# Patient Record
Sex: Female | Born: 1965
Health system: Southern US, Community
[De-identification: ages and names within clinical notes are randomized; demographics above are authoritative.]

## PROBLEM LIST (undated history)

## (undated) DIAGNOSIS — M5126 Other intervertebral disc displacement, lumbar region: Secondary | ICD-10-CM

## (undated) DIAGNOSIS — K5909 Other constipation: Secondary | ICD-10-CM

## (undated) DIAGNOSIS — M8580 Other specified disorders of bone density and structure, unspecified site: Secondary | ICD-10-CM

## (undated) DIAGNOSIS — E669 Obesity, unspecified: Secondary | ICD-10-CM

## (undated) DIAGNOSIS — K219 Gastro-esophageal reflux disease without esophagitis: Secondary | ICD-10-CM

## (undated) DIAGNOSIS — M5416 Radiculopathy, lumbar region: Secondary | ICD-10-CM

## (undated) DIAGNOSIS — Z9884 Bariatric surgery status: Secondary | ICD-10-CM

## (undated) DIAGNOSIS — R079 Chest pain, unspecified: Secondary | ICD-10-CM

## (undated) DIAGNOSIS — M5432 Sciatica, left side: Secondary | ICD-10-CM

## (undated) HISTORY — DX: Radiculopathy, lumbar region: M54.16

## (undated) HISTORY — DX: Morbid (severe) obesity due to excess calories: E66.01

## (undated) HISTORY — DX: Other intervertebral disc displacement, lumbar region: M51.26

## (undated) HISTORY — DX: Gastro-esophageal reflux disease without esophagitis: K21.9

## (undated) HISTORY — DX: Chest pain, unspecified: R07.9

## (undated) HISTORY — DX: Bariatric surgery status: Z98.84

## (undated) HISTORY — PX: LUMBAR DISC SURGERY: SHX700

## (undated) HISTORY — DX: Other specified disorders of bone density and structure, unspecified site: M85.80

## (undated) HISTORY — DX: Obesity, unspecified: E66.9

## (undated) HISTORY — DX: Other constipation: K59.09

## (undated) HISTORY — DX: Sciatica, left side: M54.32

---

## 2012-10-27 HISTORY — PX: BREAST BIOPSY: SHX20

## 2012-10-27 HISTORY — PX: LAPAROSCOPIC GASTRIC SLEEVE RESECTION: SHX5895

## 2013-05-10 DIAGNOSIS — M5432 Sciatica, left side: Secondary | ICD-10-CM

## 2013-05-10 HISTORY — DX: Sciatica, left side: M54.32

## 2013-06-29 DIAGNOSIS — M5126 Other intervertebral disc displacement, lumbar region: Secondary | ICD-10-CM | POA: Insufficient documentation

## 2013-06-29 DIAGNOSIS — M5416 Radiculopathy, lumbar region: Secondary | ICD-10-CM

## 2013-06-29 HISTORY — DX: Other intervertebral disc displacement, lumbar region: M51.26

## 2013-06-29 HISTORY — DX: Radiculopathy, lumbar region: M54.16

## 2013-06-30 DIAGNOSIS — Z9884 Bariatric surgery status: Secondary | ICD-10-CM | POA: Insufficient documentation

## 2013-06-30 HISTORY — DX: Bariatric surgery status: Z98.84

## 2015-03-23 DIAGNOSIS — M8580 Other specified disorders of bone density and structure, unspecified site: Secondary | ICD-10-CM

## 2015-03-23 HISTORY — DX: Other specified disorders of bone density and structure, unspecified site: M85.80

## 2017-03-31 LAB — HM PAP SMEAR: HM PAP: NEGATIVE

## 2018-05-24 ENCOUNTER — Encounter: Payer: Self-pay | Admitting: Family

## 2018-05-24 ENCOUNTER — Ambulatory Visit (INDEPENDENT_AMBULATORY_CARE_PROVIDER_SITE_OTHER): Payer: No Typology Code available for payment source | Admitting: Family

## 2018-05-24 VITALS — BP 97/54 | HR 70 | Temp 98.2°F | Resp 16 | Ht 64.5 in | Wt 217.0 lb

## 2018-05-24 DIAGNOSIS — M519 Unspecified thoracic, thoracolumbar and lumbosacral intervertebral disc disorder: Secondary | ICD-10-CM

## 2018-05-24 DIAGNOSIS — K5909 Other constipation: Secondary | ICD-10-CM | POA: Insufficient documentation

## 2018-05-24 DIAGNOSIS — R079 Chest pain, unspecified: Secondary | ICD-10-CM

## 2018-05-24 DIAGNOSIS — K219 Gastro-esophageal reflux disease without esophagitis: Secondary | ICD-10-CM

## 2018-05-24 DIAGNOSIS — Z0000 Encounter for general adult medical examination without abnormal findings: Secondary | ICD-10-CM

## 2018-05-24 HISTORY — DX: Other constipation: K59.09

## 2018-05-24 HISTORY — DX: Gastro-esophageal reflux disease without esophagitis: K21.9

## 2018-05-24 HISTORY — DX: Chest pain, unspecified: R07.9

## 2018-05-24 HISTORY — DX: Morbid (severe) obesity due to excess calories: E66.01

## 2018-05-24 NOTE — Patient Instructions (Addendum)
You should be contacted about your referral to cardiology for further evaluation of your chest pain. Please let me know if you have not heard from them in the next 3 days.  Please go to the ER if you develop recurrent chest pain.  Increase water, fiber intake- you can also add a probiotic once daily for your constipation.  You can also try adding miralax 1 tablespoon once daily.  Increase to 2 tablespoons once daily if no improvement.

## 2018-05-24 NOTE — Progress Notes (Signed)
Subjective:    Patient ID: Melissa Santiago, female    DOB: 02-Apr-1966, 52 y.o.   MRN: [id number]  HPI  Pt is a 52 year old female who presents today to establish care.  Past medical history is significant for the following:  Morbid obesity- patient is status post gastric sleeve placement about 5 years ago.  GERD- she is maintained on omeprazole 40 mg once daily. Stable on omeprazole.   History of breast  Biopsy 2014- benign per patient. Mammogram due.   History of lumbar discectomy- l4-l5 x 2.  Gets occasional flare up of sciatica overall back pain is been stable.,   Notes chronic constipation- 4 bm's a week.  Has to strain. Tried a 7 day cleans without improvement and also a stool softener. This has been since her 50's.  Reports that she does not eat a lot of high fiber food.   Reports substernal chest pain, had some left arm numbness. Last episode 2 months ago.  This occurred at rest.  She reports that she has had a total of 6 episodes over the last 3 months.  She is wondering if this may be due to her reflux.  Patient  Review of Systems  Constitutional: Negative for unexpected weight change.  HENT: Negative for rhinorrhea.   Respiratory: Negative for cough and shortness of breath.   Cardiovascular: Positive for chest pain. Negative for leg swelling.  Gastrointestinal: Negative for blood in stool.  Genitourinary: Negative for dysuria, frequency and hematuria.  Skin: Negative for rash.  Neurological: Negative for headaches.  Hematological: Negative for adenopathy.  Psychiatric/Behavioral:       Denies depression/anxiety   Past Medical History:  Diagnosis Date  . GERD (gastroesophageal reflux disease)      Social History   Socioeconomic History  . Marital status: Married    Spouse name: [name]  . Number of children: 2  . Years of education: Not on file  . Highest education level: Not on file  Occupational History  . Occupation: Sales promotion account executive  Social Needs  .  Financial resource strain: Not hard at all  . Food insecurity:    Worry: Never true    Inability: Never true  . Transportation needs:    Medical: No    Non-medical: No  Tobacco Use  . Smoking status: Former Smoker    Packs/day: 0.50    Years: 15.00    Pack years: 7.50    Types: Cigarettes    Last attempt to quit: 2006    Years since quitting: 13.5  . Smokeless tobacco: Never Used  Substance and Sexual Activity  . Alcohol use: Yes    Comment: rarely  . Drug use: Never  . Sexual activity: Yes    Partners: Male    Birth control/protection: IUD  Lifestyle  . Physical activity:    Days per week: 0 days    Minutes per session: Not on file  . Stress: Only a little  Relationships  . Social connections:    Talks on phone: More than three times a week    Gets together: Never    Attends religious service: Never    Active member of club or organization: No    Attends meetings of clubs or organizations: Never    Relationship status: Married  . Intimate partner violence:    Fear of current or ex partner: No    Emotionally abused: No    Physically abused: No    Forced sexual activity: No  Other Topics  Concern  . Not on file  Social History Narrative   From [place], Divorced since 2015   Works as a Geophysical data processor for [employer]   2 daughters- one in the [employer]- lives in [place], one in [place]- works as a Pharmacist, hospital   Enjoys spending time with her husband   Has a dog and will adopt another soon.     Past Surgical History:  Procedure Laterality Date  . ANTERIOR CERVICAL DISCECTOMY  2014/2016  . BREAST BIOPSY Left 2014  . LAPAROSCOPIC GASTRIC SLEEVE RESECTION  2014    Family History  Problem Relation Age of Onset  . Arthritis Mother   . Early death Mother   . Mental illness Mother   . Alcohol abuse Father   . Arthritis Father   . Early death Sister   . Thyroid cancer Sister   . Hypertension Sister   . Heart attack Maternal Grandfather   . Uterine cancer  Sister   . Hypertension Sister   . Cervical cancer Sister   . Depression Sister   . Hypertension Sister     Allergies  Allergen Reactions  . Tolmetin     Current Outpatient Medications on File Prior to Visit  Medication Sig Dispense Refill  . Ibuprofen-Famotidine 800-26.6 MG TABS Take by mouth.    Marland Kitchen omeprazole (PRILOSEC) 40 MG capsule TAKE ONE CAPSULE BY MOUTH DAILY     No current facility-administered medications on file prior to visit.     BP (!) 97/54 (BP Location: Right Arm, Patient Position: Sitting, Cuff Size: Large)   Pulse 70   Temp 98.2 F (36.8 C) (Oral)   Resp 16   Ht 5' 4.5" (1.638 m)   Wt 217 lb (98.4 kg)   SpO2 100%   BMI 36.67 kg/m        Objective:   Physical Exam  Constitutional: She is oriented to person, place, and time. She appears well-developed and well-nourished.  HENT:  Head: Normocephalic and atraumatic.  Eyes: No scleral icterus.  Cardiovascular: Normal rate, regular rhythm and normal heart sounds.  No murmur heard. Pulmonary/Chest: Effort normal and breath sounds normal. No respiratory distress. She has no wheezes.  Musculoskeletal: She exhibits no edema.  Neurological: She is alert and oriented to person, place, and time.  Skin: Skin is warm and dry.  Psychiatric: She has a normal mood and affect. Her behavior is normal. Judgment and thought content normal.          Assessment & Plan:  Morbid obesity-she reports that she has maintained her weight for some time now.  She continues to work towards further weight loss.  GERD-stable on proton pump inhibitor continue same.  Atypical chest pain- patient does not have any exertional chest pain however the symptoms that she describes are concerning.  An EKG is performed today and personally reviewed.  There are no acute changes noted on EKG.  She is noted to have a short PR interval.  I have advised her that I would like for her to meet with cardiology for further evaluation and to proceed  directly to the ER should she have recurrent chest pain.  Patient verbalizes understanding.  Chronic constipation-patient is advised as follows:  Increase water, fiber intake- you can also add a probiotic once daily for your constipation.  You can also try adding miralax 1 tablespoon once daily.  Increase to 2 tablespoons once daily if no improvement.

## 2018-05-31 ENCOUNTER — Ambulatory Visit (HOSPITAL_BASED_OUTPATIENT_CLINIC_OR_DEPARTMENT_OTHER)
Admission: RE | Admit: 2018-05-31 | Discharge: 2018-05-31 | Disposition: A | Discharge status: Home / Self Care | Payer: PRIVATE HEALTH INSURANCE | Source: Ambulatory Visit | Attending: Family | Admitting: Family

## 2018-05-31 ENCOUNTER — Encounter (HOSPITAL_BASED_OUTPATIENT_CLINIC_OR_DEPARTMENT_OTHER): Payer: Self-pay | Admitting: Radiology

## 2018-05-31 DIAGNOSIS — Z0000 Encounter for general adult medical examination without abnormal findings: Secondary | ICD-10-CM | POA: Insufficient documentation

## 2018-06-08 ENCOUNTER — Encounter: Payer: Self-pay | Admitting: *Deleted

## 2018-06-10 ENCOUNTER — Encounter: Payer: Self-pay | Admitting: Cardiology

## 2018-06-10 ENCOUNTER — Ambulatory Visit (INDEPENDENT_AMBULATORY_CARE_PROVIDER_SITE_OTHER): Payer: No Typology Code available for payment source | Admitting: Cardiology

## 2018-06-10 VITALS — BP 98/62 | HR 70 | Ht 64.5 in | Wt 219.0 lb

## 2018-06-10 DIAGNOSIS — R0602 Shortness of breath: Secondary | ICD-10-CM

## 2018-06-10 DIAGNOSIS — R7989 Other specified abnormal findings of blood chemistry: Secondary | ICD-10-CM | POA: Diagnosis not present

## 2018-06-10 DIAGNOSIS — Z8249 Family history of ischemic heart disease and other diseases of the circulatory system: Secondary | ICD-10-CM

## 2018-06-10 DIAGNOSIS — R079 Chest pain, unspecified: Secondary | ICD-10-CM

## 2018-06-10 DIAGNOSIS — K219 Gastro-esophageal reflux disease without esophagitis: Secondary | ICD-10-CM

## 2018-06-10 MED ORDER — MELOXICAM 15 MG PO TABS: 15.0000 mg | ORAL_TABLET | Freq: Every day | ORAL | 0 refills | Status: DC

## 2018-06-10 NOTE — Patient Instructions (Addendum)
Medication Instructions:  Your physician has recommended you make the following change in your medication:   START meloxicam (mobic) 15 mg daily for 2 weeks. If symptoms still persist, take this medication for a full 30 days.   Labwork: Your physician recommends that you return for lab work today: BMP.   Testing/Procedures: Non-Cardiac CT Angiography (CTA), is a special type of CT scan that uses a computer to produce multi-dimensional views of major blood vessels throughout the body. In CT angiography, a contrast material is injected through an IV to help visualize the blood vessels  Follow-Up: Your physician recommends that you schedule a follow-up appointment as needed if symptoms worsen or fail to improve.   If you need a refill on your cardiac medications before your next appointment, please call your pharmacy.   Thank you for choosing [facility]! Robyne Peers, RN [phone]        Costochondritis Costochondritis is swelling and irritation (inflammation) of the tissue (cartilage) that connects your ribs to your breastbone (sternum). This causes pain in the front of your chest. Usually, the pain:  Starts gradually.  Is in more than one rib.  This condition usually goes away on its own over time. Follow these instructions at home:  Do not do anything that makes your pain worse.  If directed, put ice on the painful area: ? Put ice in a plastic bag. ? Place a towel between your skin and the bag. ? Leave the ice on for 20 minutes, 2-3 times a day.  If directed, put heat on the affected area as often as told by your doctor. Use the heat source that your doctor tells you to use, such as a moist heat pack or a heating pad. ? Place a towel between your skin and the heat source. ? Leave the heat on for 20-30 minutes. ? Take off the heat if your skin turns bright red. This is very important if you cannot feel pain, heat, or cold. You may have a greater risk of  getting burned.  Take over-the-counter and prescription medicines only as told by your doctor.  Return to your normal activities as told by your doctor. Ask your doctor what activities are safe for you.  Keep all follow-up visits as told by your doctor. This is important. Contact a doctor if:  You have chills or a fever.  Your pain does not go away or it gets worse.  You have a cough that does not go away. Get help right away if:  You are short of breath. This information is not intended to replace advice given to you by your health care provider. Make sure you discuss any questions you have with your health care provider. Document Released: 03/31/2008 Document Revised: 05/02/2016 Document Reviewed: 02/06/2016 Elsevier Interactive Patient Education  2018 [employer].    Meloxicam tablets What is this medicine? MELOXICAM (mel OX i cam) is a non-steroidal anti-inflammatory drug (NSAID). It is used to reduce swelling and to treat pain. It may be used for osteoarthritis, rheumatoid arthritis, or juvenile rheumatoid arthritis. This medicine may be used for other purposes; ask your health care provider or pharmacist if you have questions. COMMON BRAND NAME(S): Mobic What should I tell my health care provider before I take this medicine? They need to know if you have any of these conditions: -bleeding disorders -cigarette smoker -coronary artery bypass graft (CABG) surgery within the past 2 weeks -drink more than 3 alcohol-containing drinks per day -heart disease -high blood  pressure -history of stomach bleeding -kidney disease -liver disease -lung or breathing disease, like asthma -stomach or intestine problems -an unusual or allergic reaction to meloxicam, aspirin, other NSAIDs, other medicines, foods, dyes, or preservatives -pregnant or trying to get pregnant -breast-feeding How should I use this medicine? Take this medicine by mouth with a full glass of water. Follow the  directions on the prescription label. You can take it with or without food. If it upsets your stomach, take it with food. Take your medicine at regular intervals. Do not take it more often than directed. Do not stop taking except on your doctor's advice. A special MedGuide will be given to you by the pharmacist with each prescription and refill. Be sure to read this information carefully each time. Talk to your pediatrician regarding the use of this medicine in children. While this drug may be prescribed for selected conditions, precautions do apply. Patients over 43 years old may have a stronger reaction and need a smaller dose. Overdosage: If you think you have taken too much of this medicine contact a poison control center or emergency room at once. NOTE: This medicine is only for you. Do not share this medicine with others. What if I miss a dose? If you miss a dose, take it as soon as you can. If it is almost time for your next dose, take only that dose. Do not take double or extra doses. What may interact with this medicine? Do not take this medicine with any of the following medications: -cidofovir -ketorolac This medicine may also interact with the following medications: -aspirin and aspirin-like medicines -certain medicines for blood pressure, heart disease, irregular heart beat -certain medicines for depression, anxiety, or psychotic disturbances -certain medicines that treat or prevent blood clots like warfarin, enoxaparin, dalteparin, apixaban, dabigatran, rivaroxaban -cyclosporine -digoxin -diuretics -methotrexate -other NSAIDs, medicines for pain and inflammation, like ibuprofen and naproxen -pemetrexed This list may not describe all possible interactions. Give your health care provider a list of all the medicines, herbs, non-prescription drugs, or dietary supplements you use. Also tell them if you smoke, drink alcohol, or use illegal drugs. Some items may interact with your  medicine. What should I watch for while using this medicine? Tell your doctor or healthcare professional if your symptoms do not start to get better or if they get worse. Do not take other medicines that contain aspirin, ibuprofen, or naproxen with this medicine. Side effects such as stomach upset, nausea, or ulcers may be more likely to occur. Many medicines available without a prescription should not be taken with this medicine. This medicine can cause ulcers and bleeding in the stomach and intestines at any time during treatment. This can happen with no warning and may cause death. There is increased risk with taking this medicine for a long time. Smoking, drinking alcohol, older age, and poor health can also increase risks. Call your doctor right away if you have stomach pain or blood in your vomit or stool. This medicine does not prevent heart attack or stroke. In fact, this medicine may increase the chance of a heart attack or stroke. The chance may increase with longer use of this medicine and in people who have heart disease. If you take aspirin to prevent heart attack or stroke, talk with your doctor or health care professional. What side effects may I notice from receiving this medicine? Side effects that you should report to your doctor or health care professional as soon as possible: -allergic reactions like  skin rash, itching or hives, swelling of the face, lips, or tongue -nausea, vomiting -signs and symptoms of a blood clot such as breathing problems; changes in vision; chest pain; severe, sudden headache; pain, swelling, warmth in the leg; trouble speaking; sudden numbness or weakness of the face, arm, or leg -signs and symptoms of bleeding such as bloody or black, tarry stools; red or dark-brown urine; spitting up blood or brown material that looks like coffee grounds; red spots on the skin; unusual bruising or bleeding from the eye, gums, or nose -signs and symptoms of liver injury like  dark yellow or brown urine; general ill feeling or flu-like symptoms; light-colored stools; loss of appetite; nausea; right upper belly pain; unusually weak or tired; yellowing of the eyes or skin -signs and symptoms of stroke like changes in vision; confusion; trouble speaking or understanding; severe headaches; sudden numbness or weakness of the face, arm, or leg; trouble walking; dizziness; loss of balance or coordination Side effects that usually do not require medical attention (report to your doctor or health care professional if they continue or are bothersome): -constipation -diarrhea -gas This list may not describe all possible side effects. Call your doctor for medical advice about side effects. You may report side effects to FDA at 1-800-FDA-1088. Where should I keep my medicine? Keep out of the reach of children. Store at room temperature between 15 and 30 degrees C (59 and 86 degrees F). Throw away any unused medicine after the expiration date. NOTE: This sheet is a summary. It may not cover all possible information. If you have questions about this medicine, talk to your doctor, pharmacist, or health care provider.  2018 Elsevier/Gold Standard (2015-11-14 19:28:16)

## 2018-06-10 NOTE — Progress Notes (Signed)
Cardiology Office Note:    Date:  06/11/2018   ID:  Melissa Santiago, DOB 24-Oct-1966, MRN [id number]  PCP:  [name], NP  Cardiologist:  Shirlee More, MD   Referring MD: [name], NP  ASSESSMENT:    1. Chest pain in adult   2. High serum high density lipoprotein (HDL)   3. Gastroesophageal reflux disease, esophagitis presence not specified   4. Family history of DVT   5. Family history of pulmonary embolism   6. Shortness of breath    PLAN:    In order of problems listed above:  1. Her clinical presentation is most consistent with costochondral pain reproducible on physical examination.  We are going to go ahead and initiate a nonsteroidal anti-inflammatory drug Mobic 15 mg daily for the next 2 weeks and if unimproved continue for 1 month.  She is taking a PPI for gastric protection.  If unimproved referral for PT modalities.  I do not tHink she requires an ischemia evaluation 2. She has a favorable lipid profile with a very high HDL 3. Continue her PPI 4. With shortness of breath and chest pain further evaluation referred for CTA regarding pulmonary embolism  Next appointment 4 weeks   Medication Adjustments/Labs and Tests Ordered: Current medicines are reviewed at length with the patient today.  Concerns regarding medicines are outlined above.  Orders Placed This Encounter  Procedures  . CT ANGIO CHEST PE W OR WO CONTRAST  . Basic Metabolic Panel (BMET)   Meds ordered this encounter  Medications  . meloxicam (MOBIC) 15 MG tablet    Sig: Take 1 tablet (15 mg total) by mouth daily.    Dispense:  30 tablet    Refill:  0     Chief Complaint  Patient presents with  . Establish Care    Per pt she has no had any chest pain since ov with referring physican     History of Present Illness:    Melissa Santiago is a 52 y.o. female with a history of morbid obesity and bariatric surgery 5 years ago and gastroesophageal reflux disease who is being seen  today for the evaluation of chest pain at the request of [name], NP. In the last 6 months she has had intermittent episodes of left chest discomfort is mild to moderate in severity sharp in nature brief localized to costochondral junction no radiation shortness of breath diaphoresis.  She has a strong family history of venous thromboembolism and has had associated shortness of breath.  She has chronic pain in the left lower extremity from radiculopathy is no diagnosis of preceding venous thromboembolism.  She has no history of congenital rheumatic heart disease.  Some of her shortness of breath has been nocturnal which was awaken her from her sleep.  No cough or wheezing.  Past Medical History:  Diagnosis Date  . Acute lumbar radiculopathy 06/29/2013  . Chest pain in adult 05/24/2018  . Chronic constipation 05/24/2018  . Gastroesophageal reflux disease 05/24/2018  . GERD (gastroesophageal reflux disease)   . Left sided sciatica 05/10/2013  . Lumbar disc herniation 06/29/2013  . Morbid obesity ([place]) 05/24/2018  . Osteopenia 03/23/2015  . S/P bariatric surgery 06/30/2013    Past Surgical History:  Procedure Laterality Date  . BREAST BIOPSY Left 2014  . [place] RESECTION  2014  . LUMBAR DISC SURGERY  2014/2016    Current Medications: Current Meds  Medication Sig  . Ibuprofen-Famotidine 800-26.6 MG TABS Take by mouth.  Marland Kitchen  omeprazole (PRILOSEC) 40 MG capsule TAKE ONE CAPSULE BY MOUTH DAILY     Allergies:   Tolmetin   Social History   Socioeconomic History  . Marital status: Married    Spouse name: [name]  . Number of children: 2  . Years of education: Not on file  . Highest education level: Not on file  Occupational History  . Occupation: Sales promotion account executive  Social Needs  . Financial resource strain: Not hard at all  . Food insecurity:    Worry: Never true    Inability: Never true  . Transportation needs:    Medical: No    Non-medical: No  Tobacco Use    . Smoking status: Former Smoker    Packs/day: 0.50    Years: 15.00    Pack years: 7.50    Types: Cigarettes    Last attempt to quit: 2006    Years since quitting: 13.6  . Smokeless tobacco: Never Used  Substance and Sexual Activity  . Alcohol use: Yes    Comment: rarely  . Drug use: Never  . Sexual activity: Yes    Partners: Male    Birth control/protection: IUD  Lifestyle  . Physical activity:    Days per week: 0 days    Minutes per session: Not on file  . Stress: Only a little  Relationships  . Social connections:    Talks on phone: More than three times a week    Gets together: Never    Attends religious service: Never    Active member of club or organization: No    Attends meetings of clubs or organizations: Never    Relationship status: Married  Other Topics Concern  . Not on file  Social History Narrative   From [place], Divorced since 2015   Works as a Geophysical data processor for [employer]   2 daughters- one in the [employer]- lives in [place], one in [place]- works as a Pharmacist, hospital   Enjoys spending time with her husband   Has a dog and will adopt another soon.      Family History: The patient's family history includes Alcohol abuse in her father; Arthritis in her father and mother; Cervical cancer in her sister; Depression in her sister; Early death in her mother and sister; Heart attack in her maternal grandfather; Hypertension in her sister, sister, and sister; Mental illness in her mother; Thyroid cancer in her sister; Uterine cancer in her sister.  ROS:   Review of Systems  Constitution: Negative.  HENT: Negative.   Eyes: Negative.   Cardiovascular: Positive for chest pain.  Respiratory: Positive for shortness of breath.   Endocrine: Negative.   Hematologic/Lymphatic: Negative.   Musculoskeletal: Positive for joint pain.  Gastrointestinal: Negative.   Genitourinary: Negative.   Neurological: Negative.   Psychiatric/Behavioral: Negative.    Allergic/Immunologic: Negative.    Please see the history of present illness.     All other systems reviewed and are negative.  EKGs/Labs/Other Studies Reviewed:    The following studies were reviewed today:   EKG: I did not repeat an EKG today with recent EKG normal independently reviewed  EKG 05/24/2018 independently reviewed sinus rhythm normal Recent Labs: 06/10/2018: BUN 18; Creatinine, Ser 0.75; Potassium 4.3; Sodium 141  Recent Lipid Panel lipid profile 10/21/2016 cholesterol 219 HDL 94 LDL 110 No results found for: CHOL, TRIG, HDL, CHOLHDL, VLDL, LDLCALC, LDLDIRECT  Physical Exam:    VS:  BP 98/62 (BP Location: Left Arm, Patient Position: Sitting, Cuff Size:  Normal)   Pulse 70   Ht 5' 4.5" (1.638 m)   Wt 219 lb (99.3 kg)   SpO2 96%   BMI 37.01 kg/m     Wt Readings from Last 3 Encounters:  06/10/18 219 lb (99.3 kg)  05/24/18 217 lb (98.4 kg)     GEN:  Well nourished, well developed in no acute distress HEENT: Normal NECK: No JVD; No carotid bruits LYMPHATICS: No lymphadenopathy CARDIAC: tender to palpation L CCJ RRR, no murmurs, rubs, gallops RESPIRATORY:  Clear to auscultation without rales, wheezing or rhonchi  ABDOMEN: Soft, non-tender, non-distended MUSCULOSKELETAL:  No edema; No deformity  SKIN: Warm and dry NEUROLOGIC:  Alert and oriented x 3 PSYCHIATRIC:  Normal affect    Signed, Shirlee More, MD  06/11/2018 8:16 AM    [facility]

## 2018-06-11 LAB — BASIC METABOLIC PANEL
BUN/Creatinine Ratio: 24 — ABNORMAL HIGH (ref 9–23)
BUN: 18 mg/dL (ref 6–24)
CHLORIDE: 106 mmol/L (ref 96–106)
CO2: 21 mmol/L (ref 20–29)
CREATININE: 0.75 mg/dL (ref 0.57–1.00)
Calcium: 9.4 mg/dL (ref 8.7–10.2)
GFR calc Af Amer: 107 mL/min/{1.73_m2} (ref >59)
GFR calc non Af Amer: 93 mL/min/{1.73_m2} (ref >59)
Glucose: 87 mg/dL (ref 65–99)
Potassium: 4.3 mmol/L (ref 3.5–5.2)
SODIUM: 141 mmol/L (ref 134–144)

## 2018-06-14 ENCOUNTER — Ambulatory Visit (HOSPITAL_BASED_OUTPATIENT_CLINIC_OR_DEPARTMENT_OTHER): Payer: No Typology Code available for payment source

## 2018-06-22 ENCOUNTER — Telehealth: Payer: Self-pay | Admitting: Emergency Medicine

## 2018-06-22 DIAGNOSIS — R079 Chest pain, unspecified: Secondary | ICD-10-CM

## 2018-06-22 NOTE — Telephone Encounter (Signed)
Per Dr. Joya Gaskins staff message patient needs d-dimer before her ct. Patient made aware and plans to have this drawn tomorrow.

## 2018-07-02 ENCOUNTER — Telehealth: Payer: Self-pay

## 2018-07-02 NOTE — Telephone Encounter (Signed)
Reached out to patient to see if she had came to the office to have DDimer as she was instructed by [facility] on 06-22-18, but there was no answer.  Left message on patients voicemail to contact office regarding lab work.

## 2018-07-26 ENCOUNTER — Ambulatory Visit (INDEPENDENT_AMBULATORY_CARE_PROVIDER_SITE_OTHER): Payer: No Typology Code available for payment source | Admitting: Family

## 2018-07-26 ENCOUNTER — Encounter: Payer: Self-pay | Admitting: Family

## 2018-07-26 VITALS — BP 110/70 | HR 73 | Temp 99.1°F | Resp 16 | Ht 65.0 in | Wt 223.0 lb

## 2018-07-26 DIAGNOSIS — N951 Menopausal and female climacteric states: Secondary | ICD-10-CM | POA: Diagnosis not present

## 2018-07-26 DIAGNOSIS — Z23 Encounter for immunization: Secondary | ICD-10-CM

## 2018-07-26 DIAGNOSIS — Z0000 Encounter for general adult medical examination without abnormal findings: Secondary | ICD-10-CM | POA: Diagnosis not present

## 2018-07-26 LAB — HEPATIC FUNCTION PANEL
ALK PHOS: 68 U/L (ref 39–117)
ALT: 21 U/L (ref 0–35)
AST: 22 U/L (ref 0–37)
Albumin: 3.9 g/dL (ref 3.5–5.2)
Bilirubin, Direct: 0.1 mg/dL (ref 0.0–0.3)
Total Bilirubin: 0.5 mg/dL (ref 0.2–1.2)
Total Protein: 6.2 g/dL (ref 6.0–8.3)

## 2018-07-26 LAB — CBC WITH DIFFERENTIAL/PLATELET
BASOS ABS: 0 10*3/uL (ref 0.0–0.1)
Basophils Relative: 0.6 % (ref 0.0–3.0)
Eosinophils Absolute: 0.1 10*3/uL (ref 0.0–0.7)
Eosinophils Relative: 1.1 % (ref 0.0–5.0)
HCT: 38.3 % (ref 36.0–46.0)
Hemoglobin: 12.9 g/dL (ref 12.0–15.0)
LYMPHS ABS: 1.6 10*3/uL (ref 0.7–4.0)
Lymphocytes Relative: 29.8 % (ref 12.0–46.0)
MCHC: 33.8 g/dL (ref 30.0–36.0)
MCV: 82.6 fl (ref 78.0–100.0)
MONO ABS: 0.4 10*3/uL (ref 0.1–1.0)
Monocytes Relative: 7 % (ref 3.0–12.0)
NEUTROS ABS: 3.3 10*3/uL (ref 1.4–7.7)
NEUTROS PCT: 61.5 % (ref 43.0–77.0)
PLATELETS: 182 10*3/uL (ref 150.0–400.0)
RBC: 4.63 Mil/uL (ref 3.87–5.11)
RDW: 14.1 % (ref 11.5–15.5)
WBC: 5.4 10*3/uL (ref 4.0–10.5)

## 2018-07-26 LAB — LIPID PANEL
Cholesterol: 199 mg/dL (ref 0–200)
HDL: 72.8 mg/dL (ref >39.00)
LDL Cholesterol: 110 mg/dL — ABNORMAL HIGH (ref 0–99)
NONHDL: 125.91
Total CHOL/HDL Ratio: 3
Triglycerides: 80 mg/dL (ref 0.0–149.0)
VLDL: 16 mg/dL (ref 0.0–40.0)

## 2018-07-26 LAB — TSH: TSH: 1.13 u[IU]/mL (ref 0.35–4.50)

## 2018-07-26 MED ORDER — OMEPRAZOLE 40 MG PO CPDR: 40.0000 mg | DELAYED_RELEASE_CAPSULE | Freq: Every day | ORAL | 1 refills | Status: DC

## 2018-07-26 MED ORDER — VENLAFAXINE HCL ER 37.5 MG PO CP24: ORAL_CAPSULE | ORAL | 0 refills | Status: DC

## 2018-07-26 NOTE — Patient Instructions (Signed)
Please complete lab work prior to leaving. Work on [employer], exercise, weight loss. Begin effexor.

## 2018-07-26 NOTE — Progress Notes (Signed)
Subjective:    Patient ID: Melissa Santiago, female    DOB: [birth year], 52 y.o.   MRN: [id number]  HPI  Patient presents today for complete physical.  Immunizations: tetanus 2013, flu shot today Diet: "out of control"  Menopausal symptoms. Feels like she is retaining water.  LMP 2.5 months ago. Exercise:  No formal exercise, scared of the GYM due to her back.   Colonoscopy: due Pap Smear: 1.5 yrs ago- done with GYN.  Reports that this was done in [place] 03/31/17-negative Mammogram:05/31/18 Vision: up to date Dental: scheduled for 08/26/18  Feels irritable.  Stress eating.  Having hot flashes.  Has not had a period in several months.  "I always feel like I have PMS."     Review of Systems  Constitutional: Negative for unexpected weight change.  HENT: Negative for hearing loss and rhinorrhea.   Eyes: Negative for visual disturbance.  Respiratory: Negative for cough and shortness of breath.   Cardiovascular:       Occasional swelling in feet in AM  Gastrointestinal: Negative for constipation and diarrhea.  Genitourinary: Positive for frequency. Negative for dysuria.  Musculoskeletal: Positive for arthralgias and back pain.  Skin: Negative for rash.  Neurological: Negative for headaches.  Hematological: Negative for adenopathy.  Psychiatric/Behavioral:       See HPI   Past Medical History:  Diagnosis Date  . Acute lumbar radiculopathy 06/29/2013  . Chest pain in adult 05/24/2018  . Chronic constipation 05/24/2018  . Gastroesophageal reflux disease 05/24/2018  . GERD (gastroesophageal reflux disease)   . Left sided sciatica 05/10/2013  . Lumbar disc herniation 06/29/2013  . Morbid obesity ([place]) 05/24/2018  . Osteopenia 03/23/2015  . S/P bariatric surgery 06/30/2013     Social History   Socioeconomic History  . Marital status: Married    Spouse name: [name]  . Number of children: 2  . Years of education: Not on file  . Highest education level: Not on file  Occupational History  .  Occupation: Sales promotion account executive  Social Needs  . Financial resource strain: Not hard at all  . Food insecurity:    Worry: Never true    Inability: Never true  . Transportation needs:    Medical: No    Non-medical: No  Tobacco Use  . Smoking status: Former Smoker    Packs/day: 0.50    Years: 15.00    Pack years: 7.50    Types: Cigarettes    Last attempt to quit: 2006    Years since quitting: 13.7  . Smokeless tobacco: Never Used  Substance and Sexual Activity  . Alcohol use: Yes    Comment: rarely  . Drug use: Never  . Sexual activity: Yes    Partners: Male    Birth control/protection: IUD  Lifestyle  . Physical activity:    Days per week: 0 days    Minutes per session: Not on file  . Stress: Only a little  Relationships  . Social connections:    Talks on phone: More than three times a week    Gets together: Never    Attends religious service: Never    Active member of club or organization: No    Attends meetings of clubs or organizations: Never    Relationship status: Married  . Intimate partner violence:    Fear of current or ex partner: No    Emotionally abused: No    Physically abused: No    Forced sexual activity: No  Other Topics Concern  .  Not on file  Social History Narrative   From [place], Divorced since 2015   Works as a Geophysical data processor for [employer]   2 daughters- one in the [employer]- lives in [place], one in [place]- works as a Pharmacist, hospital   Enjoys spending time with her husband   Has a dog and will adopt another soon.     Past Surgical History:  Procedure Laterality Date  . BREAST BIOPSY Left 2014  . [place] RESECTION  2014  . LUMBAR DISC SURGERY  2014/2016    Family History  Problem Relation Age of Onset  . Arthritis Mother   . Early death Mother   . Mental illness Mother   . Alcohol abuse Father   . Arthritis Father   . Early death Sister   . Thyroid cancer Sister   . Hypertension Sister   . Heart attack  Maternal Grandfather   . Uterine cancer Sister   . Hypertension Sister   . Cervical cancer Sister   . Depression Sister   . Hypertension Sister     Allergies  Allergen Reactions  . Tolmetin     Patient reports taking OTC ibuprofen and is not allergic     Current Outpatient Medications on File Prior to Visit  Medication Sig Dispense Refill  . Ibuprofen-Famotidine 800-26.6 MG TABS Take by mouth.    . meloxicam (MOBIC) 15 MG tablet Take 1 tablet (15 mg total) by mouth daily. 30 tablet 0   No current facility-administered medications on file prior to visit.     BP 110/70 (BP Location: Left Arm, Patient Position: Sitting, Cuff Size: Small)   Pulse 73   Temp 99.1 F (37.3 C) (Oral)   Resp 16   Ht 5\' 5"  (1.651 m)   Wt 223 lb (101.2 kg)   SpO2 96%   BMI 37.11 kg/m       Objective:   Physical Exam  Physical Exam  Constitutional: She is oriented to person, place, and time. She appears well-developed and well-nourished. No distress.  HENT:  Head: Normocephalic and atraumatic.  Right Ear: Tympanic membrane and ear canal normal.  Left Ear: L TM occluded by cerumen.  Mouth/Throat: Oropharynx is clear and moist.  Eyes: Pupils are equal, round, and reactive to light. No scleral icterus.  Neck: Normal range of motion. No thyromegaly present.  Cardiovascular: Normal rate and regular rhythm.   No murmur heard. Pulmonary/Chest: Effort normal and breath sounds normal. No respiratory distress. He has no wheezes. She has no rales. She exhibits no tenderness.  Abdominal: Soft. Bowel sounds are normal. She exhibits no distension and no mass. There is no tenderness. There is no rebound and no guarding.  Musculoskeletal: She exhibits no edema.  Lymphadenopathy:    She has no cervical adenopathy.  Neurological: She is alert and oriented to person, place, and time. She has normal patellar reflexes. She exhibits normal muscle tone. Coordination normal.  Skin: Skin is warm and dry.    Psychiatric: She has a normal mood and affect. Her behavior is normal. Judgment and thought content normal.  Breasts: Examined lying Right: Without masses, retractions, discharge or axillary adenopathy.  Left: Without masses, retractions, discharge or axillary adenopathy.  Pelvic: deferred.         Assessment & Plan:  Preventative care-  Tdap/flu shot up to date. Discussed healthy diet, low impact exercise such as walking and weight loss.  Pap mammo up to date. Due for colo, referral placed. Obtain  routine lab work.   Menopausal syndrome- trial of effexor to help with hot flashes/mood swings.         Assessment & Plan:

## 2018-08-30 ENCOUNTER — Ambulatory Visit: Payer: No Typology Code available for payment source | Admitting: Family

## 2018-08-30 DIAGNOSIS — Z0289 Encounter for other administrative examinations: Secondary | ICD-10-CM

## 2018-09-02 ENCOUNTER — Encounter: Payer: Self-pay | Admitting: Family

## 2018-09-20 ENCOUNTER — Ambulatory Visit: Payer: No Typology Code available for payment source | Admitting: Family

## 2018-10-11 ENCOUNTER — Encounter: Payer: Self-pay | Admitting: Family

## 2018-12-13 ENCOUNTER — Encounter: Payer: Self-pay | Admitting: Gastroenterology

## 2018-12-13 ENCOUNTER — Ambulatory Visit: Payer: PRIVATE HEALTH INSURANCE | Admitting: Family

## 2018-12-13 ENCOUNTER — Ambulatory Visit: Payer: No Typology Code available for payment source | Admitting: Family

## 2018-12-13 VITALS — BP 116/73 | HR 76 | Temp 97.8°F | Resp 16 | Ht 64.0 in | Wt 224.0 lb

## 2018-12-13 DIAGNOSIS — F418 Other specified anxiety disorders: Secondary | ICD-10-CM | POA: Diagnosis not present

## 2018-12-13 DIAGNOSIS — K2100 Gastro-esophageal reflux disease with esophagitis, without bleeding: Secondary | ICD-10-CM

## 2018-12-13 MED ORDER — OMEPRAZOLE 40 MG PO CPDR: 40.0000 mg | DELAYED_RELEASE_CAPSULE | Freq: Every day | ORAL | 0 refills | Status: DC

## 2018-12-13 NOTE — Patient Instructions (Signed)
Please increase omeprazole to twice daily. You should be contacted about your referral to GI. Please schedule an appointment with a counselor.

## 2018-12-13 NOTE — Progress Notes (Signed)
Subjective:    Patient ID: Melissa Santiago, female    DOB: 11-20-1965, 53 y.o.   MRN: [id number]  HPI  Patient is a 53 yr old female who presents today with two concerns:  1) GERD- reports that she flew home from a conference 2 weeks ago.  Reports that she was nervous due to turbulence.  vomited 3 times.  Reports that she has to sleep sitting up or she has acid coming up. Reports that she takes 600mg  of ibuprofen 600mg  twice weekly. Uses for back/feet/joint pain.  Rare use of meloxicam. Reports that she takes omeprazole daily.  Worse when she lays down.  Feels like she might even aspirate the acid.  Has pain up her esophagus up into her throat.   2) Stress- daughter and step son are both getting married. Husband is in treatment for leukemia.  Work stress, financial stress.  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 Just feels like she has a lot on her plate.   Review of Systems    see HPI  Past Medical History:  Diagnosis Date  . Acute lumbar radiculopathy 06/29/2013  . Chest pain in adult 05/24/2018  . Chronic constipation 05/24/2018  . Gastroesophageal reflux disease 05/24/2018  . GERD (gastroesophageal reflux disease)   . Left sided sciatica 05/10/2013  . Lumbar disc herniation 06/29/2013  . Morbid obesity ([place]) 05/24/2018  . Osteopenia 03/23/2015  . S/P bariatric surgery 06/30/2013     Social History   Socioeconomic History  . Marital status: Married    Spouse name: [name]  . Number of children: 2  . Years of education: Not on file  . Highest education level: Not on file  Occupational History  . Occupation: Sales promotion account executive  Social Needs  . Financial resource strain: Not hard at all  . Food insecurity:    Worry: Never true    Inability: Never true  . Transportation needs:    Medical: No    Non-medical: No  Tobacco Use  . Smoking status: Former Smoker    Packs/day: 0.50    Years: 15.00    Pack years: 7.50    Types: Cigarettes    Last attempt to  quit: 2006    Years since quitting: 14.1  . Smokeless tobacco: Never Used  Substance and Sexual Activity  . Alcohol use: Yes    Comment: rarely  . Drug use: Never  . Sexual activity: Yes    Partners: Male    Birth control/protection: I.U.D.  Lifestyle  . Physical activity:    Days per week: 0 days    Minutes per session: Not on file  . Stress: Only a little  Relationships  . Social connections:    Talks on phone: More than three times a week    Gets together: Never    Attends religious service: Never    Active member of club or organization: No    Attends meetings of clubs or organizations: Never    Relationship status: Married  . Intimate partner violence:    Fear of current or ex partner: No    Emotionally abused: No    Physically abused: No    Forced sexual activity: No  Other Topics Concern  . Not on file  Social History Narrative   From [place], Divorced since 2015   Works as a Geophysical data processor for [employer]   2 daughters- one in the [employer]- lives in [place], one in [place]- works  as a Pharmacist, hospital   Enjoys spending time with her husband   Has a dog and will adopt another soon.     Past Surgical History:  Procedure Laterality Date  . BREAST BIOPSY Left 2014  . [place] RESECTION  2014  . LUMBAR DISC SURGERY  2014/2016    Family History  Problem Relation Age of Onset  . Arthritis Mother   . Early death Mother   . Mental illness Mother   . Alcohol abuse Father   . Arthritis Father   . Early death Sister   . Thyroid cancer Sister   . Hypertension Sister   . Heart attack Maternal Grandfather   . Uterine cancer Sister   . Hypertension Sister   . Cervical cancer Sister   . Depression Sister   . Hypertension Sister     Allergies  Allergen Reactions  . Tolmetin     Patient reports taking OTC ibuprofen and is not allergic     No current outpatient medications on file prior to visit.   No current facility-administered  medications on file prior to visit.     BP 116/73 (BP Location: Right Arm, Patient Position: Sitting, Cuff Size: Small)   Pulse 76   Temp 97.8 F (36.6 C) (Oral)   Resp 16   Ht 5\' 4"  (1.626 m)   Wt 224 lb (101.6 kg)   SpO2 99%   BMI 38.45 kg/m     Objective:   Physical Exam Constitutional:      Appearance: She is well-developed.  Neck:     Musculoskeletal: Neck supple.     Thyroid: No thyromegaly.  Cardiovascular:     Rate and Rhythm: Normal rate and regular rhythm.     Heart sounds: Normal heart sounds. No murmur.  Pulmonary:     Effort: Pulmonary effort is normal. No respiratory distress.     Breath sounds: Normal breath sounds. No wheezing.  Skin:    General: Skin is warm and dry.  Neurological:     Mental Status: She is alert and oriented to person, place, and time.  Psychiatric:        Behavior: Behavior normal.        Thought Content: Thought content normal.        Judgment: Judgment normal.           Assessment & Plan:  GERD- uncontrolled.  Will increase omeprazole bid and refer to GI for further evaluation.  Anxiety/depression- scored poorly on GAD-7 and PHQ-9. I really think that her symptoms are stress related. Advised pt to consider counseling.    GAD 7 : Generalized Anxiety Score 12/13/2018  Nervous, Anxious, on Edge 2  Control/stop worrying 3  Worry too much - different things 3  Trouble relaxing 3  Restless 2  Easily annoyed or irritable 3  Afraid - awful might happen 3  Total GAD 7 Score 19  Anxiety Difficulty Somewhat difficult      Office Visit from 12/13/2018 in [facility]  PHQ-9 Total Score  19      A total of 25  minutes were spent face-to-face with the patient during this encounter and over half of that time was spent on counseling and coordination of care. The patient was counseled on anxiety/depression, stress management.

## 2018-12-21 ENCOUNTER — Encounter: Payer: Self-pay | Admitting: Gastroenterology

## 2018-12-21 ENCOUNTER — Ambulatory Visit (INDEPENDENT_AMBULATORY_CARE_PROVIDER_SITE_OTHER): Payer: PRIVATE HEALTH INSURANCE | Admitting: Gastroenterology

## 2018-12-21 VITALS — BP 114/76 | HR 93 | Ht 64.5 in | Wt 225.5 lb

## 2018-12-21 DIAGNOSIS — R1110 Vomiting, unspecified: Secondary | ICD-10-CM | POA: Diagnosis not present

## 2018-12-21 DIAGNOSIS — R1010 Upper abdominal pain, unspecified: Secondary | ICD-10-CM

## 2018-12-21 DIAGNOSIS — Z1211 Encounter for screening for malignant neoplasm of colon: Secondary | ICD-10-CM | POA: Diagnosis not present

## 2018-12-21 DIAGNOSIS — K219 Gastro-esophageal reflux disease without esophagitis: Secondary | ICD-10-CM | POA: Diagnosis not present

## 2018-12-21 DIAGNOSIS — Z1212 Encounter for screening for malignant neoplasm of rectum: Secondary | ICD-10-CM

## 2018-12-21 DIAGNOSIS — Z903 Acquired absence of stomach [part of]: Secondary | ICD-10-CM

## 2018-12-21 MED ORDER — SOD PICOSULFATE-MAG OX-CIT ACD 10-3.5-12 MG-GM -GM/160ML PO SOLN: 1.0000 | ORAL | 0 refills | Status: DC

## 2018-12-21 NOTE — Patient Instructions (Addendum)
If you are age 51 or older, your body mass index should be between 23-30. Your Body mass index is 38.11 kg/m. If this is out of the aforementioned range listed, please consider follow up with your Primary Care Provider.  If you are age 68 or younger, your body mass index should be between 19-25. Your Body mass index is 38.11 kg/m. If this is out of the aformentioned range listed, please consider follow up with your Primary Care Provider.   You have been scheduled for an endoscopy and colonoscopy. Please follow the written instructions given to you at your visit today. Please pick up your prep supplies at the pharmacy within the next 1-3 days. If you use inhalers (even only as needed), please bring them with you on the day of your procedure. Your physician has requested that you go to www.startemmi.com and enter the access code given to you at your visit today. This web site gives a general overview about your procedure. However, you should still follow specific instructions given to you by our office regarding your preparation for the procedure.  We have sent the following medications to your pharmacy for you to pick up at your convenience: Clenpiq  It was a pleasure to see you today!  Vito Cirigliano, D.O.

## 2018-12-21 NOTE — Progress Notes (Signed)
Chief Complaint: Epigastric pain, nausea/vomiting, regurgitation  Referring Provider:     [name], NP  HPI:    Melissa Santiago is a 53 y.o. female referred to the Gastroenterology Clinic for evaluation of nausea/vomiting and epigastric pain along with regurgitation.  She states she has had heartburn/regurgitaiton for years, but had been well controlled with avoiding exacerbating foods (spicy) and occasional Tums/Rolaids. Sxs worse in the last 6 months, with increased severity and frequency, particularly nocturnal sxs. Now sxs occurring during daytime as well and despite maintaining good diet, with increased regurgitation with forward regurgitation.  No dysphagia or odynophagia.  Upper abdominal pain described as a burning sensation. Had H pylori approx 5 years ago, treated with Abx per patient. Had a Gastric Sleeve with Fobi Ring placement and hiatal hernia repair (Dr. Deon Pilling) in [place] approx 5 years ago (name was Edison Pace at that time for records review).   Was taking Ibuprofen daily for back pain and arthralgias, and has stopped completely since seeing PCM on 2/17.   She was evaluated by her PCM for this issue on 12/13/2018.  Omeprazole was increased to twice daily and referred to GI for further evaluation.  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  States her reflux symptoms have improved with the high-dose PPI, but not completely resolved.  Abdominal pain improved but also not resolved.  Endoscopic history: No previous EGD or colonoscopy.  Past Medical History:  Diagnosis Date  . Acute lumbar radiculopathy 06/29/2013  . Chest pain in adult 05/24/2018  . Chronic constipation 05/24/2018  . Gastroesophageal reflux  disease 05/24/2018  . GERD (gastroesophageal reflux disease)   . Left sided sciatica 05/10/2013  . Lumbar disc herniation 06/29/2013  . Morbid obesity ([place]) 05/24/2018  . Obesity   . Osteopenia 03/23/2015  . S/P bariatric surgery 06/30/2013     Past Surgical History:  Procedure Laterality Date  . BREAST BIOPSY Left 2014  . [place] RESECTION  2014  . LUMBAR DISC SURGERY  2014/2016   Family History  Problem Relation Age of Onset  . Arthritis Mother   . Early death Mother   . Mental illness Mother   . Alcohol abuse Father   . Arthritis Father   . Early death Sister   . Thyroid cancer Sister   . Hypertension Sister   . Heart attack Maternal Grandfather   . Uterine cancer Sister   . Hypertension Sister   . Cervical cancer Sister   . Depression Sister   . Hypertension Sister   . Colon cancer Neg Hx   . Esophageal cancer Neg Hx   . Breast cancer Neg Hx    Social History   Tobacco Use  . Smoking status: Former Smoker    Packs/day: 0.50    Years: 15.00    Pack years: 7.50    Types: Cigarettes    Last attempt to quit: 2006    Years since quitting: 14.1  . Smokeless tobacco: Never Used  Substance Use Topics  . Alcohol use: Yes    Comment: rarely  . Drug use: Never   Current Outpatient Medications  Medication Sig Dispense Refill  . omeprazole (PRILOSEC) 40 MG capsule Take 1 capsule (40 mg total) by  mouth daily. 180 capsule 0   No current facility-administered medications for this visit.    Allergies  Allergen Reactions  . Tolmetin     Patient reports taking OTC ibuprofen and is not allergic      Review of Systems: All systems reviewed and negative except where noted in HPI.     Physical Exam:    Wt Readings from Last 3 Encounters:  12/21/18 225 lb 8 oz (102.3 kg)  12/13/18 224 lb (101.6 kg)  07/26/18 223 lb (101.2 kg)    BP 114/76   Pulse 93   Ht 5' 4.5" (1.638 m)   Wt 225 lb 8 oz (102.3 kg)   BMI 38.11 kg/m  Constitutional:   Pleasant, in no acute distress. Psychiatric: Normal mood and affect. Behavior is normal. EENT: Pupils normal.  Conjunctivae are normal. No scleral icterus. Neck supple. No cervical LAD. Cardiovascular: Normal rate, regular rhythm. No edema Pulmonary/chest: Effort normal and breath sounds normal. No wheezing, rales or rhonchi. Abdominal: Soft, nondistended, nontender. Bowel sounds active throughout. There are no masses palpable. No hepatomegaly. Neurological: Alert and oriented to person place and time. Skin: Skin is warm and dry. No rashes noted.   ASSESSMENT AND PLAN;   Melissa Santiago is a 53 y.o. female presenting with:  1) GERD: Clinical history seems most consistent with reflux, with breakthrough in the last 6 months or so with increased severity and frequency.  Symptoms improved with high-dose PPI, further indicative of reflux.  Given age, increasing symptoms, need for high-dose acid suppression therapy, will evaluate with EGD for LES laxity, return of hiatal hernia, erosive esophagitis, along with Barrett's esophagus screening.  -Resume high-dose PPI for now -EGD as above -Will titrate PPI pending endoscopic findings  2) Upper abdominal pain: -Resume high-dose PPI and Carafate as prescribed -Evaluate for mucosal/luminal etiology at time of EGD with random and directed biopsies -History of H. pylori.  Will evaluate time of EGD with biopsies as above  3) CRC Screening: No previous history of CRC screening.  Discussed screening modalities at length today and she opted for an optical colonoscopy at the time of EGD as above.  4) History of Gastric Sleeve: History of gastric sleeve with Fobi ring placement.  Will evaluate postoperative site.  If planning on any antireflux surgery, recommend referral back to a Bariatric Surgeon given her surgical history.  May need to consider interrogation of location of the Fobi ring with fluoroscopic or cross-sectional imaging pending endoscopic  evaluation.  The indications, risks, and benefits of EGD and colonoscopy were explained to the patient in detail. Risks include but are not limited to bleeding, perforation, adverse reaction to medications, and cardiopulmonary compromise. Sequelae include but are not limited to the possibility of surgery, hositalization, and mortality. The patient verbalized understanding and wished to proceed. All questions answered, referred to scheduler and bowel prep ordered. Further recommendations pending results of the exam.      Lavena Bullion, DO, FACG  12/21/2018, 9:27 AM   [name], NP

## 2018-12-29 ENCOUNTER — Encounter: Payer: Self-pay | Admitting: Gastroenterology

## 2019-01-04 ENCOUNTER — Encounter (HOSPITAL_BASED_OUTPATIENT_CLINIC_OR_DEPARTMENT_OTHER): Payer: Self-pay

## 2019-01-04 ENCOUNTER — Emergency Department (HOSPITAL_BASED_OUTPATIENT_CLINIC_OR_DEPARTMENT_OTHER): Payer: PRIVATE HEALTH INSURANCE

## 2019-01-04 ENCOUNTER — Other Ambulatory Visit: Payer: Self-pay

## 2019-01-04 ENCOUNTER — Emergency Department (HOSPITAL_BASED_OUTPATIENT_CLINIC_OR_DEPARTMENT_OTHER)
Admission: EM | Admit: 2019-01-04 | Discharge: 2019-01-04 | Disposition: A | Discharge status: Home / Self Care | Payer: PRIVATE HEALTH INSURANCE | Attending: Emergency Medicine | Admitting: Emergency Medicine

## 2019-01-04 DIAGNOSIS — Z79899 Other long term (current) drug therapy: Secondary | ICD-10-CM | POA: Insufficient documentation

## 2019-01-04 DIAGNOSIS — Z87891 Personal history of nicotine dependence: Secondary | ICD-10-CM | POA: Insufficient documentation

## 2019-01-04 DIAGNOSIS — J209 Acute bronchitis, unspecified: Secondary | ICD-10-CM | POA: Diagnosis not present

## 2019-01-04 DIAGNOSIS — R05 Cough: Secondary | ICD-10-CM | POA: Diagnosis not present

## 2019-01-04 DIAGNOSIS — R112 Nausea with vomiting, unspecified: Secondary | ICD-10-CM

## 2019-01-04 DIAGNOSIS — R0602 Shortness of breath: Secondary | ICD-10-CM | POA: Diagnosis present

## 2019-01-04 LAB — URINALYSIS, ROUTINE W REFLEX MICROSCOPIC
Bilirubin Urine: NEGATIVE
Glucose, UA: NEGATIVE mg/dL
Hgb urine dipstick: NEGATIVE
Ketones, ur: NEGATIVE mg/dL
Leukocytes,Ua: NEGATIVE
NITRITE: NEGATIVE
PROTEIN: NEGATIVE mg/dL
Specific Gravity, Urine: <1.005 — ABNORMAL LOW (ref 1.005–1.030)
pH: 7 (ref 5.0–8.0)

## 2019-01-04 LAB — CBC WITH DIFFERENTIAL/PLATELET
Abs Immature Granulocytes: 0.07 10*3/uL (ref 0.00–0.07)
Basophils Absolute: 0 10*3/uL (ref 0.0–0.1)
Basophils Relative: 0 %
Eosinophils Absolute: 0 10*3/uL (ref 0.0–0.5)
Eosinophils Relative: 0 %
HCT: 43.2 % (ref 36.0–46.0)
Hemoglobin: 13.9 g/dL (ref 12.0–15.0)
Immature Granulocytes: 1 %
Lymphocytes Relative: 5 %
Lymphs Abs: 0.5 10*3/uL — ABNORMAL LOW (ref 0.7–4.0)
MCH: 27.3 pg (ref 26.0–34.0)
MCHC: 32.2 g/dL (ref 30.0–36.0)
MCV: 84.7 fL (ref 80.0–100.0)
Monocytes Absolute: 0.6 10*3/uL (ref 0.1–1.0)
Monocytes Relative: 6 %
NEUTROS ABS: 9 10*3/uL — AB (ref 1.7–7.7)
Neutrophils Relative %: 88 %
PLATELETS: 178 10*3/uL (ref 150–400)
RBC: 5.1 MIL/uL (ref 3.87–5.11)
RDW: 13 % (ref 11.5–15.5)
WBC: 10.2 10*3/uL (ref 4.0–10.5)
nRBC: 0 % (ref 0.0–0.2)

## 2019-01-04 LAB — COMPREHENSIVE METABOLIC PANEL
ALT: 21 U/L (ref 0–44)
ANION GAP: 9 (ref 5–15)
AST: 19 U/L (ref 15–41)
Albumin: 3.9 g/dL (ref 3.5–5.0)
Alkaline Phosphatase: 80 U/L (ref 38–126)
BUN: 10 mg/dL (ref 6–20)
CO2: 21 mmol/L — ABNORMAL LOW (ref 22–32)
Calcium: 8.7 mg/dL — ABNORMAL LOW (ref 8.9–10.3)
Chloride: 101 mmol/L (ref 98–111)
Creatinine, Ser: 0.6 mg/dL (ref 0.44–1.00)
GFR calc Af Amer: >60 mL/min (ref >60)
GFR calc non Af Amer: >60 mL/min (ref >60)
Glucose, Bld: 108 mg/dL — ABNORMAL HIGH (ref 70–99)
Potassium: 3.6 mmol/L (ref 3.5–5.1)
Sodium: 131 mmol/L — ABNORMAL LOW (ref 135–145)
Total Bilirubin: 0.7 mg/dL (ref 0.3–1.2)
Total Protein: 7 g/dL (ref 6.5–8.1)

## 2019-01-04 LAB — INFLUENZA PANEL BY PCR (TYPE A & B)
Influenza A By PCR: POSITIVE — AB
Influenza B By PCR: NEGATIVE

## 2019-01-04 LAB — PREGNANCY, URINE: PREG TEST UR: NEGATIVE

## 2019-01-04 LAB — LACTIC ACID, PLASMA: Lactic Acid, Venous: 0.8 mmol/L (ref 0.5–1.9)

## 2019-01-04 MED ORDER — SODIUM CHLORIDE 0.9 % IV BOLUS (SEPSIS)
1000.0000 mL | Freq: Once | INTRAVENOUS | Status: AC
  Administered 2019-01-04: 1000 mL via INTRAVENOUS

## 2019-01-04 MED ORDER — ONDANSETRON HCL 4 MG PO TABS: 4.0000 mg | ORAL_TABLET | Freq: Three times a day (TID) | ORAL | 0 refills | Status: DC | PRN

## 2019-01-04 MED ORDER — AZITHROMYCIN 250 MG PO TABS: 250.0000 mg | ORAL_TABLET | Freq: Every day | ORAL | 0 refills | Status: DC

## 2019-01-04 MED ORDER — SODIUM CHLORIDE 0.9 % IV BOLUS (SEPSIS): 1000.0000 mL | Freq: Once | INTRAVENOUS | Status: DC

## 2019-01-04 MED ORDER — ACETAMINOPHEN 325 MG PO TABS
650.0000 mg | ORAL_TABLET | Freq: Once | ORAL | Status: AC | PRN
  Administered 2019-01-04: 650 mg via ORAL
  Filled 2019-01-04: qty 2

## 2019-01-04 MED ORDER — BENZONATATE 100 MG PO CAPS: 100.0000 mg | ORAL_CAPSULE | Freq: Three times a day (TID) | ORAL | 0 refills | Status: DC

## 2019-01-04 MED FILL — ONDANSETRON HCL 4 MG TABLET: 4 | 4 days supply | Qty: 12 | Fill #0

## 2019-01-04 MED FILL — AZITHROMYCIN 250 MG TABLET: 250 | 5 days supply | Qty: 6 | Fill #0

## 2019-01-04 MED FILL — BENZONATATE 100 MG CAP: 100 | 7 days supply | Qty: 21 | Fill #0

## 2019-01-04 NOTE — ED Provider Notes (Signed)
[facility] EMERGENCY DEPARTMENT Provider Note   CSN: [id number] Arrival date & time: 01/04/19  1300    History   Chief Complaint Chief Complaint  Patient presents with  . Shortness of Breath    HPI Melissa Santiago is a 53 y.o. female.  She is complaining of feeling sick for a month since returning from [facility].  She has been short of breath and had a productive cough with some white sputum.  She has been feeling hot and cold.  She has had no appetite.  She went to a urgent care center where she was treated for bronchitis with a Z-Pak.  She has been able to take this medicine due to her vomiting.  It sounds like earlier in the course of this illness she was treated with an antibiotic and steroids for presumed sinusitis.  No chest pain no abdominal pain no urinary symptoms normal bowel movements.  No sick contacts.  She has not seen her primary care doctor for this.  She states for the last 8 months she has had frequent vomiting and is scheduled to get an upper and lower GI in about a week.     The history is provided by the patient.  Shortness of Breath  Severity:  Moderate Onset quality:  Gradual Timing:  Constant Progression:  Worsening Chronicity:  New Context: activity   Relieved by:  None tried Worsened by:  Activity Ineffective treatments:  None tried Associated symptoms: cough, fever, sputum production and vomiting   Associated symptoms: no abdominal pain, no chest pain, no ear pain, no hemoptysis, no rash, no syncope and no wheezing     Past Medical History:  Diagnosis Date  . Acute lumbar radiculopathy 06/29/2013  . Chest pain in adult 05/24/2018  . Chronic constipation 05/24/2018  . Gastroesophageal reflux disease 05/24/2018  . GERD (gastroesophageal reflux disease)   . Left sided sciatica 05/10/2013  . Lumbar disc herniation 06/29/2013  . Morbid obesity ([place]) 05/24/2018  . Obesity   . Osteopenia 03/23/2015  . S/P bariatric surgery 06/30/2013    Patient  Active Problem List   Diagnosis Date Noted  . Chest pain in adult 05/24/2018  . Morbid obesity ([place]) 05/24/2018  . Gastroesophageal reflux disease 05/24/2018  . Chronic constipation 05/24/2018  . Osteopenia 03/23/2015  . S/P bariatric surgery 06/30/2013  . Lumbar disc herniation 06/29/2013  . Acute lumbar radiculopathy 06/29/2013  . Left sided sciatica 05/10/2013    Past Surgical History:  Procedure Laterality Date  . BREAST BIOPSY Left 2014  . [place] RESECTION  2014  . LUMBAR DISC SURGERY  2014/2016     OB History   No obstetric history on file.      Home Medications    Prior to Admission medications   Medication Sig Start Date End Date Taking? Authorizing Provider  omeprazole (PRILOSEC) 40 MG capsule Take 1 capsule (40 mg total) by mouth daily. 12/13/18   [name], NP  Sod Picosulfate-Mag Ox-Cit Acd (CLENPIQ) 10-3.5-12 MG-GM -GM/160ML SOLN Take 1 kit by mouth as directed. 12/21/18   Cirigliano, Dominic Pea, DO    Family History Family History  Problem Relation Age of Onset  . Arthritis Mother   . Early death Mother   . Mental illness Mother   . Alcohol abuse Father   . Arthritis Father   . Early death Sister   . Thyroid cancer Sister   . Hypertension Sister   . Heart attack Maternal Grandfather   .  Uterine cancer Sister   . Hypertension Sister   . Cervical cancer Sister   . Depression Sister   . Hypertension Sister   . Colon cancer Neg Hx   . Esophageal cancer Neg Hx   . Breast cancer Neg Hx     Social History Social History   Tobacco Use  . Smoking status: Former Smoker    Packs/day: 0.50    Years: 15.00    Pack years: 7.50    Types: Cigarettes    Last attempt to quit: 2006    Years since quitting: 14.1  . Smokeless tobacco: Never Used  Substance Use Topics  . Alcohol use: Yes    Comment: rarely  . Drug use: Never     Allergies   Tolmetin   Review of Systems Review of Systems  Constitutional: Positive for  appetite change, chills, fatigue and fever.  HENT: Positive for sinus pressure. Negative for ear pain.   Eyes: Negative for visual disturbance.  Respiratory: Positive for cough, sputum production and shortness of breath. Negative for hemoptysis and wheezing.   Cardiovascular: Negative for chest pain and syncope.  Gastrointestinal: Positive for vomiting. Negative for abdominal pain.  Genitourinary: Negative for dysuria.  Musculoskeletal: Positive for myalgias.  Skin: Negative for rash.  Neurological: Positive for dizziness. Negative for seizures.     Physical Exam Updated Vital Signs BP 104/75 (BP Location: Right Arm)   Pulse (!) 125   Temp (!) 102.6 F (39.2 C) (Rectal)   Resp 19   Ht '5\' 4"'  (1.626 m)   Wt 99.3 kg   SpO2 98%   BMI 37.59 kg/m   Physical Exam Vitals signs and nursing note reviewed.  Constitutional:      General: She is not in acute distress.    Appearance: She is well-developed.  HENT:     Head: Normocephalic and atraumatic.  Eyes:     Conjunctiva/sclera: Conjunctivae normal.  Neck:     Musculoskeletal: Neck supple.  Cardiovascular:     Rate and Rhythm: Regular rhythm. Tachycardia present.     Heart sounds: No murmur.  Pulmonary:     Effort: Pulmonary effort is normal. No respiratory distress.     Breath sounds: Normal breath sounds.  Abdominal:     Palpations: Abdomen is soft.     Tenderness: There is no abdominal tenderness.  Musculoskeletal: Normal range of motion.     Right lower leg: She exhibits no tenderness.     Left lower leg: She exhibits no tenderness.  Skin:    General: Skin is warm and dry.     Capillary Refill: Capillary refill takes less than 2 seconds.  Neurological:     General: No focal deficit present.     Mental Status: She is alert and oriented to person, place, and time.      ED Treatments / Results  Labs (all labs ordered are listed, but only abnormal results are displayed) Labs Reviewed  COMPREHENSIVE METABOLIC PANEL  - Abnormal; Notable for the following components:      Result Value   Sodium 131 (*)    CO2 21 (*)    Glucose, Bld 108 (*)    Calcium 8.7 (*)    All other components within normal limits  CBC WITH DIFFERENTIAL/PLATELET - Abnormal; Notable for the following components:   Neutro Abs 9.0 (*)    Lymphs Abs 0.5 (*)    All other components within normal limits  URINALYSIS, ROUTINE W REFLEX MICROSCOPIC - Abnormal; Notable for  the following components:   Specific Gravity, Urine <1.005 (*)    All other components within normal limits  INFLUENZA PANEL BY PCR (TYPE A & B) - Abnormal; Notable for the following components:   Influenza A By PCR POSITIVE (*)    All other components within normal limits  URINE CULTURE  CULTURE, BLOOD (ROUTINE X 2)  CULTURE, BLOOD (ROUTINE X 2)  LACTIC ACID, PLASMA  PREGNANCY, URINE    EKG EKG Interpretation  Date/Time:  Tuesday January 04 2019 13:48:35 EDT Ventricular Rate:  106 PR Interval:    QRS Duration: 101 QT Interval:  336 QTC Calculation: 447 R Axis:   68 Text Interpretation:  Sinus tachycardia no acute st/ts No old tracing to compare Confirmed by Aletta Edouard [phone]) on 01/04/2019 1:55:39 PM   Radiology Dg Chest 2 View  Result Date: 01/04/2019 CLINICAL DATA:  Shortness of breath and cough.  Fever. EXAM: CHEST - 2 VIEW COMPARISON:  None. FINDINGS: The heart size and mediastinal contours are within normal limits. Both lungs are clear. The visualized skeletal structures are unremarkable. IMPRESSION: No active cardiopulmonary disease. Electronically Signed   By: Dorise Bullion III M.D   On: 01/04/2019 13:45    Procedures Procedures (including critical care time)  Medications Ordered in ED Medications  sodium chloride 0.9 % bolus 1,000 mL (has no administration in time range)    And  sodium chloride 0.9 % bolus 1,000 mL (0 mLs Intravenous Stopped 01/04/19 1606)  sodium chloride 0.9 % bolus 1,000 mL (0 mLs Intravenous Stopped 01/04/19 1606)    acetaminophen (TYLENOL) tablet 650 mg (650 mg Oral Given 01/04/19 1328)     Initial Impression / Assessment and Plan / ED Course  I have reviewed the triage vital signs and the nursing notes.  Pertinent labs & imaging results that were available during my care of the patient were reviewed by me and considered in my medical decision making (see chart for details).  Clinical Course as of Jan 03 1817  Tue Jan 04, 2019  1533 Updated patient on her lab results.  So far her work-up is been unremarkable.  She said she feels better after the IV fluid boluses.  She was supposed to be on Zithromax for bronchitis but was vomiting it up so I will send her home with Zithromax along with some Zofran and some cough medication.  She understands she needs to follow-up with her primary care doctor and return if any worsening symptoms.   [MB]    Clinical Course User Index [MB] Hayden Rasmussen, MD        Final Clinical Impressions(s) / ED Diagnoses   Final diagnoses:  Acute bronchitis, unspecified organism  Non-intractable vomiting with nausea, unspecified vomiting type    ED Discharge Orders         Ordered    azithromycin (ZITHROMAX) 250 MG tablet  Daily     01/04/19 1539    benzonatate (TESSALON) 100 MG capsule  Every 8 hours     01/04/19 1539    ondansetron (ZOFRAN) 4 MG tablet  Every 8 hours PRN     01/04/19 1539           Hayden Rasmussen, MD 01/04/19 1818

## 2019-01-04 NOTE — Discharge Instructions (Signed)
You were seen in the emergency department for 4 weeks of cough and shortness of breath and more recently nausea vomiting.  You had a chest x-ray that did not show an obvious pneumonia along with blood work that did not give an obvious explanation for your symptoms.  Your flu test is still pending along with blood cultures.  We are prescribing you antibiotics nausea medication and cough medication.  Please follow-up with your doctor and return if any worsening symptoms.

## 2019-01-04 NOTE — ED Notes (Signed)
Set of blood cultures in lab. Pt recently prescribed zpack but reports not being able to hold it down due to emesis

## 2019-01-04 NOTE — ED Notes (Signed)
ED Provider at bedside. 

## 2019-01-04 NOTE — ED Triage Notes (Signed)
Pt presents with SOB- fevers- productive cough and emesis x 2 weeks. Pt has seen PCP with no improvement. Family at bedside. Pt SpO2 99%- HR 131

## 2019-01-04 NOTE — ED Notes (Signed)
Patient transported to X-ray 

## 2019-01-05 ENCOUNTER — Encounter: Payer: Self-pay | Admitting: Family

## 2019-01-06 LAB — URINE CULTURE

## 2019-01-07 ENCOUNTER — Telehealth: Payer: Self-pay | Admitting: *Deleted

## 2019-01-07 NOTE — Telephone Encounter (Signed)
Completing chart prep for ECL scheduled Tuesday 01/11/19, noted pt had been seen in the ED. Called pt to check on status, she states she was diagnosed with influenza A. Per Dr. Bryan Lemma pt need to cancel appointment and reschedule. Pt understands. Wishes to look at her work schedule and call us back to set up new appointment.

## 2019-01-09 ENCOUNTER — Other Ambulatory Visit: Payer: Self-pay | Admitting: Family

## 2019-01-09 LAB — CULTURE, BLOOD (ROUTINE X 2)
Culture: NO GROWTH
Culture: NO GROWTH
Special Requests: ADEQUATE
Special Requests: ADEQUATE

## 2019-01-11 ENCOUNTER — Encounter: Payer: PRIVATE HEALTH INSURANCE | Admitting: Gastroenterology

## 2019-01-12 ENCOUNTER — Other Ambulatory Visit: Payer: Self-pay | Admitting: Family

## 2019-01-19 ENCOUNTER — Telehealth: Payer: Self-pay | Admitting: *Deleted

## 2019-01-19 NOTE — Telephone Encounter (Signed)
Spoke with patient.  Explained to her that we are cancelling her procedure d/t covid 19 restrictions.  Advised her that we will call back to reschedule.

## 2019-01-25 ENCOUNTER — Encounter: Payer: PRIVATE HEALTH INSURANCE | Admitting: Gastroenterology

## 2019-03-01 ENCOUNTER — Telehealth: Payer: Self-pay | Admitting: *Deleted

## 2019-03-01 NOTE — Telephone Encounter (Signed)
Ok to schedule ECL per Dr. Bryan Lemma.  Spoke with pt and procedures scheduled for Friday, 03-18-19 at 900.  Pt states she still has Clenpiq at home.  New instructions sent via MyChart

## 2019-03-14 ENCOUNTER — Ambulatory Visit: Payer: PRIVATE HEALTH INSURANCE | Admitting: Family

## 2019-03-16 ENCOUNTER — Telehealth: Payer: Self-pay | Admitting: *Deleted

## 2019-03-16 NOTE — Telephone Encounter (Signed)
Covid-19 travel screening questions  Have you traveled in the last 14 days?no If yes where?  Do you now or have you had a fever in the last 14 days?no  Do you have any respiratory symptoms of shortness of breath or cough now or in the last 14 days?no  Do you have a medical history of Congestive Heart Failure?  Do you have a medical history of lung disease?  Do you have any family members or close contacts with diagnosed or suspected Covid-19?no  Pt made aware of care partner policy and will bring a mask with her. Sm

## 2019-03-18 ENCOUNTER — Ambulatory Visit (AMBULATORY_SURGERY_CENTER): Payer: PRIVATE HEALTH INSURANCE | Admitting: Gastroenterology

## 2019-03-18 ENCOUNTER — Encounter: Payer: Self-pay | Admitting: Gastroenterology

## 2019-03-18 ENCOUNTER — Other Ambulatory Visit: Payer: Self-pay

## 2019-03-18 VITALS — BP 120/72 | HR 65 | Temp 97.7°F | Resp 14 | Ht 64.5 in | Wt 225.0 lb

## 2019-03-18 DIAGNOSIS — K3189 Other diseases of stomach and duodenum: Secondary | ICD-10-CM

## 2019-03-18 DIAGNOSIS — K2970 Gastritis, unspecified, without bleeding: Secondary | ICD-10-CM | POA: Diagnosis not present

## 2019-03-18 DIAGNOSIS — K449 Diaphragmatic hernia without obstruction or gangrene: Secondary | ICD-10-CM | POA: Diagnosis not present

## 2019-03-18 DIAGNOSIS — K5730 Diverticulosis of large intestine without perforation or abscess without bleeding: Secondary | ICD-10-CM

## 2019-03-18 DIAGNOSIS — K219 Gastro-esophageal reflux disease without esophagitis: Secondary | ICD-10-CM | POA: Diagnosis not present

## 2019-03-18 DIAGNOSIS — K635 Polyp of colon: Secondary | ICD-10-CM | POA: Diagnosis not present

## 2019-03-18 DIAGNOSIS — Z1211 Encounter for screening for malignant neoplasm of colon: Secondary | ICD-10-CM

## 2019-03-18 DIAGNOSIS — K640 First degree hemorrhoids: Secondary | ICD-10-CM

## 2019-03-18 MED ORDER — SODIUM CHLORIDE 0.9 % IV SOLN: 500.0000 mL | Freq: Once | INTRAVENOUS | Status: DC

## 2019-03-18 NOTE — Op Note (Signed)
[facility] Patient Name: Melissa Santiago Procedure Date: 03/18/2019 9:48 AM MRN: [id number] Endoscopist: Gerrit Heck , MD Age: 53 Referring MD:  Date of Birth: 17-Nov-1965 Gender: Female Account #: [id number] Procedure:                Upper GI endoscopy Indications:              Upper abdominal pain, Esophageal reflux,                            Regurgitation. Hx of Gastric Sleeve with Fobi Ring                            placement and hiatal hernia repair in [place]                            approx 5 years ago. Medicines:                Monitored Anesthesia Care Procedure:                Pre-Anesthesia Assessment:                           - Prior to the procedure, a History and Physical                            was performed, and patient medications and                            allergies were reviewed. The patient's tolerance of                            previous anesthesia was also reviewed. The risks                            and benefits of the procedure and the sedation                            options and risks were discussed with the patient.                            All questions were answered, and informed consent                            was obtained. Prior Anticoagulants: The patient has                            taken no previous anticoagulant or antiplatelet                            agents. ASA Grade Assessment: II - A patient with                            mild systemic disease. After reviewing the risks  and benefits, the patient was deemed in                            satisfactory condition to undergo the procedure.                           After obtaining informed consent, the endoscope was                            passed under direct vision. Throughout the                            procedure, the patient's blood pressure, pulse, and                            oxygen saturations were monitored continuously.  The                            Model GIF-HQ190 [id number]) scope was introduced                            through the mouth, and advanced to the second part                            of duodenum. The upper GI endoscopy was                            accomplished without difficulty. The patient                            tolerated the procedure well. Scope In: Scope Out: Findings:                 A 2 cm axial height and 3 cm transverse width                            hiatal hernia was present.                           The upper third of the esophagus and middle third                            of the esophagus were normal.                           The Z-line was regular and was found 40 cm from the                            incisors.                           The gastroesophageal flap valve was visualized                            endoscopically and classified as Hill Grade III                            (  minimal fold, loose to endoscope, hiatal hernia                            likely).                           Evidence of an gastric banding was found in the mid                            gastric body. This was characterized by healthy                            appearing mucosa. This was easily traversed. The                            remainder of the gastric body was normal appearing.                           Localized mild inflammation characterized by                            erythema was found in the gastric antrum and                            pre-pyloric region. Biopsies were taken with a cold                            forceps for Helicobacter pylori testing. Estimated                            blood loss was minimal.                           The gastric fundus was normal.                           The duodenal bulb, first portion of the duodenum                            and second portion of the duodenum were normal. Complications:            No immediate  complications. Estimated Blood Loss:     Estimated blood loss was minimal. Impression:               - 2 cm axial height and 3 cm transverse width                            hiatal hernia with pronounced LES laxity and a Hill                            Grade 3 valve noted on this exam.                           - Normal upper third of esophagus and middle third  of esophagus.                           - Z-line regular, 40 cm from the incisors.                           - An adjustable gastric banding was found,                            characterized by healthy appearing mucosa.                           - Gastritis. Biopsied.                           - Normal gastric fundus.                           - Normal duodenal bulb, first portion of the                            duodenum and second portion of the duodenum. Recommendation:           - Patient has a contact number available for                            emergencies. The signs and symptoms of potential                            delayed complications were discussed with the                            patient. Return to normal activities tomorrow.                            Written discharge instructions were provided to the                            patient.                           - Resume previous diet today.                           - Continue present medications.                           - Await pathology results.                           - Return to GI clinic in 1-2 months or sooner as                            needed to discuss ongoing management of reflux.                           - If considering  hiatal hernia repair and                            antireflux surgery, recommend referral to Bariatric                            Surgery. Given the previous Fobi ring placement and                            size of the hernia, this would not be amenable to                             Transoral Incisionless Fundoplication (TIF). Gerrit Heck, MD 03/18/2019 10:22:43 AM

## 2019-03-18 NOTE — Op Note (Signed)
[facility] Patient Name: Melissa Santiago Procedure Date: 03/18/2019 9:48 AM MRN: [id number] Endoscopist: Gerrit Heck , MD Age: 53 Referring MD:  Date of Birth: 10-14-1966 Gender: Female Account #: [id number] Procedure:                Colonoscopy Indications:              Screening for colorectal malignant neoplasm, This                            is the patient's first colonoscopy Medicines:                Monitored Anesthesia Care Procedure:                Pre-Anesthesia Assessment:                           - Prior to the procedure, a History and Physical                            was performed, and patient medications and                            allergies were reviewed. The patient's tolerance of                            previous anesthesia was also reviewed. The risks                            and benefits of the procedure and the sedation                            options and risks were discussed with the patient.                            All questions were answered, and informed consent                            was obtained. Prior Anticoagulants: The patient has                            taken no previous anticoagulant or antiplatelet                            agents. ASA Grade Assessment: II - A patient with                            mild systemic disease. After reviewing the risks                            and benefits, the patient was deemed in                            satisfactory condition to undergo the procedure.  After obtaining informed consent, the colonoscope                            was passed under direct vision. Throughout the                            procedure, the patient's blood pressure, pulse, and                            oxygen saturations were monitored continuously. The                            Colonoscope was introduced through the anus and                            advanced to the the  terminal ileum. The colonoscopy                            was performed without difficulty. The patient                            tolerated the procedure well. The quality of the                            bowel preparation was adequate. The terminal ileum,                            ileocecal valve, appendiceal orifice, and rectum                            were photographed. Scope In: 10:01:58 AM Scope Out: 10:12:18 AM Scope Withdrawal Time: 0 hours 7 minutes 38 seconds  Total Procedure Duration: 0 hours 10 minutes 20 seconds  Findings:                 The perianal and digital rectal examinations were                            normal.                           A 2 mm polyp was found in the sigmoid colon. The                            polyp was sessile. The polyp was removed with a                            cold biopsy forceps. Resection and retrieval were                            complete. Estimated blood loss was minimal.                           Multiple small and large-mouthed diverticula were  found in the sigmoid colon, descending colon,                            transverse colon and ascending colon.                           Non-bleeding internal hemorrhoids were found during                            retroflexion. The hemorrhoids were small.                           The terminal ileum contained a few three mm ulcers.                            No bleeding was present. Biopsies were taken with a                            cold forceps for histology. Estimated blood loss                            was minimal. Complications:            No immediate complications. Estimated Blood Loss:     Estimated blood loss was minimal. Impression:               - One 2 mm polyp in the sigmoid colon, removed with                            a cold biopsy forceps. Resected and retrieved.                           - Diverticulosis in the sigmoid colon, in the                             descending colon, in the transverse colon and in                            the ascending colon.                           - Non-bleeding internal hemorrhoids.                           - A few ulcers in the terminal ileum. Biopsied. Recommendation:           - Patient has a contact number available for                            emergencies. The signs and symptoms of potential                            delayed complications were discussed with the  patient. Return to normal activities tomorrow.                            Written discharge instructions were provided to the                            patient.                           - Resume previous diet today.                           - Continue present medications.                           - Await pathology results.                           - Repeat colonoscopy in 7-10 years for surveillance                            based on pathology results.                           - Use fiber, for example Citrucel, Fibercon, Konsyl                            or Metamucil.                           - No aspirin, ibuprofen, naproxen, or other                            non-steroidal anti-inflammatory drugs. Gerrit Heck, MD 03/18/2019 10:32:25 AM

## 2019-03-18 NOTE — Progress Notes (Signed)
History reviewed today  Temp and Covid questions per [place]. [place], [place] per Lorrin Goodell

## 2019-03-18 NOTE — Progress Notes (Signed)
Called to room to assist during endoscopic procedure.  Patient ID and intended procedure confirmed with present staff. Received instructions for my participation in the procedure from the performing physician.  

## 2019-03-18 NOTE — Patient Instructions (Signed)
Information on polyps, gastritis, and diverticulosis given to you today.  Await pathology results.  No aspirin, ibuprofen, naproxen or other non steroidal anti inflammatory medications.  Use fiber supplements such as Fibercon, Citrucil or Metamucil.  Return to GI clinic in 1-2 months.   YOU HAD AN ENDOSCOPIC PROCEDURE TODAY AT [facility]:   Refer to the procedure report that was given to you for any specific questions about what was found during the examination.  If the procedure report does not answer your questions, please call your gastroenterologist to clarify.  If you requested that your care partner not be given the details of your procedure findings, then the procedure report has been included in a sealed envelope for you to review at your convenience later.  YOU SHOULD EXPECT: Some feelings of bloating in the abdomen. Passage of more gas than usual.  Walking can help get rid of the air that was put into your GI tract during the procedure and reduce the bloating. If you had a lower endoscopy (such as a colonoscopy or flexible sigmoidoscopy) you may notice spotting of blood in your stool or on the toilet paper. If you underwent a bowel prep for your procedure, you may not have a normal bowel movement for a few days.  Please Note:  You might notice some irritation and congestion in your nose or some drainage.  This is from the oxygen used during your procedure.  There is no need for concern and it should clear up in a day or so.  SYMPTOMS TO REPORT IMMEDIATELY:   Following lower endoscopy (colonoscopy or flexible sigmoidoscopy):  Excessive amounts of blood in the stool  Significant tenderness or worsening of abdominal pains  Swelling of the abdomen that is new, acute  Fever of 100F or higher   Following upper endoscopy (EGD)  Vomiting of blood or coffee ground material  New chest pain or pain under the shoulder blades  Painful or persistently difficult  swallowing  New shortness of breath  Fever of 100F or higher  Black, tarry-looking stools  For urgent or emergent issues, a gastroenterologist can be reached at any hour by calling [phone].   DIET:  We do recommend a small meal at first, but then you may proceed to your regular diet.  Drink plenty of fluids but you should avoid alcoholic beverages for 24 hours.  ACTIVITY:  You should plan to take it easy for the rest of today and you should NOT DRIVE or use heavy machinery until tomorrow (because of the sedation medicines used during the test).    FOLLOW UP: Our staff will call the number listed on your records 48-72 hours following your procedure to check on you and address any questions or concerns that you may have regarding the information given to you following your procedure. If we do not reach you, we will leave a message.  We will attempt to reach you two times.  During this call, we will ask if you have developed any symptoms of COVID 19. If you develop any symptoms (for example fever, flu-like symptoms, shortness of breath, cough etc.) before then, please call [phone].  If any biopsies were taken you will be contacted by phone or by letter within the next 1-3 weeks.  Please call us at [phone] if you have not heard about the biopsies in 3 weeks.    SIGNATURES/CONFIDENTIALITY: You and/or your care partner have signed paperwork which will be entered into your electronic medical  record.  These signatures attest to the fact that that the information above on your After Visit Summary has been reviewed and is understood.  Full responsibility of the confidentiality of this discharge information lies with you and/or your care-partner. 

## 2019-03-18 NOTE — Progress Notes (Signed)
Report given to PACU, vss 

## 2019-03-22 ENCOUNTER — Telehealth: Payer: Self-pay | Admitting: *Deleted

## 2019-03-22 ENCOUNTER — Telehealth: Payer: Self-pay

## 2019-03-22 NOTE — Telephone Encounter (Signed)
  Follow up Call-  Call back number 03/18/2019  Post procedure Call Back phone  # [phone]  Permission to leave phone message Yes     Patient questions:  Message left to call us if necessary.

## 2019-03-22 NOTE — Telephone Encounter (Signed)
Left message on 2nd follow up call. 

## 2019-03-23 ENCOUNTER — Encounter: Payer: Self-pay | Admitting: Gastroenterology

## 2019-04-22 ENCOUNTER — Ambulatory Visit: Payer: PRIVATE HEALTH INSURANCE | Admitting: Gastroenterology

## 2019-05-03 ENCOUNTER — Encounter: Payer: Self-pay | Admitting: Gastroenterology

## 2019-05-03 ENCOUNTER — Other Ambulatory Visit: Payer: Self-pay

## 2019-05-03 ENCOUNTER — Telehealth (INDEPENDENT_AMBULATORY_CARE_PROVIDER_SITE_OTHER): Payer: PRIVATE HEALTH INSURANCE | Admitting: Gastroenterology

## 2019-05-03 VITALS — Ht 64.5 in | Wt 225.0 lb

## 2019-05-03 DIAGNOSIS — D125 Benign neoplasm of sigmoid colon: Secondary | ICD-10-CM

## 2019-05-03 DIAGNOSIS — Z903 Acquired absence of stomach [part of]: Secondary | ICD-10-CM

## 2019-05-03 DIAGNOSIS — K5730 Diverticulosis of large intestine without perforation or abscess without bleeding: Secondary | ICD-10-CM

## 2019-05-03 DIAGNOSIS — K449 Diaphragmatic hernia without obstruction or gangrene: Secondary | ICD-10-CM

## 2019-05-03 DIAGNOSIS — K219 Gastro-esophageal reflux disease without esophagitis: Secondary | ICD-10-CM | POA: Diagnosis not present

## 2019-05-03 DIAGNOSIS — K635 Polyp of colon: Secondary | ICD-10-CM

## 2019-05-03 MED ORDER — PANTOPRAZOLE SODIUM 40 MG PO TBEC: 40.0000 mg | DELAYED_RELEASE_TABLET | Freq: Two times a day (BID) | ORAL | 5 refills | Status: DC

## 2019-05-03 NOTE — Progress Notes (Signed)
Chief Complaint: GERD, regurgitation, nonulcer dyspepsia   Referring Provider:     [name], NP  GI History: 53 year old female with longstanding history of heartburn/regurgitation, relatively well controlled with dietary modifications (avoids spicy foods) and occasional Tums/Rolaids, but worsening in the last year or so, with increased severity/frequency and nocturnal symptoms.  Now with daytime symptoms as well despite dietary modifications, with increased regurgitation with forward flexion.  No dysphagia.  Started high-dose PPI in 11/2018.  EGD  Additionally, with dyspepsia.  History of H. pylori in 2015, treated with antibiotics. Had a Gastric Sleeve with Fobi Ring placement and hiatal hernia repair (Dr. Deon Pilling) in [place] approx 5 years ago (name was Edison Pace at that time for records review).   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  States her reflux symptoms have improved with the high-dose PPI, but not completely resolved.  Abdominal pain improved but also not resolved.  Endoscopic history: -EGD (02/2019, Dr. Bryan Lemma): 2 cm axial/3 cm transverse width HH, Hill grade 3 valve, normal Z line, evidence of gastric band, mild antral gastritis (biopsies negative for H. pylori) -Colonoscopy (02/2019, Dr. Bryan Lemma): 2 mm sigmoid hyperplastic polyp, diverticulosis, internal hemorrhoids, small ulcers in TI (biopsies benign).  Repeat in 10 years.  HPI:    Due to current restrictions/limitations of in-office visits due to the COVID-19 pandemic, this scheduled clinical appointment was converted to a telehealth virtual consultation using [employer].  -Time of medical discussion: 16 minutes -The patient did consent to this  virtual visit and is aware of possible charges through their insurance for this visit.  -Names of all parties present: Melissa Santiago (patient), Gerrit Heck, DO, [facility] (physician) -Patient location: Work -Physician location: Office  Melissa Santiago is a 53 y.o. female presenting to the Gastroenterology Clinic for routine follow-up.  Initially seen by me in 11/2018, with EGD/colonoscopy completed in 02/2019 as above.  Today, she states reflux generally well controlled with omeprazole bid, but will have breakthrough with missed dose or with dietary indiscretions (spicy, carbonated beverages). Continues to have nocturnal sxs 2-3 nights/week despite omeprazole. Occasional Tums for nighttime sxs.   No NSAIDs for a few months. Still with intermittent dyspepsia, worse with tomato-based sauces, mustard, wine.  Overall improved with the high-dose PPI.  Past medical history, past surgical history, social history, family history, medications, and allergies reviewed in the chart and with patient.    Past Medical History:  Diagnosis Date  . Acute lumbar radiculopathy 06/29/2013  . Chest pain in adult 05/24/2018  . Chronic constipation 05/24/2018  . Gastroesophageal reflux disease 05/24/2018  . GERD (gastroesophageal reflux disease)   . Left sided sciatica 05/10/2013  . Lumbar disc herniation 06/29/2013  . Morbid obesity ([place]) 05/24/2018  . Obesity   . Osteopenia 03/23/2015  . S/P bariatric surgery 06/30/2013     Past Surgical History:  Procedure Laterality Date  . BREAST BIOPSY Left 2014  . [place] RESECTION  2014  . LUMBAR DISC SURGERY  2014/2016   Family History  Problem Relation Age of Onset  . Arthritis Mother   . Early death Mother   . Mental illness Mother   . Alcohol abuse Father   . Arthritis Father   . Early death  Sister   . Thyroid cancer Sister   . Hypertension Sister   . Heart attack Maternal Grandfather   . Uterine cancer Sister   . Hypertension Sister   .  Cervical cancer Sister   . Depression Sister   . Hypertension Sister   . Colon cancer Neg Hx   . Esophageal cancer Neg Hx   . Breast cancer Neg Hx   . Rectal cancer Neg Hx   . Stomach cancer Neg Hx    Social History   Tobacco Use  . Smoking status: Former Smoker    Packs/day: 0.50    Years: 15.00    Pack years: 7.50    Types: Cigarettes    Quit date: 2006    Years since quitting: 14.5  . Smokeless tobacco: Never Used  Substance Use Topics  . Alcohol use: Yes    Comment: rarely  . Drug use: Never   Current Outpatient Medications  Medication Sig Dispense Refill  . omeprazole (PRILOSEC) 40 MG capsule TAKE ONE CAPSULE BY MOUTH DAILY 90 capsule 0  . Sod Picosulfate-Mag Ox-Cit Acd (CLENPIQ) 10-3.5-12 MG-GM -GM/160ML SOLN Take 1 kit by mouth as directed. (Patient not taking: Reported on 05/03/2019) 2 Bottle 0   No current facility-administered medications for this visit.    Allergies  Allergen Reactions  . Tolmetin     Patient reports taking OTC ibuprofen and is not allergic      Review of Systems: All systems reviewed and negative except where noted in HPI.     Physical Exam:    Complete physical exam not completed due to the nature of this telehealth communication.   Gen: Awake, alert, and oriented, and well communicative. HEENT: EOMI, non-icteric sclera, NCAT, MMM Neck: Normal movement of head and neck Pulm: No labored breathing, speaking in full sentences without conversational dyspnea Derm: No apparent lesions or bruising in visible field MS: Moves all visible extremities without noticeable abnormality Psych: Pleasant, cooperative, normal speech, thought processing seemingly intact   ASSESSMENT AND PLAN;   1) GERD: 2) Hiatal hernia 3) History of gastric surgery (Fobi ring)  - Reflux symptoms incompletely controlled with high-dose omeprazole.  Discussed treatment options to include: 1) changing PPI therapy, 2) adding nighttime H2 RA, 3) pH/impedance testing to  evaluate for NARD, 4) referral for [facility] repair and partial fundoplication with possible manipulation of the Fobi ring.  She would like to proceed with option 1 - Change Prilosec to Protonix 40 mg p.o. bid -Continue antireflux lifestyle/dietary modifications -RTC in 2 to 3 months to evaluate for medication efficacy.  If suboptimal, she did likely proceed with nighttime Pepcid, saving surgical intervention as the last option -Discussed the presence of her hernia along with the constricting effect of the Fobi ring is likely complicating/causative factors  4) Hyperplastic polyp 5) Uncomplicated diverticulosis -Repeat colonoscopy in 2030   [place], DO, FACG  05/03/2019, 9:02 AM   [name], NP

## 2019-05-03 NOTE — Patient Instructions (Signed)
If you are age 51 or older, your body mass index should be between 23-30. Your Body mass index is 38.02 kg/m. If this is out of the aforementioned range listed, please consider follow up with your Primary Care Provider.  If you are age 19 or younger, your body mass index should be between 19-25. Your Body mass index is 38.02 kg/m. If this is out of the aformentioned range listed, please consider follow up with your Primary Care Provider.   To help prevent the possible spread of infection to our patients, communities, and staff; we will be implementing the following measures:  As of now we are not allowing any visitors/family members to accompany you to any upcoming appointments with [facility]. If you have any concerns about this please contact our office to discuss prior to the appointment.   We have sent the following medications to your pharmacy for you to pick up at your convenience: Protonix 40mg  twice daily  Please call our office at [phone] to set up your 3 month follow up visit.  It was a pleasure to see you today!  Vito Cirigliano, D.O.

## 2019-05-26 ENCOUNTER — Other Ambulatory Visit: Payer: Self-pay

## 2019-05-31 NOTE — Telephone Encounter (Signed)
PA done at [email or web address] per representative of patient's insurance company. Called and told patient that we are waiting on insurance to approve medication and was told that if she wanted a 90 day supply then she would have to use [facility] or [facility] but she said 30 supply for her is fine since her husband is in remission from his cancer and they use the pharmacy there a lot

## 2019-06-15 NOTE — Telephone Encounter (Signed)
Brooke, since you did this PA will you please follow up on this with this patient? Thank you

## 2019-06-17 MED ORDER — PANTOPRAZOLE SODIUM 40 MG PO TBEC: 40.0000 mg | DELAYED_RELEASE_TABLET | Freq: Two times a day (BID) | ORAL | 5 refills | Status: DC

## 2019-06-17 NOTE — Telephone Encounter (Addendum)
Spoke with the rep from the insurance PA website and it was approved on 8/10 and she has approval from 8//10 to 11/20 of this year. 180 quantity for 90 days. Patient just asked for medication to be resent to pharmacy; RX sent to pharmacy per patient request;

## 2019-07-01 ENCOUNTER — Other Ambulatory Visit: Payer: Self-pay

## 2019-07-01 ENCOUNTER — Ambulatory Visit (INDEPENDENT_AMBULATORY_CARE_PROVIDER_SITE_OTHER): Payer: PRIVATE HEALTH INSURANCE | Admitting: Family

## 2019-07-01 DIAGNOSIS — F329 Major depressive disorder, single episode, unspecified: Secondary | ICD-10-CM | POA: Diagnosis not present

## 2019-07-01 DIAGNOSIS — N951 Menopausal and female climacteric states: Secondary | ICD-10-CM | POA: Diagnosis not present

## 2019-07-01 DIAGNOSIS — F32A Depression, unspecified: Secondary | ICD-10-CM

## 2019-07-01 MED ORDER — CITALOPRAM HYDROBROMIDE 20 MG PO TABS: ORAL_TABLET | ORAL | 0 refills | Status: DC

## 2019-07-01 NOTE — Progress Notes (Signed)
Virtual Visit via Video Note  I connected with Melissa Santiago on 07/01/19 at  7:00 AM EDT by a video enabled telemedicine application and verified that I am speaking with the correct person using two identifiers.  Location: Patient: car Provider: work   I discussed the limitations of evaluation and management by telemedicine and the availability of in person appointments. The patient expressed understanding and agreed to proceed.  History of Present Illness:   Patient is a 53 yr old female who presents today to discuss several concerns.  She reports that she has been having severe hot flashes which is causing her great discomfort and poor sleep.  She notes increased irritability.  She also reports that her emotions are "out of control."  As a result she has been stress eating.  Reports that she ate an entire bag of Doritos the other night and then felt terrible about it.  Frustrated with her irritability.  LMP 3/20 x 2 days.    Reports that her most recent weight was 229.  Wt Readings from Last 3 Encounters:  05/03/19 225 lb (102.1 kg)  03/18/19 225 lb (102.1 kg)  01/04/19 219 lb (99.3 kg)    Today's visit was initially scheduled as a face-to-face visit however she notified the front desk staff that she has been having coughing and nasal congestion.  Upon further questioning patient reports that her symptoms are most consistent with her seasonal allergies.  She notes coughing nasal congestion and frontal sinus headache.  Denies fever, myalgia, or sore throat.   Past Medical History:  Diagnosis Date  . Acute lumbar radiculopathy 06/29/2013  . Chest pain in adult 05/24/2018  . Chronic constipation 05/24/2018  . Gastroesophageal reflux disease 05/24/2018  . GERD (gastroesophageal reflux disease)   . Left sided sciatica 05/10/2013  . Lumbar disc herniation 06/29/2013  . Morbid obesity ([place]) 05/24/2018  . Obesity   . Osteopenia 03/23/2015  . S/P bariatric surgery 06/30/2013     Social History    Socioeconomic History  . Marital status: Married    Spouse name: [name]  . Number of children: 2  . Years of education: Not on file  . Highest education level: Not on file  Occupational History  . Occupation: Sales promotion account executive  Social Needs  . Financial resource strain: Not hard at all  . Food insecurity    Worry: Never true    Inability: Never true  . Transportation needs    Medical: No    Non-medical: No  Tobacco Use  . Smoking status: Former Smoker    Packs/day: 0.50    Years: 15.00    Pack years: 7.50    Types: Cigarettes    Quit date: 2006    Years since quitting: 14.6  . Smokeless tobacco: Never Used  Substance and Sexual Activity  . Alcohol use: Yes    Comment: rarely  . Drug use: Never  . Sexual activity: Yes    Partners: Male    Birth control/protection: I.U.D.  Lifestyle  . Physical activity    Days per week: 0 days    Minutes per session: Not on file  . Stress: Only a little  Relationships  . Social Herbalist on phone: More than three times a week    Gets together: Never    Attends religious service: Never    Active member of club or organization: No    Attends meetings of clubs or organizations: Never    Relationship status: Married  .  Intimate partner violence    Fear of current or ex partner: No    Emotionally abused: No    Physically abused: No    Forced sexual activity: No  Other Topics Concern  . Not on file  Social History Narrative   From [place], Divorced since 2015   Works as a Geophysical data processor for [employer]   2 daughters- one in the [employer]- lives in [place], one in [place]- works as a Pharmacist, hospital   Enjoys spending time with her husband   Has a dog and will adopt another soon.     Past Surgical History:  Procedure Laterality Date  . BREAST BIOPSY Left 2014  . [place] RESECTION  2014  . LUMBAR DISC SURGERY  2014/2016    Family History  Problem Relation Age of Onset  . Arthritis Mother   .  Early death Mother   . Mental illness Mother   . Alcohol abuse Father   . Arthritis Father   . Early death Sister   . Thyroid cancer Sister   . Hypertension Sister   . Heart attack Maternal Grandfather   . Uterine cancer Sister   . Hypertension Sister   . Cervical cancer Sister   . Depression Sister   . Hypertension Sister   . Colon cancer Neg Hx   . Esophageal cancer Neg Hx   . Breast cancer Neg Hx   . Rectal cancer Neg Hx   . Stomach cancer Neg Hx     Allergies  Allergen Reactions  . Tolmetin     Patient reports taking OTC ibuprofen and is not allergic     Current Outpatient Medications on File Prior to Visit  Medication Sig Dispense Refill  . pantoprazole (PROTONIX) 40 MG tablet Take 1 tablet (40 mg total) by mouth 2 (two) times daily. 60 tablet 5   No current facility-administered medications on file prior to visit.     There were no vitals taken for this visit.   Observations/Objective:   Gen: Awake, alert, no acute distress Resp: Breathing is even and non-labored Psych: calm/pleasant demeanor Neuro: Alert and Oriented x 3, + facial symmetry, speech is clear.   Assessment and Plan:  Menopausal syndrome-we will give trial of citalopram for hot flashes.  Depression- patient appears to be exhibiting some symptoms of depression which are likely aggravated by stress from pandemic as well as hormonal shifts from menopause.  We will give trial of citalopram 20 mg.  I advised the patient to begin half tab once daily for 7 days and increase to a full tab once daily.  Plan to bring the patient back into the office for a face-to-face visit in 1 month.  Patient is advised to reach out sooner should she have any questions or concerns.  Follow Up Instructions:    I discussed the assessment and treatment plan with the patient. The patient was provided an opportunity to ask questions and all were answered. The patient agreed with the plan and demonstrated an understanding  of the instructions.   The patient was advised to call back or seek an in-person evaluation if the symptoms worsen or if the condition fails to improve as anticipated.  Nance Pear, NP

## 2019-08-02 ENCOUNTER — Telehealth: Payer: Self-pay | Admitting: *Deleted

## 2019-08-02 MED ORDER — CITALOPRAM HYDROBROMIDE 20 MG PO TABS: 20.0000 mg | ORAL_TABLET | Freq: Every day | ORAL | 0 refills | Status: DC

## 2019-08-02 NOTE — Telephone Encounter (Signed)
[facility] sent over request for celexa.  Patient was to follow up in 4 weeks.  Tried calling to make an appointment and no answer and vm full.  Do you want to go ahead and refill for one month at 1 tablet a day?

## 2019-08-02 NOTE — Telephone Encounter (Signed)
I sent a 30 day refill but needs OV prior to additional refills.We could do a virtual visit next Monday if she would like, or she can come in.

## 2019-08-04 NOTE — Telephone Encounter (Signed)
Patient was scheduled for Wednesday for virtual visit

## 2019-08-10 ENCOUNTER — Telehealth (INDEPENDENT_AMBULATORY_CARE_PROVIDER_SITE_OTHER): Payer: PRIVATE HEALTH INSURANCE | Admitting: Family

## 2019-08-10 ENCOUNTER — Other Ambulatory Visit: Payer: Self-pay

## 2019-08-10 DIAGNOSIS — F329 Major depressive disorder, single episode, unspecified: Secondary | ICD-10-CM | POA: Diagnosis not present

## 2019-08-10 DIAGNOSIS — R232 Flushing: Secondary | ICD-10-CM

## 2019-08-10 DIAGNOSIS — F32A Depression, unspecified: Secondary | ICD-10-CM

## 2019-08-10 MED ORDER — CITALOPRAM HYDROBROMIDE 20 MG PO TABS: 20.0000 mg | ORAL_TABLET | Freq: Every day | ORAL | 1 refills | Status: DC

## 2019-08-10 NOTE — Progress Notes (Signed)
Virtual Visit via Video Note  I connected with Melissa Santiago on 08/10/19 at  7:40 AM EDT by a video enabled telemedicine application and verified that I am speaking with the correct person using two identifiers.  Location: Patient: home Provider: home   I discussed the limitations of evaluation and management by telemedicine and the availability of in person appointments. The patient expressed understanding and agreed to proceed.  History of Present Illness:  Patient is a 53 yr old female who presents today for follow up. Last visit we started citalopram to help with hot flashes as well as some depression symptoms she was experiencing.   She reports that she has noticed about a 50% reduction in her hot flashes since starting citalopram. She is pleased with this improvement.  Depression- she reports that overall she is handling stress better.  Mood is improved.  Feeling more motivated. Has stopped stress eating and has lost 5 pounds since her last visit.   Observations/Objective:   Gen: Awake, alert, no acute distress Resp: Breathing is even and non-labored Psych: calm/pleasant demeanor Neuro: Alert and Oriented x 3, + facial symmetry, speech is clear.   Assessment and Plan:  Hot flashes- improved with citalopram. Continue same.  Depression- improved with citalopram. Encouraged pt to keep up the good work with weight loss efforts. Continue current dose of citalopram.  She states that she will [facility] flu [facility].  Follow Up Instructions:    I discussed the assessment and treatment plan with the patient. The patient was provided an opportunity to ask questions and all were answered. The patient agreed with the plan and demonstrated an understanding of the instructions.   The patient was advised to call back or seek an in-person evaluation if the symptoms worsen or if the condition fails to improve as anticipated.  Nance Pear, NP

## 2019-10-03 ENCOUNTER — Encounter: Payer: Self-pay | Admitting: Family

## 2019-10-14 ENCOUNTER — Encounter: Payer: Self-pay | Admitting: Family

## 2019-10-14 NOTE — Telephone Encounter (Signed)
Patient scheduled for  virtual visit for Monday 12/21

## 2019-10-17 ENCOUNTER — Ambulatory Visit (INDEPENDENT_AMBULATORY_CARE_PROVIDER_SITE_OTHER): Payer: PRIVATE HEALTH INSURANCE | Admitting: Family

## 2019-10-17 ENCOUNTER — Encounter: Payer: Self-pay | Admitting: Family

## 2019-10-17 ENCOUNTER — Other Ambulatory Visit: Payer: Self-pay

## 2019-10-17 DIAGNOSIS — J329 Chronic sinusitis, unspecified: Secondary | ICD-10-CM

## 2019-10-17 DIAGNOSIS — U071 COVID-19: Secondary | ICD-10-CM

## 2019-10-17 DIAGNOSIS — H6691 Otitis media, unspecified, right ear: Secondary | ICD-10-CM

## 2019-10-17 MED ORDER — AMOXICILLIN-POT CLAVULANATE 875-125 MG PO TABS: 1.0000 | ORAL_TABLET | Freq: Two times a day (BID) | ORAL | 0 refills | Status: DC

## 2019-10-17 MED ORDER — FLUCONAZOLE 150 MG PO TABS: 150.0000 mg | ORAL_TABLET | Freq: Once | ORAL | 0 refills | Status: AC

## 2019-10-17 NOTE — Progress Notes (Signed)
Virtual Visit via Video Note  I connected with Melissa Santiago on 10/17/19 at  8:20 AM EST by a video enabled telemedicine application and verified that I am speaking with the correct person using two identifiers.  Location: Patient: home Provider: home   I discussed the limitations of evaluation and management by telemedicine and the availability of in person appointments. The patient expressed understanding and agreed to proceed.  History of Present Illness:  Patient is a 53 yr old female who presents today to discuss recent covid-19 diagnosis. She tested positive on 10/04/19.  She was diagnosed initially with Bronchitis.  She was treated with a zpak and albuterol (which helped with her breathing and chest discomfort."  Had HA, 102 fever, had chest pain from covid.   She went to the ED at [facility] on 10/08/19 due to SOB.   Reports no fever in 5 days. Chest pain has nearly resolved. Reports that her breathing is "way better." HA persists but is much better than it was.  She reports that she has developed some right ear pain as well as cheek pain, frontal headache and "aching in my teeth."    Reports that she has some right ear pain, + sinus pressure.  Gets winded if she goes up her stairs or if she talks a lot she also gets winded.  Reports that she needs a release to return to work.  Feels like she is ready to return but plans to take it easy upon her return.    Past Medical History:  Diagnosis Date  . Acute lumbar radiculopathy 06/29/2013  . Chest pain in adult 05/24/2018  . Chronic constipation 05/24/2018  . Gastroesophageal reflux disease 05/24/2018  . GERD (gastroesophageal reflux disease)   . Left sided sciatica 05/10/2013  . Lumbar disc herniation 06/29/2013  . Morbid obesity ([place]) 05/24/2018  . Obesity   . Osteopenia 03/23/2015  . S/P bariatric surgery 06/30/2013     Social History   Socioeconomic History  . Marital status: Married    Spouse name: [name]  . Number of children: 2  .  Years of education: Not on file  . Highest education level: Not on file  Occupational History  . Occupation: Sales promotion account executive  Tobacco Use  . Smoking status: Former Smoker    Packs/day: 0.50    Years: 15.00    Pack years: 7.50    Types: Cigarettes    Quit date: 2006    Years since quitting: 14.9  . Smokeless tobacco: Never Used  Substance and Sexual Activity  . Alcohol use: Yes    Comment: rarely  . Drug use: Never  . Sexual activity: Yes    Partners: Male    Birth control/protection: I.U.D.  Other Topics Concern  . Not on file  Social History Narrative   From [place], Divorced since 2015   Works as a Geophysical data processor for [employer]   2 daughters- one in the [employer]- lives in [place], one in [place]- works as a Pharmacist, hospital   Enjoys spending time with her husband   Has a dog and will adopt another soon.    Social Determinants of Health   Financial Resource Strain:   . Difficulty of Paying Living Expenses: Not on file  Food Insecurity:   . Worried About Charity fundraiser in the Last Year: Not on file  . Ran Out of Food in the Last Year: Not on file  Transportation Needs:   . Lack of Transportation (  Medical): Not on file  . Lack of Transportation (Non-Medical): Not on file  Physical Activity:   . Days of Exercise per Week: Not on file  . Minutes of Exercise per Session: Not on file  Stress:   . Feeling of Stress : Not on file  Social Connections:   . Frequency of Communication with Friends and Family: Not on file  . Frequency of Social Gatherings with Friends and Family: Not on file  . Attends Religious Services: Not on file  . Active Member of Clubs or Organizations: Not on file  . Attends Archivist Meetings: Not on file  . Marital Status: Not on file  Intimate Partner Violence:   . Fear of Current or Ex-Partner: Not on file  . Emotionally Abused: Not on file  . Physically Abused: Not on file  . Sexually Abused: Not on file    Past Surgical  History:  Procedure Laterality Date  . BREAST BIOPSY Left 2014  . [place] RESECTION  2014  . LUMBAR DISC SURGERY  2014/2016    Family History  Problem Relation Age of Onset  . Arthritis Mother   . Early death Mother   . Mental illness Mother   . Alcohol abuse Father   . Arthritis Father   . Early death Sister   . Thyroid cancer Sister   . Hypertension Sister   . Heart attack Maternal Grandfather   . Uterine cancer Sister   . Hypertension Sister   . Cervical cancer Sister   . Depression Sister   . Hypertension Sister   . Colon cancer Neg Hx   . Esophageal cancer Neg Hx   . Breast cancer Neg Hx   . Rectal cancer Neg Hx   . Stomach cancer Neg Hx     Allergies  Allergen Reactions  . Tolmetin     Patient reports taking OTC ibuprofen and is not allergic     Current Outpatient Medications on File Prior to Visit  Medication Sig Dispense Refill  . citalopram (CELEXA) 20 MG tablet Take 1 tablet (20 mg total) by mouth daily. 90 tablet 1  . pantoprazole (PROTONIX) 40 MG tablet Take 1 tablet (40 mg total) by mouth 2 (two) times daily. 60 tablet 5   No current facility-administered medications on file prior to visit.    There were no vitals taken for this visit.      Observations/Objective:   Gen: Awake, alert, no acute distress Resp: Breathing is even, mild conversational dyspnea Psych: calm/pleasant demeanor Neuro: Alert and Oriented x 3, + facial symmetry, speech is clear.   Assessment and Plan:  COVID-19 infection- clinically improving.  She is no longer contagious at this point.    Sinusitis/Right Otitis media- will rx with augmentin. Will also give rx for prn diflucan.    I have written her a return to work noted for 10/18/19. She plans to ease back into work and increase activity as tolerated.    Follow Up Instructions:    I discussed the assessment and treatment plan with the patient. The patient was provided an opportunity to ask  questions and all were answered. The patient agreed with the plan and demonstrated an understanding of the instructions.   The patient was advised to call back or seek an in-person evaluation if the symptoms worsen or if the condition fails to improve as anticipated.  Nance Pear, NP

## 2019-12-14 ENCOUNTER — Other Ambulatory Visit: Payer: Self-pay | Admitting: Gastroenterology

## 2020-10-27 HISTORY — PX: CHOLECYSTECTOMY: SHX55

## 2020-11-08 DIAGNOSIS — K219 Gastro-esophageal reflux disease without esophagitis: Secondary | ICD-10-CM | POA: Diagnosis not present

## 2020-11-08 DIAGNOSIS — M25562 Pain in left knee: Secondary | ICD-10-CM | POA: Diagnosis not present

## 2020-11-21 ENCOUNTER — Other Ambulatory Visit: Payer: Self-pay

## 2020-11-21 ENCOUNTER — Ambulatory Visit: Payer: BC Managed Care – PPO | Admitting: Family

## 2020-11-21 DIAGNOSIS — M5432 Sciatica, left side: Secondary | ICD-10-CM

## 2020-11-21 DIAGNOSIS — L989 Disorder of the skin and subcutaneous tissue, unspecified: Secondary | ICD-10-CM | POA: Diagnosis not present

## 2020-11-21 DIAGNOSIS — M25569 Pain in unspecified knee: Secondary | ICD-10-CM

## 2020-11-21 DIAGNOSIS — R232 Flushing: Secondary | ICD-10-CM | POA: Diagnosis not present

## 2020-11-21 DIAGNOSIS — F32A Depression, unspecified: Secondary | ICD-10-CM

## 2020-11-21 DIAGNOSIS — Z0000 Encounter for general adult medical examination without abnormal findings: Secondary | ICD-10-CM

## 2020-11-21 MED ORDER — OMEPRAZOLE 40 MG PO CPDR: 40.0000 mg | DELAYED_RELEASE_CAPSULE | Freq: Every day | ORAL | 1 refills | Status: DC

## 2020-11-21 MED ORDER — METHYLPREDNISOLONE 4 MG PO TBPK: ORAL_TABLET | ORAL | 0 refills | Status: DC

## 2020-11-21 NOTE — Progress Notes (Signed)
Virtual Visit via Video Note  I connected with Melissa Santiago on 11/21/20 at  7:40 AM EST by a video enabled telemedicine application and verified that I am speaking with the correct person using two identifiers.  Location: Patient: parked car Provider: home   I discussed the limitations of evaluation and management by telemedicine and the availability of in person appointments. The patient expressed understanding and agreed to proceed. Only the patient and myself were present for today's video call.   History of Present Illness:  Patient is a 55 year old female who presents today for follow-up.  Depression-she is no longer taking citalopram.  She reports her mood has been very good.  She reports that she started a new job recently which she is very glad about.  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  She enjoys the regular hours, weekends off, and time off when students are out of school.  Hot flashes-she reports that these have been improving as well.  She has been menopausal for 2 years.  She feels like she is getting used to the hot flashes and does not desire any further treatment for the symptoms.  She has a couple of orthopedic concerns today-she reports that  3 months ago she tripped at the bottom of the stairs.  She fell onto both knees.  She had sudden right knee pain  "but I shook it off."  She reports that she tried ibuprofen without improvement in her pain.  Went to urgent care- had an x-ray which reportedly looked ok.  She was treated with oral steroids. (Prednisone x5 days.  Reports last dose was 2 weeks ago).  Helped a little but now at the end of the day she can't even bend her knee.  Notes a "clicking" in her knee when she walks down the stairs.  Left knee.  Due to limping so much, her sciatic nerve is starting to flare up.  (left sided).  She reports that it feels "like a toothache all the way down the back of my leg to my foot."  She reports that she  has 2 new skin lesions on her left cheek.  Both of her daughters have pointed this out to her separately.  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 Observations/Objective:   Gen: Awake, alert, no acute distress Resp: Breathing is even and non-labored Psych: calm/pleasant demeanor Neuro: Alert and Oriented x 3, + facial symmetry, speech is clear. Skin: Unable to see skin lesions on her face due to poor resolution of camera.  Assessment and Plan:  Knee pain-will refer to orthopedics for further evaluation.  Sciatica-we will give trial of Medrol Dosepak.  This may also give her some temporary relief from the knee pain.  Would like to try to avoid NSAIDs due to her history of gastric sleeve.  Depression-currently stable off of medication.  We will continue to monitor.  Hot flashes stable/improved.  Continue.  Skin lesions-new will refer to dermatology for evaluation and skin check. Follow Up Instructions:    I discussed the assessment and treatment plan with the patient. The patient was provided an opportunity to ask questions and all were answered. The patient agreed with the plan and demonstrated an understanding of the instructions.   The patient was advised to call back or seek an in-person evaluation if the symptoms worsen or if the condition fails to improve as anticipated.  Nance Pear, NP

## 2020-12-04 ENCOUNTER — Ambulatory Visit (HOSPITAL_BASED_OUTPATIENT_CLINIC_OR_DEPARTMENT_OTHER)
Admission: RE | Admit: 2020-12-04 | Discharge: 2020-12-04 | Disposition: A | Discharge status: Home / Self Care | Payer: BC Managed Care – PPO | Source: Ambulatory Visit | Attending: Family | Admitting: Family

## 2020-12-04 ENCOUNTER — Encounter: Payer: Self-pay | Admitting: Orthopaedic Surgery

## 2020-12-04 ENCOUNTER — Other Ambulatory Visit: Payer: Self-pay

## 2020-12-04 ENCOUNTER — Ambulatory Visit: Payer: BC Managed Care – PPO | Admitting: Orthopaedic Surgery

## 2020-12-04 ENCOUNTER — Ambulatory Visit: Payer: Self-pay

## 2020-12-04 ENCOUNTER — Encounter (HOSPITAL_BASED_OUTPATIENT_CLINIC_OR_DEPARTMENT_OTHER): Payer: Self-pay

## 2020-12-04 VITALS — Ht 64.5 in | Wt 217.0 lb

## 2020-12-04 DIAGNOSIS — Z0000 Encounter for general adult medical examination without abnormal findings: Secondary | ICD-10-CM

## 2020-12-04 DIAGNOSIS — M25562 Pain in left knee: Secondary | ICD-10-CM

## 2020-12-04 DIAGNOSIS — Z1231 Encounter for screening mammogram for malignant neoplasm of breast: Secondary | ICD-10-CM | POA: Insufficient documentation

## 2020-12-04 NOTE — Progress Notes (Signed)
Office Visit Note   Patient: Melissa Santiago           Date of Birth: 05-04-1966           MRN: [id number] Visit Date: 12/04/2020              Requested by: [name], NP [address] [place],  [place] [address] PCP: [name], NP   Assessment & Plan: Visit Diagnoses:  1. Acute pain of left knee     Plan: Impression is chondromalacia patella and quad weakness.  Patient mainly wants reassurance that there are no structural normalities.  She declined physical therapy.  Overall it sounds like it is getting better with rest and NSAIDs.  We will see her back as needed  Follow-Up Instructions: Return if symptoms worsen or fail to improve.   Orders:  Orders Placed This Encounter  Procedures  . XR KNEE 3 VIEW LEFT   No orders of the defined types were placed in this encounter.     Procedures: No procedures performed   Clinical Data: No additional findings.   Subjective: Chief Complaint  Patient presents with  . Left Knee - Pain    Melissa Santiago is a 55 year old female here for evaluation of 4 weeks of left knee pain without injuries.  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  She states that pain is worse with flexion of the knee.  She has been taking ibuprofen which helps.  Denies any previous knee surgery.  She tried a prednisone Dosepak which helped take the edge off.  Denies any mechanical symptoms.   Review of Systems  Constitutional: Negative.   HENT: Negative.   Eyes: Negative.   Respiratory: Negative.   Cardiovascular: Negative.   Endocrine: Negative.   Musculoskeletal: Negative.   Neurological: Negative.   Hematological: Negative.   Psychiatric/Behavioral: Negative.   All other systems reviewed and are negative.    Objective: Vital Signs: Ht 5' 4.5" (1.638 m)   Wt 217 lb (98.4 kg)   BMI 36.67 kg/m   Physical Exam Vitals and nursing note  reviewed.  Constitutional:      Appearance: She is well-developed and well-nourished.  HENT:     Head: Normocephalic and atraumatic.  Eyes:     Extraocular Movements: EOM normal.  Pulmonary:     Effort: Pulmonary effort is normal.  Abdominal:     Palpations: Abdomen is soft.  Musculoskeletal:     Cervical back: Neck supple.  Skin:    General: Skin is warm.     Capillary Refill: Capillary refill takes less than 2 seconds.  Neurological:     Mental Status: She is alert and oriented to person, place, and time.  Psychiatric:        Mood and Affect: Mood and affect normal.        Behavior: Behavior normal.        Thought Content: Thought content normal.        Judgment: Judgment normal.     Ortho Exam Left knee shows no joint effusion.  Collaterals and cruciates are stable.  No significant crepitus.  No joint line tenderness. Specialty Comments:  No specialty comments available.  Imaging: XR KNEE 3 VIEW LEFT  Result Date: 12/04/2020 No acute or structural abnormalities.    PMFS History: Patient Active Problem List   Diagnosis Date Noted  .  Chest pain in adult 05/24/2018  . Morbid obesity ([place]) 05/24/2018  . Gastroesophageal reflux disease 05/24/2018  . Chronic constipation 05/24/2018  . Osteopenia 03/23/2015  . S/P bariatric surgery 06/30/2013  . Lumbar disc herniation 06/29/2013  . Acute lumbar radiculopathy 06/29/2013  . Left sided sciatica 05/10/2013   Past Medical History:  Diagnosis Date  . Acute lumbar radiculopathy 06/29/2013  . Chest pain in adult 05/24/2018  . Chronic constipation 05/24/2018  . Gastroesophageal reflux disease 05/24/2018  . GERD (gastroesophageal reflux disease)   . Left sided sciatica 05/10/2013  . Lumbar disc herniation 06/29/2013  . Morbid obesity ([place]) 05/24/2018  . Obesity   . Osteopenia 03/23/2015  . S/P bariatric surgery 06/30/2013    Family History  Problem Relation Age of Onset  . Arthritis Mother   . Early death Mother   . Mental  illness Mother   . Alcohol abuse Father   . Arthritis Father   . Early death Sister   . Thyroid cancer Sister   . Hypertension Sister   . Heart attack Maternal Grandfather   . Uterine cancer Sister   . Hypertension Sister   . Cervical cancer Sister   . Depression Sister   . Hypertension Sister   . Colon cancer Neg Hx   . Esophageal cancer Neg Hx   . Breast cancer Neg Hx   . Rectal cancer Neg Hx   . Stomach cancer Neg Hx     Past Surgical History:  Procedure Laterality Date  . BREAST BIOPSY Left 2014  . [place] RESECTION  2014  . LUMBAR [place] SURGERY  2014/2016   Social History   Occupational History  . Occupation: Sales promotion account executive  Tobacco Use  . Smoking status: Former Smoker    Packs/day: 0.50    Years: 15.00    Pack years: 7.50    Types: Cigarettes    Quit date: 2006    Years since quitting: 16.1  . Smokeless tobacco: Never Used  Vaping Use  . Vaping Use: Never used  Substance and Sexual Activity  . Alcohol use: Yes    Comment: rarely  . Drug use: Never  . Sexual activity: Yes    Partners: Male    Birth control/protection: I.U.D.

## 2020-12-14 ENCOUNTER — Ambulatory Visit: Payer: BC Managed Care – PPO | Admitting: Family

## 2020-12-14 ENCOUNTER — Other Ambulatory Visit: Payer: Self-pay

## 2020-12-14 ENCOUNTER — Other Ambulatory Visit (HOSPITAL_COMMUNITY)
Admission: RE | Admit: 2020-12-14 | Discharge: 2020-12-14 | Disposition: A | Discharge status: Home / Self Care | Payer: BC Managed Care – PPO | Source: Ambulatory Visit | Attending: Family | Admitting: Family

## 2020-12-14 VITALS — BP 99/72 | HR 78 | Temp 98.9°F | Resp 16 | Ht 65.0 in | Wt 220.0 lb

## 2020-12-14 DIAGNOSIS — N393 Stress incontinence (female) (male): Secondary | ICD-10-CM

## 2020-12-14 DIAGNOSIS — R350 Frequency of micturition: Secondary | ICD-10-CM | POA: Diagnosis not present

## 2020-12-14 DIAGNOSIS — N3281 Overactive bladder: Secondary | ICD-10-CM

## 2020-12-14 DIAGNOSIS — K219 Gastro-esophageal reflux disease without esophagitis: Secondary | ICD-10-CM | POA: Diagnosis not present

## 2020-12-14 DIAGNOSIS — Z01419 Encounter for gynecological examination (general) (routine) without abnormal findings: Secondary | ICD-10-CM | POA: Insufficient documentation

## 2020-12-14 DIAGNOSIS — Z0001 Encounter for general adult medical examination with abnormal findings: Secondary | ICD-10-CM

## 2020-12-14 DIAGNOSIS — E871 Hypo-osmolality and hyponatremia: Secondary | ICD-10-CM | POA: Diagnosis not present

## 2020-12-14 DIAGNOSIS — Z30432 Encounter for removal of intrauterine contraceptive device: Secondary | ICD-10-CM

## 2020-12-14 DIAGNOSIS — Z0000 Encounter for general adult medical examination without abnormal findings: Secondary | ICD-10-CM

## 2020-12-14 LAB — COMPREHENSIVE METABOLIC PANEL
ALT: 19 U/L (ref 0–35)
AST: 19 U/L (ref 0–37)
Albumin: 3.9 g/dL (ref 3.5–5.2)
Alkaline Phosphatase: 73 U/L (ref 39–117)
BUN: 19 mg/dL (ref 6–23)
CO2: 31 mEq/L (ref 19–32)
Calcium: 9 mg/dL (ref 8.4–10.5)
Chloride: 104 mEq/L (ref 96–112)
Creatinine, Ser: 0.64 mg/dL (ref 0.40–1.20)
GFR: 101.36 mL/min (ref >60.00)
Glucose, Bld: 88 mg/dL (ref 70–99)
Potassium: 4.6 mEq/L (ref 3.5–5.1)
Sodium: 140 mEq/L (ref 135–145)
Total Bilirubin: 0.4 mg/dL (ref 0.2–1.2)
Total Protein: 6 g/dL (ref 6.0–8.3)

## 2020-12-14 LAB — URINALYSIS, ROUTINE W REFLEX MICROSCOPIC
Bilirubin Urine: NEGATIVE
Hgb urine dipstick: NEGATIVE
Ketones, ur: NEGATIVE
Leukocytes,Ua: NEGATIVE
Nitrite: NEGATIVE
RBC / HPF: NONE SEEN (ref 0–?)
Specific Gravity, Urine: 1.01 (ref 1.000–1.030)
Total Protein, Urine: NEGATIVE
Urine Glucose: NEGATIVE
Urobilinogen, UA: 0.2 (ref 0.0–1.0)
WBC, UA: NONE SEEN (ref 0–?)
pH: 7 (ref 5.0–8.0)

## 2020-12-14 LAB — LIPID PANEL
Cholesterol: 229 mg/dL — ABNORMAL HIGH (ref 0–200)
HDL: 82.2 mg/dL (ref >39.00)
LDL Cholesterol: 133 mg/dL — ABNORMAL HIGH (ref 0–99)
NonHDL: 146.39
Total CHOL/HDL Ratio: 3
Triglycerides: 69 mg/dL (ref 0.0–149.0)
VLDL: 13.8 mg/dL (ref 0.0–40.0)

## 2020-12-14 MED ORDER — MIRABEGRON ER 25 MG PO TB24: 25.0000 mg | ORAL_TABLET | Freq: Every day | ORAL | 3 refills | Status: DC

## 2020-12-14 MED ORDER — PANTOPRAZOLE SODIUM 40 MG PO TBEC
40.0000 mg | DELAYED_RELEASE_TABLET | Freq: Two times a day (BID) | ORAL | 3 refills | Status: DC
Start: 2020-12-14 — End: 2020-12-21

## 2020-12-14 NOTE — Progress Notes (Signed)
Subjective:    Patient ID: Melissa Santiago, female    DOB: 07/16/1966, 55 y.o.   MRN: [id number]  HPI  Patient presents today for complete physical.  Immunizations:  Up to date. Due for shingrix.  Diet: Wt Readings from Last 3 Encounters:  12/14/20 220 lb (99.8 kg)  12/04/20 217 lb (98.4 kg)  05/03/19 225 lb (102.1 kg)  Exercise:  Not exercising formally Colonoscopy:  03/18/19 Pap Smear: due Mammogram: 12/04/2020 Vision: up to date Dental:up to date  Reports that her acid has been "horrible." Reports that she is taking omeprazole "like m and m's" She does not eat after 5:30, no carbonation.  Wakes up vomiting, afraid she may be aspirating. She has had a gastric sleeve procedure.  She is sleeping on a wedge pillow as well.   Pt has copper IUD. It has been in place for 5 years. She has not had a period in 2 years. She has regular hot flashes.  C/o stress incontinence- urge incontinence. "I always feel like I have to go."    Review of Systems  Constitutional: Negative for unexpected weight change.  HENT: Negative for hearing loss and rhinorrhea.   Eyes: Negative for visual disturbance.  Respiratory: Negative for cough and shortness of breath.   Cardiovascular: Negative for chest pain.  Gastrointestinal: Negative for constipation and diarrhea.  Genitourinary: Positive for frequency and urgency. Negative for dysuria.  Musculoskeletal: Positive for arthralgias and back pain. Negative for myalgias.  Skin: Negative for rash.  Neurological: Negative for headaches.  Hematological: Negative for adenopathy.  Psychiatric/Behavioral:       Denies depression or anxiety       Past Medical History:  Diagnosis Date  . Acute lumbar radiculopathy 06/29/2013  . Chest pain in adult 05/24/2018  . Chronic constipation 05/24/2018  . Gastroesophageal reflux disease 05/24/2018  . GERD (gastroesophageal reflux disease)   . Left sided sciatica 05/10/2013  . Lumbar disc herniation 06/29/2013  . Morbid  obesity ([place]) 05/24/2018  . Obesity   . Osteopenia 03/23/2015  . S/P bariatric surgery 06/30/2013     Social History   Socioeconomic History  . Marital status: Married    Spouse name: [name]  . Number of children: 2  . Years of education: Not on file  . Highest education level: Not on file  Occupational History  . Occupation: Sales promotion account executive  Tobacco Use  . Smoking status: Former Smoker    Packs/day: 0.50    Years: 15.00    Pack years: 7.50    Types: Cigarettes    Quit date: 2006    Years since quitting: 16.1  . Smokeless tobacco: Never Used  Vaping Use  . Vaping Use: Never used  Substance and Sexual Activity  . Alcohol use: Yes    Comment: rarely  . Drug use: Never  . Sexual activity: Yes    Partners: Male    Birth control/protection: I.U.D.  Other Topics Concern  . Not on file  Social History Narrative   From [place], Divorced since 2015   Works as a Geophysical data processor for [employer]   2 daughters- one in the [employer]- lives in [place], one in [place]- works as a Pharmacist, hospital   Enjoys spending time with her husband   Has a dog and will adopt another soon.    Social Determinants of Health   Financial Resource Strain: Not on file  Food Insecurity: Not on file  Transportation Needs: Not on file  Physical Activity:  Not on file  Stress: Not on file  Social Connections: Not on file  Intimate Partner Violence: Not on file    Past Surgical History:  Procedure Laterality Date  . BREAST BIOPSY Left 2014  . [place] RESECTION  2014  . LUMBAR DISC SURGERY  2014/2016    Family History  Problem Relation Age of Onset  . Arthritis Mother   . Early death Mother   . Mental illness Mother   . Alcohol abuse Father   . Arthritis Father   . Early death Sister   . Thyroid cancer Sister   . Hypertension Sister   . Heart attack Maternal Grandfather   . Uterine cancer Sister   . Hypertension Sister   . Cervical cancer Sister   . Depression Sister    . Hypertension Sister   . Colon cancer Neg Hx   . Esophageal cancer Neg Hx   . Breast cancer Neg Hx   . Rectal cancer Neg Hx   . Stomach cancer Neg Hx     Allergies  Allergen Reactions  . Tolmetin     Patient reports taking OTC ibuprofen and is not allergic     Current Outpatient Medications on File Prior to Visit  Medication Sig Dispense Refill  . omeprazole (PRILOSEC) 40 MG capsule Take 1 capsule (40 mg total) by mouth daily. 90 capsule 1   No current facility-administered medications on file prior to visit.    BP 99/72 (BP Location: Right Arm, Patient Position: Sitting, Cuff Size: Small)   Pulse 78   Temp 98.9 F (37.2 C) (Oral)   Resp 16   Ht 5\' 5"  (1.651 m)   Wt 220 lb (99.8 kg)   SpO2 99%   BMI 36.61 kg/m    Objective:   Physical Exam Physical Exam  Constitutional: She is oriented to person, place, and time. She appears well-developed and well-nourished. No distress.  HENT:  Head: Normocephalic and atraumatic.  Right Ear: Tympanic membrane and ear canal normal.  Left Ear: Tympanic membrane and ear canal normal.  Mouth/Throat: not examined- pt wearing mask Eyes: Pupils are equal, round, and reactive to light. No scleral icterus.  Neck: Normal range of motion. No thyromegaly present.  Cardiovascular: Normal rate and regular rhythm.   No murmur heard. Pulmonary/Chest: [place] normal and breath sounds normal. No respiratory distress. He has no wheezes. She has no rales. She exhibits no tenderness.  Abdominal: Soft. Bowel sounds are normal. She exhibits no distension and no mass. There is no tenderness. There is no rebound and no guarding.  Musculoskeletal: She exhibits no edema.  Lymphadenopathy:    She has no cervical adenopathy.  Neurological: She is alert and oriented to person, place, and time. She has normal patellar reflexes. She exhibits normal muscle tone. Coordination normal.  Skin: Skin is warm and dry.  Psychiatric: She has a normal mood and affect.  Her behavior is normal. Judgment and thought content normal.  Breast: deferred Inguinal/mons: Normal without inguinal adenopathy  External genitalia: Normal  BUS/Urethra/Skene's glands: Normal  Bladder: Normal  Vagina: Normal  Cervix: Normal  Uterus: normal in size, shape and contour. Midline and mobile  Adnexa/parametria:  Rt: Without masses or tenderness.  Lt: Without masses or tenderness.  Anus and perineum: Normal            Assessment & Plan:   Preventative care- discussed healthy diet, exercise, weight loss. She wishes to schedule shingrix shot for another day. Mammo and colo up to  date.   Overactive bladder/stress incontinence- will check UA/Culture. Trial of myrbetriq.  GERD- uncontrolled. D/c omeprazole, begin protonix 40 mg twice daily.   Encounter for IUD removal- after verbal consent was obtained, IUD was removed gently with forceps. Pt tolerated without difficulty.   This visit occurred during the SARS-CoV-2 public health emergency.  Safety protocols were in place, including screening questions prior to the visit, additional usage of staff PPE, and extensive cleaning of exam room while observing appropriate contact time as indicated for disinfecting solutions.         Assessment & Plan:

## 2020-12-14 NOTE — Patient Instructions (Signed)
Stop omeprazole. Start pantoprozole 40 mg twice daily.  Start mybetriq for bladder.

## 2020-12-15 LAB — URINE CULTURE
MICRO NUMBER:: 11552294
Result:: NO GROWTH
SPECIMEN QUALITY:: ADEQUATE

## 2020-12-17 LAB — CYTOLOGY - PAP: Diagnosis: NEGATIVE

## 2020-12-18 ENCOUNTER — Encounter: Payer: Self-pay | Admitting: Family

## 2020-12-18 DIAGNOSIS — Z903 Acquired absence of stomach [part of]: Secondary | ICD-10-CM

## 2020-12-20 ENCOUNTER — Encounter: Payer: Self-pay | Admitting: Family

## 2020-12-21 MED ORDER — OMEPRAZOLE 40 MG PO CPDR: 40.0000 mg | DELAYED_RELEASE_CAPSULE | Freq: Two times a day (BID) | ORAL | 1 refills | Status: DC

## 2020-12-25 ENCOUNTER — Ambulatory Visit: Payer: BC Managed Care – PPO | Admitting: Family

## 2021-01-17 ENCOUNTER — Other Ambulatory Visit: Payer: Self-pay

## 2021-01-17 MED ORDER — OMEPRAZOLE 40 MG PO CPDR: 40.0000 mg | DELAYED_RELEASE_CAPSULE | Freq: Two times a day (BID) | ORAL | 1 refills | Status: DC

## 2021-01-31 DIAGNOSIS — Z903 Acquired absence of stomach [part of]: Secondary | ICD-10-CM | POA: Diagnosis not present

## 2021-01-31 DIAGNOSIS — K219 Gastro-esophageal reflux disease without esophagitis: Secondary | ICD-10-CM | POA: Diagnosis not present

## 2021-01-31 DIAGNOSIS — K30 Functional dyspepsia: Secondary | ICD-10-CM | POA: Diagnosis not present

## 2021-02-08 DIAGNOSIS — K219 Gastro-esophageal reflux disease without esophagitis: Secondary | ICD-10-CM | POA: Diagnosis not present

## 2021-02-08 DIAGNOSIS — Z981 Arthrodesis status: Secondary | ICD-10-CM | POA: Diagnosis not present

## 2021-02-08 DIAGNOSIS — Z903 Acquired absence of stomach [part of]: Secondary | ICD-10-CM | POA: Diagnosis not present

## 2021-03-12 DIAGNOSIS — R12 Heartburn: Secondary | ICD-10-CM | POA: Diagnosis not present

## 2021-03-12 DIAGNOSIS — Z9884 Bariatric surgery status: Secondary | ICD-10-CM | POA: Diagnosis not present

## 2021-03-12 DIAGNOSIS — R1110 Vomiting, unspecified: Secondary | ICD-10-CM | POA: Diagnosis not present

## 2021-03-12 DIAGNOSIS — Z01818 Encounter for other preprocedural examination: Secondary | ICD-10-CM | POA: Diagnosis not present

## 2021-03-12 DIAGNOSIS — R0989 Other specified symptoms and signs involving the circulatory and respiratory systems: Secondary | ICD-10-CM | POA: Diagnosis not present

## 2021-03-12 DIAGNOSIS — K219 Gastro-esophageal reflux disease without esophagitis: Secondary | ICD-10-CM | POA: Diagnosis not present

## 2021-03-13 DIAGNOSIS — K219 Gastro-esophageal reflux disease without esophagitis: Secondary | ICD-10-CM | POA: Diagnosis not present

## 2021-03-22 DIAGNOSIS — R519 Headache, unspecified: Secondary | ICD-10-CM | POA: Diagnosis not present

## 2021-03-22 DIAGNOSIS — R410 Disorientation, unspecified: Secondary | ICD-10-CM | POA: Diagnosis not present

## 2021-03-26 DIAGNOSIS — K219 Gastro-esophageal reflux disease without esophagitis: Secondary | ICD-10-CM | POA: Diagnosis not present

## 2021-04-09 ENCOUNTER — Other Ambulatory Visit: Payer: Self-pay | Admitting: Family

## 2021-04-11 DIAGNOSIS — K312 Hourglass stricture and stenosis of stomach: Secondary | ICD-10-CM | POA: Diagnosis not present

## 2021-04-11 DIAGNOSIS — T17908D Unspecified foreign body in respiratory tract, part unspecified causing other injury, subsequent encounter: Secondary | ICD-10-CM | POA: Diagnosis not present

## 2021-04-11 DIAGNOSIS — Z903 Acquired absence of stomach [part of]: Secondary | ICD-10-CM | POA: Diagnosis not present

## 2021-04-11 DIAGNOSIS — K219 Gastro-esophageal reflux disease without esophagitis: Secondary | ICD-10-CM | POA: Diagnosis not present

## 2021-04-23 DIAGNOSIS — K312 Hourglass stricture and stenosis of stomach: Secondary | ICD-10-CM | POA: Diagnosis not present

## 2021-04-23 DIAGNOSIS — K8020 Calculus of gallbladder without cholecystitis without obstruction: Secondary | ICD-10-CM | POA: Diagnosis not present

## 2021-04-30 DIAGNOSIS — M26622 Arthralgia of left temporomandibular joint: Secondary | ICD-10-CM | POA: Diagnosis not present

## 2021-05-03 ENCOUNTER — Other Ambulatory Visit: Payer: Self-pay

## 2021-05-03 ENCOUNTER — Ambulatory Visit: Payer: BC Managed Care – PPO | Admitting: Family

## 2021-05-03 VITALS — BP 122/76 | HR 67 | Temp 98.0°F | Resp 16 | Ht 65.0 in | Wt 230.0 lb

## 2021-05-03 DIAGNOSIS — F419 Anxiety disorder, unspecified: Secondary | ICD-10-CM

## 2021-05-03 DIAGNOSIS — K219 Gastro-esophageal reflux disease without esophagitis: Secondary | ICD-10-CM

## 2021-05-03 DIAGNOSIS — R160 Hepatomegaly, not elsewhere classified: Secondary | ICD-10-CM | POA: Diagnosis not present

## 2021-05-03 DIAGNOSIS — F32A Depression, unspecified: Secondary | ICD-10-CM | POA: Diagnosis not present

## 2021-05-03 MED ORDER — DULOXETINE HCL 30 MG PO CPEP: ORAL_CAPSULE | ORAL | 0 refills | Status: DC

## 2021-05-03 NOTE — Assessment & Plan Note (Signed)
Uncontrolled.  Will initiate cymbalta 30mg  caps daily for 3 days, then increase to 2 caps once daily.

## 2021-05-03 NOTE — Progress Notes (Signed)
Subjective:   By signing my name below, I, [name], attest that this documentation has been prepared under the direction and in the presence of [name]. 05/03/2021     Patient ID: Melissa Santiago, female    DOB: 1966/01/18, 55 y.o.   MRN: [id number]  Chief Complaint  Patient presents with   Anxiety    Patient complains of increased anxiety   Temporomandibular Joint Pain    Patient was diagnosed with tmj at urgent care and taking prednisone, still having pain   Gastroesophageal Reflux    Being treated by bariatric surgeon   Liver mass    New diagnosis, seen on CT    HPI Patient is in today for a office visit. She had a CT scan on 6/28 at [facility]. Results showed liver mass 3.3 cm x 3.5 cm. She also has Gallstones as well. Anxiety- She has elevated stress due to her health issues. She recently quit her job at [employer] because of the stress it was causing her. She was having a constant episode of lacrimation while explaining her stress. She has taken citalopram previously but stopped taking it due to her mood improving and her hot flashes improving as well.  She also complains that she is losing her focus recently. 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 Jaw- She complains of jaw pain. She has had no prior history of jaw pain. She does not have tooth pain at this time. She does not have wisdom teeth. She thinks its because of clenching her jaw and shouting at people constantly.  Health Maintenance Due  Topic Date Due   HIV Screening  Never done   Hepatitis C Screening  Never done   Zoster Vaccines- Shingrix (1 of 2) Never done   COVID-19 Vaccine (4 - Booster for Moderna series) 01/01/2021    Past Medical History:  Diagnosis Date   Acute lumbar radiculopathy 06/29/2013   Chest pain in adult 05/24/2018   Chronic constipation 05/24/2018    Gastroesophageal reflux disease 05/24/2018   GERD (gastroesophageal reflux disease)    Left sided sciatica 05/10/2013   Lumbar disc herniation 06/29/2013   Morbid obesity ([place]) 05/24/2018   Obesity    Osteopenia 03/23/2015   S/P bariatric surgery 06/30/2013    Past Surgical History:  Procedure Laterality Date   BREAST BIOPSY Left 2014   [place] RESECTION  2014   LUMBAR [place] SURGERY  2014/2016    Family History  Problem Relation Age of Onset   Arthritis Mother    Early death Mother    Mental illness Mother    Alcohol abuse Father    Arthritis Father    Early death Sister    Thyroid cancer Sister    Hypertension Sister    Heart attack Maternal Grandfather    Uterine cancer Sister    Hypertension Sister    Cervical cancer Sister    Depression Sister    Hypertension Sister    Colon cancer Neg Hx    Esophageal cancer Neg Hx    Breast cancer Neg Hx    Rectal cancer Neg Hx    Stomach cancer Neg Hx     Social History   Socioeconomic History   Marital status: Married    Spouse name: [name]   Number of children: 2   Years of education: Not on  file   Highest education level: Not on file  Occupational History   Occupation: Sales promotion account executive  Tobacco Use   Smoking status: Former    Packs/day: 0.50    Years: 15.00    Pack years: 7.50    Types: Cigarettes    Quit date: 2006    Years since quitting: 16.5   Smokeless tobacco: Never  Vaping Use   Vaping Use: Never used  Substance and Sexual Activity   Alcohol use: Yes    Comment: rarely   Drug use: Never   Sexual activity: Yes    Partners: Male    Birth control/protection: I.U.D.  Other Topics Concern   Not on file  Social History Narrative   From [place], Divorced since 2015   Works as a Geophysical data processor for [employer]   2 daughters- one in the [employer]- lives in [place], one in [place]- works as a Pharmacist, hospital   Enjoys spending time with her husband   Has a dog and will adopt another soon.     Social Determinants of Health   Financial Resource Strain: Not on file  Food Insecurity: Not on file  Transportation Needs: Not on file  Physical Activity: Not on file  Stress: Not on file  Social Connections: Not on file  Intimate Partner Violence: Not on file    Outpatient Medications Prior to Visit  Medication Sig Dispense Refill   predniSONE (DELTASONE) 5 MG tablet Take 5 mg by mouth daily with breakfast.     mirabegron ER (MYRBETRIQ) 25 MG TB24 tablet Take 1 tablet (25 mg total) by mouth daily. 30 tablet 3   omeprazole (PRILOSEC) 40 MG capsule Take 1 capsule (40 mg total) by mouth in the morning and at bedtime. 180 capsule 1   No facility-administered medications prior to visit.    Allergies  Allergen Reactions   Tolmetin     Patient reports taking OTC ibuprofen and is not allergic     Review of Systems  Constitutional:        (+)Lacrimation  Psychiatric/Behavioral:  Positive for depression. The patient is nervous/anxious.       Objective:    Physical Exam Constitutional:      General: She is in acute distress.     Appearance: Normal appearance. She is not ill-appearing.  HENT:     Head: Normocephalic and atraumatic.     Right Ear: External ear normal.     Left Ear: External ear normal.  Eyes:     Extraocular Movements: Extraocular movements intact.     Pupils: Pupils are equal, round, and reactive to light.  Cardiovascular:     Rate and Rhythm: Normal rate and regular rhythm.     Pulses: Normal pulses.     Heart sounds: Normal heart sounds. No murmur heard.   No gallop.  Pulmonary:     Effort: Pulmonary effort is normal. No respiratory distress.     Breath sounds: Normal breath sounds. No wheezing, rhonchi or rales.  Skin:    General: Skin is warm and dry.  Neurological:     Mental Status: She is alert and oriented to person, place, and time.  Psychiatric:        Behavior: Behavior normal.     Comments: Tearful depressed affect    BP 122/76 (BP  Location: Right Arm, Patient Position: Sitting, Cuff Size: Small)   Pulse 67   Temp 98 F (36.7 C) (Oral)   Resp 16   Ht 5'  5" (1.651 m)   Wt 230 lb (104.3 kg)   SpO2 98%   BMI 38.27 kg/m  Wt Readings from Last 3 Encounters:  05/03/21 230 lb (104.3 kg)  12/14/20 220 lb (99.8 kg)  12/04/20 217 lb (98.4 kg)       Assessment & Plan:   Problem List Items Addressed This Visit       Unprioritized   Liver mass - Primary    She has an MRI scheduled per bariatric surgeon for further evluation.        Gastroesophageal reflux disease    Uncontrolled. She is planning a revision with bariatric surgery.        Anxiety and depression    Uncontrolled.  Will initiate cymbalta 30mg  caps daily for 3 days, then increase to 2 caps once daily.       Relevant Medications   DULoxetine (CYMBALTA) 30 MG capsule   28 minutes spent on today's exam. The majority of this time was spent on counseling.    Meds ordered this encounter  Medications   DULoxetine (CYMBALTA) 30 MG capsule    Sig: Take 1 capsule by mouth once daily, then increase to 2 capsules once daly on day 4    Dispense:  30 capsule    Refill:  0    Order Specific Question:   Supervising Provider    Answer:   Penni Homans A [[id number]]    I, [name], personally preformed the services described in this documentation.  All medical record entries made by the scribe were at my direction and in my presence.  I have reviewed the chart and discharge instructions (if applicable) and agree that the record reflects my personal performance and is accurate and complete. 05/03/2021   I,[name],acting as a scribe for Nance Pear, NP.,have documented all relevant documentation on the behalf of Nance Pear, NP,as directed by  Nance Pear, NP while in the presence of Nance Pear, NP.   Nance Pear, NP

## 2021-05-03 NOTE — Assessment & Plan Note (Signed)
She has an MRI scheduled per bariatric surgeon for further evluation.

## 2021-05-03 NOTE — Patient Instructions (Signed)
Please start cymbalta 1 capsule daily for 3 days, then increase to 2 capsules once daily.

## 2021-05-03 NOTE — Assessment & Plan Note (Signed)
Uncontrolled. She is planning a revision with bariatric surgery.

## 2021-05-20 ENCOUNTER — Other Ambulatory Visit: Payer: Self-pay | Admitting: Family

## 2021-05-20 MED ORDER — DULOXETINE HCL 30 MG PO CPEP: ORAL_CAPSULE | ORAL | 0 refills | Status: DC

## 2021-05-20 NOTE — Telephone Encounter (Signed)
Patient has an appt on 06/04/21 with Melissa.  Can we send in for Cymbalta 60 1qd.  Patient comment: I'm still in [place] with my daughter. I believe this med has made me feel leaps better! If you would refill this for me that would be wonderful.Marland KitchenMarland KitchenSilver Cliff.

## 2021-05-25 ENCOUNTER — Other Ambulatory Visit: Payer: Self-pay | Admitting: Family

## 2021-05-28 ENCOUNTER — Other Ambulatory Visit: Payer: Self-pay | Admitting: Family Medicine

## 2021-05-30 ENCOUNTER — Encounter: Payer: Self-pay | Admitting: Family

## 2021-06-04 ENCOUNTER — Ambulatory Visit: Payer: BC Managed Care – PPO | Admitting: Family

## 2021-06-17 ENCOUNTER — Other Ambulatory Visit: Payer: Self-pay

## 2021-06-17 ENCOUNTER — Ambulatory Visit: Payer: 59 | Admitting: Family

## 2021-06-17 VITALS — BP 112/77 | HR 77 | Temp 98.0°F | Resp 16 | Wt 241.0 lb

## 2021-06-17 DIAGNOSIS — R160 Hepatomegaly, not elsewhere classified: Secondary | ICD-10-CM | POA: Diagnosis not present

## 2021-06-17 DIAGNOSIS — F419 Anxiety disorder, unspecified: Secondary | ICD-10-CM

## 2021-06-17 DIAGNOSIS — N3281 Overactive bladder: Secondary | ICD-10-CM | POA: Diagnosis not present

## 2021-06-17 DIAGNOSIS — F32A Depression, unspecified: Secondary | ICD-10-CM

## 2021-06-17 DIAGNOSIS — H6122 Impacted cerumen, left ear: Secondary | ICD-10-CM | POA: Diagnosis not present

## 2021-06-17 MED ORDER — DULOXETINE HCL 60 MG PO CPEP: 60.0000 mg | ORAL_CAPSULE | Freq: Every day | ORAL | 1 refills | Status: DC

## 2021-06-17 NOTE — Assessment & Plan Note (Signed)
New.  After verbal consent CMA flushed left ear.  Wax is removed by syringing. Pt tolerated procedure.

## 2021-06-17 NOTE — Progress Notes (Signed)
Subjective:   By signing my name below, I, [name], attest that this documentation has been prepared under the direction and in the presence of [name]. 06/17/2021    Patient ID: Melissa Santiago, female    DOB: Feb 24, 1966, 55 y.o.   MRN: [id number]  Chief Complaint  Patient presents with   Anxiety    "Doing much better on medication"   Depression    Here for follow up, "doing much better"    HPI Patient is in today for a office visit.  MRI- She reports having a MRI from her bariatric specialists order in [place].  Urination- She reports that she did not take 25 mg mirabegron daily PO for the past month because she was in another state. She reports having issues while out of town and thinks it is due to taking 30 mg Cymbalta daily PO.  TMJ- She feels like something is moving in her ear for the past month. She notes there is no pain except for when she was riding in an airplane.  Mood- She reports that her mood has improved while taking 30 mg Cymbalta daily PP. She also notes having a new job which has removed much of her daily stress.    Health Maintenance Due  Topic Date Due   HIV Screening  Never done   Hepatitis C Screening  Never done   Zoster Vaccines- Shingrix (1 of 2) Never done   COVID-19 Vaccine (4 - Booster for Moderna series) 01/01/2021   INFLUENZA VACCINE  05/27/2021    Past Medical History:  Diagnosis Date   Acute lumbar radiculopathy 06/29/2013   Chest pain in adult 05/24/2018   Chronic constipation 05/24/2018   Gastroesophageal reflux disease 05/24/2018   GERD (gastroesophageal reflux disease)    Left sided sciatica 05/10/2013   Lumbar disc herniation 06/29/2013   Morbid obesity ([place]) 05/24/2018   Obesity    Osteopenia 03/23/2015   S/P bariatric surgery 06/30/2013    Past Surgical History:  Procedure Laterality Date   BREAST BIOPSY Left 2014   [place] RESECTION  2014   LUMBAR [place] SURGERY  2014/2016    Family History   Problem Relation Age of Onset   Arthritis Mother    Early death Mother    Mental illness Mother    Alcohol abuse Father    Arthritis Father    Early death Sister    Thyroid cancer Sister    Hypertension Sister    Heart attack Maternal Grandfather    Uterine cancer Sister    Hypertension Sister    Cervical cancer Sister    Depression Sister    Hypertension Sister    Colon cancer Neg Hx    Esophageal cancer Neg Hx    Breast cancer Neg Hx    Rectal cancer Neg Hx    Stomach cancer Neg Hx     Social History   Socioeconomic History   Marital status: Married    Spouse name: [name]   Number of children: 2   Years of education: Not on file   Highest education level: Not on file  Occupational History   Occupation: Sales promotion account executive  Tobacco Use   Smoking status: Former    Packs/day: 0.50    Years: 15.00    Pack years: 7.50    Types: Cigarettes    Quit date: 2006    Years since quitting: 16.6   Smokeless tobacco: Never  Vaping Use   Vaping Use: Never used  Substance and Sexual Activity   Alcohol use: Yes    Comment: rarely   Drug use: Never   Sexual activity: Yes    Partners: Male    Birth control/protection: I.U.D.  Other Topics Concern   Not on file  Social History Narrative   From [place], Divorced since 2015   Works as a Geophysical data processor for [employer]   2 daughters- one in the [employer]- lives in [place], one in [place]- works as a Pharmacist, hospital   Enjoys spending time with her husband   Has a dog and will adopt another soon.    Social Determinants of Health   Financial Resource Strain: Not on file  Food Insecurity: Not on file  Transportation Needs: Not on file  Physical Activity: Not on file  Stress: Not on file  Social Connections: Not on file  Intimate Partner Violence: Not on file    Outpatient Medications Prior to Visit  Medication Sig Dispense Refill   omeprazole (PRILOSEC) 40 MG capsule Take 1 capsule (40 mg total) by mouth in the morning and  at bedtime. 180 capsule 1   DULoxetine (CYMBALTA) 30 MG capsule Take 2 capsules (60 mg total) by mouth daily. 90 capsule 0   mirabegron ER (MYRBETRIQ) 25 MG TB24 tablet Take 1 tablet (25 mg total) by mouth daily. 30 tablet 3   predniSONE (DELTASONE) 5 MG tablet Take 5 mg by mouth daily with breakfast.     No facility-administered medications prior to visit.    Allergies  Allergen Reactions   Tolmetin     Patient reports taking OTC ibuprofen and is not allergic     Review of Systems  Genitourinary:  Negative for frequency.      Objective:    Physical Exam Constitutional:      General: She is not in acute distress.    Appearance: Normal appearance. She is not ill-appearing.  HENT:     Head: Normocephalic and atraumatic.     Right Ear: External ear normal.     Left Ear: External ear normal.  Eyes:     Extraocular Movements: Extraocular movements intact.     Pupils: Pupils are equal, round, and reactive to light.  Cardiovascular:     Rate and Rhythm: Normal rate and regular rhythm.     Heart sounds: Normal heart sounds. No murmur heard.   No gallop.  Pulmonary:     Effort: Pulmonary effort is normal. No respiratory distress.     Breath sounds: Normal breath sounds. No wheezing or rales.  Skin:    General: Skin is warm and dry.  Neurological:     Mental Status: She is alert and oriented to person, place, and time.  Psychiatric:        Behavior: Behavior normal.    BP 112/77 (BP Location: Right Arm, Patient Position: Sitting, Cuff Size: Small)   Pulse 77   Temp 98 F (36.7 C) (Oral)   Resp 16   Wt 241 lb (109.3 kg)   SpO2 97%   BMI 40.10 kg/m  Wt Readings from Last 3 Encounters:  06/17/21 241 lb (109.3 kg)  05/03/21 230 lb (104.3 kg)  12/14/20 220 lb (99.8 kg)       Assessment & Plan:   Problem List Items Addressed This Visit       Unprioritized   Overactive bladder    Improved. No longer needing myrbetriq.  D/c myrbetriq, monitor.       Liver mass  She had MRI performed in [place] and is awaiting results from her provider there.       Impacted cerumen of left ear    New.  After verbal consent CMA flushed left ear.  Wax is removed by syringing. Pt tolerated procedure.       Anxiety and depression - Primary    Significantly improved. Will continue cymbalta at 60 mg once daily.       Relevant Medications   DULoxetine (CYMBALTA) 60 MG capsule     Meds ordered this encounter  Medications   DULoxetine (CYMBALTA) 60 MG capsule    Sig: Take 1 capsule (60 mg total) by mouth daily.    Dispense:  90 capsule    Refill:  1    Order Specific Question:   Supervising Provider    Answer:   Penni Homans A [[id number]]    I, [name], personally preformed the services described in this documentation.  All medical record entries made by the scribe were at my direction and in my presence.  I have reviewed the chart and discharge instructions (if applicable) and agree that the record reflects my personal performance and is accurate and complete. 06/17/2021   I,[name],acting as a scribe for Nance Pear, NP.,have documented all relevant documentation on the behalf of Nance Pear, NP,as directed by  Nance Pear, NP while in the presence of Nance Pear, NP.   Nance Pear, NP

## 2021-06-17 NOTE — Assessment & Plan Note (Signed)
Improved. No longer needing myrbetriq.  D/c myrbetriq, monitor.

## 2021-06-17 NOTE — Assessment & Plan Note (Signed)
Significantly improved. Will continue cymbalta at 60 mg once daily.

## 2021-06-17 NOTE — Assessment & Plan Note (Signed)
She had MRI performed in [place] and is awaiting results from her provider there.

## 2021-06-28 ENCOUNTER — Other Ambulatory Visit: Payer: Self-pay | Admitting: Family

## 2021-11-05 ENCOUNTER — Telehealth: Payer: 59 | Admitting: Family

## 2021-11-05 NOTE — Progress Notes (Signed)
Pt did not answer phone or connect to virtual video link.

## 2021-11-19 ENCOUNTER — Other Ambulatory Visit: Payer: Self-pay | Admitting: Family

## 2021-12-15 ENCOUNTER — Other Ambulatory Visit: Payer: Self-pay | Admitting: Family

## 2022-01-11 ENCOUNTER — Other Ambulatory Visit: Payer: Self-pay | Admitting: Family

## 2022-01-23 ENCOUNTER — Telehealth: Payer: Self-pay | Admitting: Family

## 2022-01-23 NOTE — Telephone Encounter (Signed)
Pt stated she has been feeling a bit congested. She is out of state so she went to an urgent care and they advised her she tested neg for covid, rsv, and strep, but her lymph nodes on the back of her neck were very swollen. The dr was very concerned and stated she will need to get an mri immediately. She stated he would be sending a request via fax for this to be done as soon as she gets back in town Saturday. Please advise.  ?

## 2022-01-23 NOTE — Telephone Encounter (Signed)
Patient was scheduled to see Dr. Etter Sjogren on monday ?

## 2022-01-27 ENCOUNTER — Ambulatory Visit: Payer: 59 | Admitting: Family Medicine

## 2022-01-27 ENCOUNTER — Encounter: Payer: Self-pay | Admitting: Family Medicine

## 2022-01-27 VITALS — BP 110/80 | HR 83 | Temp 97.9°F | Resp 18 | Ht 65.0 in | Wt 223.4 lb

## 2022-01-27 DIAGNOSIS — R591 Generalized enlarged lymph nodes: Secondary | ICD-10-CM

## 2022-01-27 NOTE — Progress Notes (Signed)
? ?Subjective:  ? ?By signing my name below, I, [name], attest that this documentation has been prepared under the direction and in the presence of Roma Schanz DO, 01/27/2022 ?   ? ? Patient ID: Melissa Santiago, female    DOB: [birth year], 56 y.o.   MRN: [id number] ? ?Chief Complaint  ?Patient presents with  ? Mass  ?  Pt states having enlarged lyphm nodes on each side of her neck. Pt states pain with touch. Pt reports no other sxs.   ? ? ?HPI ?Patient is in today for an office visit. ? ?Patient complains of an enlarged lymph node on the left side of her neck that appeared last week. She went to a physician on 01/23/2022 and was given Augmentin and Prednisone. She reports that the symptoms improved on 01/24/2022. She states that there is still a small enlargement on the left side of her neck that has been decreasing. She is continuing to take Prednisone and Augmentin.   [facility] her pcp because he was concerned about lymphoma but most of the lumps went away except one but it did shrink.   ? ?Past Medical History:  ?Diagnosis Date  ? Acute lumbar radiculopathy 06/29/2013  ? Chest pain in adult 05/24/2018  ? Chronic constipation 05/24/2018  ? Gastroesophageal reflux disease 05/24/2018  ? GERD (gastroesophageal reflux disease)   ? Left sided sciatica 05/10/2013  ? Lumbar disc herniation 06/29/2013  ? Morbid obesity ([place]) 05/24/2018  ? Obesity   ? Osteopenia 03/23/2015  ? S/P bariatric surgery 06/30/2013  ? ? ?Past Surgical History:  ?Procedure Laterality Date  ? BREAST BIOPSY Left 2014  ? LAPAROSCOPIC GASTRIC SLEEVE RESECTION  2014  ? [place] SURGERY  2014/2016  ? ? ?Family History  ?Problem Relation Age of Onset  ? Arthritis Mother   ? Early death Mother   ? Mental illness Mother   ? Alcohol abuse Father   ? Arthritis Father   ? Early death Sister   ? Thyroid cancer Sister   ? Hypertension Sister   ? Heart attack Maternal Grandfather   ? Uterine cancer Sister   ? Hypertension Sister   ? Cervical  cancer Sister   ? Depression Sister   ? Hypertension Sister   ? Colon cancer Neg Hx   ? Esophageal cancer Neg Hx   ? Breast cancer Neg Hx   ? Rectal cancer Neg Hx   ? Stomach cancer Neg Hx   ? ? ?Social History  ? ?Socioeconomic History  ? Marital status: Married  ?  Spouse name: [name]  ? Number of children: 2  ? Years of education: Not on file  ? Highest education level: Not on file  ?Occupational History  ? Occupation: Sales promotion account executive  ?Tobacco Use  ? Smoking status: Former  ?  Packs/day: 0.50  ?  Years: 15.00  ?  Pack years: 7.50  ?  Types: Cigarettes  ?  Quit date: 2006  ?  Years since quitting: 17.2  ? Smokeless tobacco: Never  ?Vaping Use  ? Vaping Use: Never used  ?Substance and Sexual Activity  ? Alcohol use: Yes  ?  Comment: rarely  ? Drug use: Never  ? Sexual activity: Yes  ?  Partners: Male  ?  Birth control/protection: I.U.D.  ?Other Topics Concern  ? Not on file  ?Social History Narrative  ? From [place], Divorced since 2015  ? Works as a Geophysical data processor for [employer]  ?  Remarried  ? 2 daughters- one in the [employer]- lives in [place], one in [place]- works as a Pharmacist, hospital  ? Enjoys spending time with her husband  ? Has a dog and will adopt another soon.   ? ?Social Determinants of Health  ? ?Financial Resource Strain: Not on file  ?Food Insecurity: Not on file  ?Transportation Needs: Not on file  ?Physical Activity: Not on file  ?Stress: Not on file  ?Social Connections: Not on file  ?Intimate Partner Violence: Not on file  ? ? ?Outpatient Medications Prior to Visit  ?Medication Sig Dispense Refill  ? DULoxetine (CYMBALTA) 60 MG capsule TAKE 1 CAPSULE BY MOUTH EVERY DAY 30 capsule 0  ? omeprazole (PRILOSEC) 40 MG capsule Take 1 capsule (40 mg total) by mouth in the morning and at bedtime. 180 capsule 1  ? ?No facility-administered medications prior to visit.  ? ? ?Allergies  ?Allergen Reactions  ? Tolmetin   ?  Patient reports taking OTC ibuprofen and is not allergic   ? ? ?Review of Systems   ?Constitutional:  Negative for fever and malaise/fatigue.  ?HENT:  Negative for congestion.   ?     (+) Small enlargement on the left side of her neck  ?Eyes:  Negative for blurred vision.  ?Respiratory:  Negative for shortness of breath.   ?Cardiovascular:  Negative for chest pain, palpitations and leg swelling.  ?Gastrointestinal:  Negative for abdominal pain, blood in stool and nausea.  ?Genitourinary:  Negative for dysuria and frequency.  ?Musculoskeletal:  Negative for falls.  ?Skin:  Negative for rash.  ?Neurological:  Negative for dizziness, loss of consciousness and headaches.  ?Endo/Heme/Allergies:  Negative for environmental allergies.  ?Psychiatric/Behavioral:  Negative for depression. The patient is not nervous/anxious.   ? ?   ?Objective:  ?  ?Physical Exam ?Vitals and nursing note reviewed.  ?Constitutional:   ?   General: She is not in acute distress. ?   Appearance: Normal appearance. She is well-developed. She is not ill-appearing.  ?HENT:  ?   Head: Normocephalic and atraumatic.  ?   Right Ear: External ear normal.  ?   Left Ear: External ear normal.  ?Eyes:  ?   Extraocular Movements: Extraocular movements intact.  ?   Conjunctiva/sclera: Conjunctivae normal.  ?   Pupils: Pupils are equal, round, and reactive to light.  ?Neck:  ?   Thyroid: No thyromegaly or thyroid tenderness.  ?   Vascular: No carotid bruit or JVD.  ?Cardiovascular:  ?   Rate and Rhythm: Normal rate and regular rhythm.  ?   Heart sounds: Normal heart sounds. No murmur heard. ?  No gallop.  ?Pulmonary:  ?   Effort: Pulmonary effort is normal. No respiratory distress.  ?   Breath sounds: Normal breath sounds. No wheezing or rales.  ?Chest:  ?   Chest wall: No tenderness.  ?Musculoskeletal:  ?   Cervical back: Normal range of motion and neck supple.  ?Lymphadenopathy:  ?   Cervical: Cervical adenopathy (Small enlargement on left posterior) present.  ?Skin: ?   General: Skin is warm and dry.  ?Neurological:  ?   Mental Status: She  is alert and oriented to person, place, and time.  ?Psychiatric:     ?   Judgment: Judgment normal.  ? ? ?BP 110/80 (BP Location: Left Arm, Patient Position: Sitting, Cuff Size: Large)   Pulse 83   Temp 97.9 ?F (36.6 ?C) (Oral)   Resp 18   Ht '5\' 5"'$  (  1.651 m)   Wt 223 lb 6.4 oz (101.3 kg)   SpO2 96%   BMI 37.18 kg/m?  ?Wt Readings from Last 3 Encounters:  ?01/27/22 223 lb 6.4 oz (101.3 kg)  ?06/17/21 241 lb (109.3 kg)  ?05/03/21 230 lb (104.3 kg)  ? ? ?Diabetic Foot Exam - Simple   ?No data filed ?  ? ?Lab Results  ?Component Value Date  ? WBC 10.2 01/04/2019  ? HGB 13.9 01/04/2019  ? HCT 43.2 01/04/2019  ? PLT 178 01/04/2019  ? GLUCOSE 88 12/14/2020  ? CHOL 229 (H) 12/14/2020  ? TRIG 69.0 12/14/2020  ? HDL 82.20 12/14/2020  ? LDLCALC 133 (H) 12/14/2020  ? ALT 19 12/14/2020  ? AST 19 12/14/2020  ? NA 140 12/14/2020  ? K 4.6 12/14/2020  ? CL 104 12/14/2020  ? CREATININE 0.64 12/14/2020  ? BUN 19 12/14/2020  ? CO2 31 12/14/2020  ? TSH 1.13 07/26/2018  ? ? ?Lab Results  ?Component Value Date  ? TSH 1.13 07/26/2018  ? ?Lab Results  ?Component Value Date  ? WBC 10.2 01/04/2019  ? HGB 13.9 01/04/2019  ? HCT 43.2 01/04/2019  ? MCV 84.7 01/04/2019  ? PLT 178 01/04/2019  ? ?Lab Results  ?Component Value Date  ? NA 140 12/14/2020  ? K 4.6 12/14/2020  ? CO2 31 12/14/2020  ? GLUCOSE 88 12/14/2020  ? BUN 19 12/14/2020  ? CREATININE 0.64 12/14/2020  ? BILITOT 0.4 12/14/2020  ? ALKPHOS 73 12/14/2020  ? AST 19 12/14/2020  ? ALT 19 12/14/2020  ? PROT 6.0 12/14/2020  ? ALBUMIN 3.9 12/14/2020  ? CALCIUM 9.0 12/14/2020  ? ANIONGAP 9 01/04/2019  ? GFR 101.36 12/14/2020  ? ?Lab Results  ?Component Value Date  ? CHOL 229 (H) 12/14/2020  ? ?Lab Results  ?Component Value Date  ? HDL 82.20 12/14/2020  ? ?Lab Results  ?Component Value Date  ? LDLCALC 133 (H) 12/14/2020  ? ?Lab Results  ?Component Value Date  ? TRIG 69.0 12/14/2020  ? ?Lab Results  ?Component Value Date  ? CHOLHDL 3 12/14/2020  ? ?No results found for: HGBA1C ? ?    ?Assessment & Plan:  ? ?Problem List Items Addressed This Visit   ? ?  ? Unprioritized  ? Lymphadenopathy - Primary  ?  Finish pred and abx ?Check labs ?If the node does not completely go away f/u pcp ?  ?  ? ? ? ? ?No orders of

## 2022-01-27 NOTE — Patient Instructions (Signed)
Lymphadenopathy ?Lymphadenopathy means that your lymph glands are swollen or larger than normal. Lymph glands, also called lymph nodes, are collections of tissue that filter excess fluid, bacteria, viruses, and waste from your bloodstream. They are part of your body's disease-fighting system (immune system), which protects your body from germs. ?There may be different causes of lymphadenopathy, depending on where it is in your body. Some types go away on their own. Lymphadenopathy can occur anywhere that you have lymph glands, including these areas: ?Neck (cervical lymphadenopathy). ?Chest (mediastinal lymphadenopathy). ?Lungs (hilar lymphadenopathy). ?Underarms (axillary lymphadenopathy). ?Groin (inguinal lymphadenopathy). ?When your immune system responds to germs, infection-fighting cells and fluid build up in your lymph glands. This causes some swelling and enlargement. If the lymph nodes do not go back to normal size after you have an infection or disease, your health care provider may do tests. These tests help to monitor your condition and find the reason why the glands are still swollen and enlarged. ?Follow these instructions at home: ? ?Get plenty of rest. ?Your health care provider may recommend over-the-counter medicines for pain. Take over-the-counter and prescription medicines only as told by your health care provider. ?If directed, apply heat to swollen lymph glands as often as told by your health care provider. Use the heat source that your health care provider recommends, such as a moist heat pack or a heating pad. ?Place a towel between your skin and the heat source. ?Leave the heat on for 20-30 minutes. ?Remove the heat if your skin turns bright red. This is especially important if you are unable to feel pain, heat, or cold. You may have a greater risk of getting burned. ?Check your affected lymph glands every day for changes. Check other lymph gland areas as told by your health care provider.  Check for changes such as: ?More swelling. ?Sudden increase in size. ?Redness or pain. ?Hardness. ?Keep all follow-up visits. This is important. ?Contact a health care provider if you have: ?Lymph glands that: ?Are still swollen after 2 weeks. ?Have suddenly gotten bigger or the swelling spreads. ?Are red, painful, or hard. ?Fluid leaking from the skin near an enlarged lymph gland. ?Problems with breathing. ?A fever, chills, or night sweats. ?Fatigue. ?A sore throat. ?Pain in your abdomen. ?Weight loss. ?Get help right away if you have: ?Severe pain. ?Chest pain. ?Shortness of breath. ?These symptoms may represent a serious problem that is an emergency. Do not wait to see if the symptoms will go away. Get medical help right away. Call your local emergency services (911 in the [place] Do not drive yourself to the hospital. ?Summary ?Lymphadenopathy means that your lymph glands are swollen or larger than normal. ?Lymph glands, also called lymph nodes, are collections of tissue that filter excess fluid, bacteria, viruses, and waste from the bloodstream. They are part of your body's disease-fighting system (immune system). ?Lymphadenopathy can occur anywhere that you have lymph glands. ?If the lymph nodes do not go back to normal size after you have an infection or disease, your health care provider may do tests to monitor your condition and find the reason why the glands are still swollen and enlarged. ?Check your affected lymph glands every day for changes. Check other lymph gland areas as told by your health care provider. ?This information is not intended to replace advice given to you by your health care provider. Make sure you discuss any questions you have with your health care provider. ?Document Revised: 08/08/2020 Document Reviewed: 08/08/2020 ?Elsevier Patient Education ? 2022  [employer]vier Inc[employer] ? ?

## 2022-01-27 NOTE — Assessment & Plan Note (Signed)
Finish pred and abx ?Check labs ?If the node does not completely go away f/u pcp ?

## 2022-02-04 ENCOUNTER — Ambulatory Visit: Payer: 59 | Admitting: Family

## 2022-02-16 ENCOUNTER — Other Ambulatory Visit: Payer: Self-pay | Admitting: Family

## 2022-03-10 ENCOUNTER — Ambulatory Visit: Payer: 59 | Admitting: Family

## 2022-03-10 VITALS — BP 108/59 | HR 82 | Temp 98.0°F | Resp 16

## 2022-03-10 DIAGNOSIS — H8110 Benign paroxysmal vertigo, unspecified ear: Secondary | ICD-10-CM

## 2022-03-10 DIAGNOSIS — H60501 Unspecified acute noninfective otitis externa, right ear: Secondary | ICD-10-CM | POA: Insufficient documentation

## 2022-03-10 MED ORDER — MECLIZINE HCL 25 MG PO TABS: 25.0000 mg | ORAL_TABLET | Freq: Three times a day (TID) | ORAL | 1 refills | Status: DC | PRN

## 2022-03-10 MED ORDER — NEOMYCIN-POLYMYXIN-HC 3.5-10000-1 OT SOLN: 3.0000 [drp] | Freq: Four times a day (QID) | OTIC | 0 refills | Status: DC

## 2022-03-10 NOTE — Assessment & Plan Note (Signed)
New. Rx with cortisporin Otic.  ?

## 2022-03-10 NOTE — Assessment & Plan Note (Signed)
Uncontrolled. Continue meclizine prn. Refer to PT for vestibular rehab.  Will request ED visit results including MRI which she states she had done in [place]. Did not do [employer] maneuver or Epley maneuver because she has been attempting at home and states "it is unbearable."  ?

## 2022-03-10 NOTE — Therapy (Signed)
?OUTPATIENT PHYSICAL THERAPY VESTIBULAR EVALUATION ? ? ? ? ?Patient Name: Melissa Santiago ?MRN: [id number] ?DOB:1966/01/21, 56 y.o., female ?Today's Date: 03/11/2022 ? ?PCP: [name], NP ?REFERRING PROVIDER: [name], NP ? ? PT End of Session - 03/11/22 0803   ? ? Visit Number 1   ? PT Start Time 0805   ? PT Stop Time 0850   ? PT Time Calculation (min) 45 min   ? ?  ?  ? ?  ? ? ?Past Medical History:  ?Diagnosis Date  ? Acute lumbar radiculopathy 06/29/2013  ? Chest pain in adult 05/24/2018  ? Chronic constipation 05/24/2018  ? Gastroesophageal reflux disease 05/24/2018  ? GERD (gastroesophageal reflux disease)   ? Left sided sciatica 05/10/2013  ? Lumbar disc herniation 06/29/2013  ? Morbid obesity ([place]) 05/24/2018  ? Obesity   ? Osteopenia 03/23/2015  ? S/P bariatric surgery 06/30/2013  ? ?Past Surgical History:  ?Procedure Laterality Date  ? BREAST BIOPSY Left 2014  ? LAPAROSCOPIC GASTRIC SLEEVE RESECTION  2014  ? [place] SURGERY  2014/2016  ? ?Patient Active Problem List  ? Diagnosis Date Noted  ? Benign paroxysmal positional vertigo 03/10/2022  ? Acute otitis externa of right ear 03/10/2022  ? Lymphadenopathy 01/27/2022  ? Erroneous encounter - disregard 11/05/2021  ? Overactive bladder 06/17/2021  ? Impacted cerumen of left ear 06/17/2021  ? Anxiety and depression 05/03/2021  ? Liver mass 05/03/2021  ? Chest pain in adult 05/24/2018  ? Morbid obesity ([place]) 05/24/2018  ? Gastroesophageal reflux disease 05/24/2018  ? Chronic constipation 05/24/2018  ? Osteopenia 03/23/2015  ? S/P bariatric surgery 06/30/2013  ? Lumbar disc herniation 06/29/2013  ? Acute lumbar radiculopathy 06/29/2013  ? Left sided sciatica 05/10/2013  ? ? ?ONSET DATE: January 2023 ? ?REFERRING DIAG: H81.10 (ICD-10-CM) - Benign paroxysmal positional vertigo, unspecified laterality ? ?THERAPY DIAG:  ?Dizziness and giddiness ? ?BPPV (benign paroxysmal positional vertigo), right ? ?SUBJECTIVE:  ? ?SUBJECTIVE STATEMENT: ?Pt. Was in [place]  at daughters, said she thought she was having a stroke, she rolled over in the middle of the night and it started, tried to get out bed and fell onto floor, called ambulance, it wasn't a stroke, gave her meds, a PT came in and did an Epley, she threw up, it was awful.  Took 2 weeks to recover.  Then a month ago started to get another earache.  Feels like it started with TMJ also.    Today it isn't so bad but day before yesterday couldn't get out of recliner.  ? ?Pt accompanied by: self ? ?PERTINENT HISTORY: From MD note on 03/09/2022: She complains of vertigo and right ear pain. She started experiencing vertigo since January, 2023. She reports feeling like there is something in her right ear. Her dizziness worsens when she has changes in her positions. She reports seeing an emergency room in [place] and was given meclizine and found relief after a week. She was given an MRI while in the emergency room and found no new issues. Her husband reports when her dizziness worsens she started experiencing nystagmus. She has seen an urgent care last week and was given meclizine and prednisone.  Unable to drive ? ? ?PAIN:  ?Are you having pain? Yes: NPRS scale: 3/10 ?Pain location: R ear ?Pain description: ache, constant ? ?PRECAUTIONS: Fall ? ?WEIGHT BEARING RESTRICTIONS No ? ?FALLS: Has patient fallen in last 6 months? Yes. Number of falls 10 falls from dizziness, fell down stairs, falling out of chair,  into wall ? ?LIVING ENVIRONMENT: ?Lives with: lives with their spouse ?Lives in: House/apartment ?Stairs: Yes: Internal: 15 steps; can reach both and External: 3 steps; can reach both ?Has following equipment at home: Single point cane ? ?PLOF: Independent  health care manager, remote ? ?PATIENT GOALS stop being dizzy.  ? ?OBJECTIVE:  ? ?DIAGNOSTIC FINDINGS: had MRI but not available for review ? ?COGNITION: ?Overall cognitive status: Within functional limits for tasks assessed ?  ?POSTURE: rounded shoulders, forward head,  decreased lumbar lordosis, and increased thoracic kyphosis ? ? ?Cervical ROM:   ? ?Active A/PROM (deg) ?03/11/2022  ?Flexion 65  ?Extension 50  ?Right lateral flexion   ?Left lateral flexion   ?Right rotation 80  ?Left rotation 80  ?(Blank rows = not tested) ? ?STRENGTH: 5/5 bil LE strength, 5/5 bil UE strength ? ? ?GAIT: ?Gait pattern: step through pattern ?Distance walked: 50 ?Assistive device utilized: None ?Level of assistance: Complete Independence ?Comments: noted occasional unsteadiness.  ? ?PATIENT SURVEYS:  ?DHI 84% = severe handicap ? ? ?VESTIBULAR ASSESSMENT ? ? GENERAL OBSERVATION: very slow guarded movements ?  ? SYMPTOM BEHAVIOR: ?  Subjective history: Pt. Reports dizziness "comes in waves" lasting several days, but actual spinning episodes are brief, lasting about a minute.  She reports multiple falls due to dizziness, especially at night getting up from bed.  "I can't live this way."  ?  Non-Vestibular symptoms: nausea/vomiting and ear pain ?  Type of dizziness: Imbalance (Disequilibrium) and Spinning/Vertigo ?  Frequency: almost daily, comes in waves   ?  Duration: 1 minute  ?  Aggravating factors: Induced by position change: lying supine, rolling to the left, and supine to sit and Induced by motion: occur when walking, looking up at the ceiling, bending down to the ground, turning body quickly, turning head quickly, driving, and sitting in a moving car ?  Relieving factors: medication and closing eyes ?  Progression of symptoms: unchanged ? ? OCULOMOTOR EXAM: ?  Ocular Alignment: normal ?  Ocular ROM: No Limitations ?  Spontaneous Nystagmus: absent ?  Gaze-Induced Nystagmus: absent ?  Smooth Pursuits: saccades ?  Saccades: extra eye movements ?  Convergence/Divergence: NT cm  ? ? VESTIBULAR - OCULAR REFLEX:  ?  Slow VOR: Comment: unable to perform today due to symptoms ?  VOR Cancellation: Comment: NA ?  Head-Impulse Test: HIT Right: NA ?  Dynamic Visual Acuity: Not able to be  assessed ?  ? POSITIONAL TESTING: Right Dix-Hallpike: noted nystagmus however patient had to return to sitting due to severe nausea; Duration:NA ?Right Roll Test: none; Duration: NA ?Left Roll Test: none; Duration: NA ?  ? ?MOTION SENSITIVITY: ? ?  Motion Sensitivity Quotient ? ?Intensity: 0 = none, 1 = Lightheaded, 2 = Mild, 3 = Moderate, 4 = Severe, 5 = Vomiting ? Intensity  ?1. Sitting to supine 4  ?2. Supine to L side   ?3. Supine to R side   ?4. Supine to sitting   ?5. L Hallpike-Dix   ?6. Up from L    ?7. R Hallpike-Dix 4  ?8. Up from R  4  ?9. Sitting, head  ?tipped to L knee   ?10. Head up from L  ?knee   ?11. Sitting, head  ?tipped to R knee   ?12. Head up from R  ?knee   ?13. Sitting head turns x5 4  ?14.Sitting head nods x5 4  ?15. In stance, 180?  ?turn to L    ?16. In stance,  180?  ?turn to R   ?  ?OTHOSTATICS: not done ? ?FUNCTIONAL GAIT:  not done today ? ? ?VESTIBULAR TREATMENT: ? ?Canalith Repositioning: ?  Epley Right: Number of Reps: 1, Response to Treatment: comment: increased dizziness, and Comment: was able to tolerate complete maneuver but increased dizziness following.  ? ?Gaze Adaptation: NA ?   ?Habituation: NA ?   ?Other: NA ? ?PATIENT EDUCATION: ?Education details: education on BPPV, anatomy, plan.  Recommended avoiding large changes in head position today, use walker at night when getting up for bathroom for safety.   ?Person educated: Patient ?Education method: Explanation ?Education comprehension: verbalized understanding ? ? ?GOALS: ?Goals reviewed with patient? Yes ? ?SHORT TERM GOALS: Target date: 03/25/2022   ?Patient will be assesses for balance deficits with goals TBD.  ?Baseline: ?Goal status: INITIAL ? ?2.  Patient will be independent with initial HEP.  ?Baseline:  none given ?Goal status: INITIAL ? ? ?LONG TERM GOALS: Target date: 04/22/2022   ?Patient will report 75% improvement in dizziness symptoms.   ?Baseline:  ?Goal status: INITIAL ? ?2.  Patient will demonstrate improved  balance with goal TBD.  ?Baseline:  ?Goal status: INITIAL ? ?3.  Patient will report decreased impairment due to dizziness by scoring less than 52% on DHI. ?Baseline: 84% - severe impairment ?Goal status: INITIAL ? ?4.  Pt. Will

## 2022-03-10 NOTE — Progress Notes (Signed)
? ?Subjective:  ? ?By signing my name below, I, Melissa Santiago, attest that this documentation has been prepared under the direction and in the presence of [name]. 03/10/2022 ? ? ? Patient ID: Melissa Santiago, female    DOB: 25-Feb-1966, 56 y.o.   MRN: [id number] ? ?Chief Complaint  ?Patient presents with  ? Dizziness  ?  Here for vertigo, feeling dizzy and nauseous   ? Ear Pain  ?  Complains of right ear pain  ? ? ?Dizziness ? ?Patient is in today for a office visit.  ? ?She complains of vertigo and right ear pain. She started experiencing vertigo since January, 2023. She reports feeling like there is something in her right ear. Her dizziness worsens when she has changes in her positions. She reports seeing an emergency room in [place] and was given meclizine and found relief after a week. She was given an MRI while in the emergency room and found no new issues. Her husband reports when her dizziness worsens she started experiencing nystagmus. She has seen an urgent care last week and was given meclizine and prednisone.  ? ? ?Health Maintenance Due  ?Topic Date Due  ? HIV Screening  Never done  ? Hepatitis C Screening  Never done  ? Zoster Vaccines- Shingrix (1 of 2) Never done  ? COVID-19 Vaccine (4 - Booster for Moderna series) 10/29/2020  ? ? ?Past Medical History:  ?Diagnosis Date  ? Acute lumbar radiculopathy 06/29/2013  ? Chest pain in adult 05/24/2018  ? Chronic constipation 05/24/2018  ? Gastroesophageal reflux disease 05/24/2018  ? GERD (gastroesophageal reflux disease)   ? Left sided sciatica 05/10/2013  ? Lumbar disc herniation 06/29/2013  ? Morbid obesity ([place]) 05/24/2018  ? Obesity   ? Osteopenia 03/23/2015  ? S/P bariatric surgery 06/30/2013  ? ? ?Past Surgical History:  ?Procedure Laterality Date  ? BREAST BIOPSY Left 2014  ? LAPAROSCOPIC GASTRIC SLEEVE RESECTION  2014  ? [place] SURGERY  2014/2016  ? ? ?Family History  ?Problem Relation Age of Onset  ? Arthritis Mother   ? Early death Mother   ?  Mental illness Mother   ? Alcohol abuse Father   ? Arthritis Father   ? Early death Sister   ? Thyroid cancer Sister   ? Hypertension Sister   ? Heart attack Maternal Grandfather   ? Uterine cancer Sister   ? Hypertension Sister   ? Cervical cancer Sister   ? Depression Sister   ? Hypertension Sister   ? Colon cancer Neg Hx   ? Esophageal cancer Neg Hx   ? Breast cancer Neg Hx   ? Rectal cancer Neg Hx   ? Stomach cancer Neg Hx   ? ? ?Social History  ? ?Socioeconomic History  ? Marital status: Married  ?  Spouse name: [name]  ? Number of children: 2  ? Years of education: Not on file  ? Highest education level: Not on file  ?Occupational History  ? Occupation: Sales promotion account executive  ?Tobacco Use  ? Smoking status: Former  ?  Packs/day: 0.50  ?  Years: 15.00  ?  Pack years: 7.50  ?  Types: Cigarettes  ?  Quit date: 2006  ?  Years since quitting: 17.3  ? Smokeless tobacco: Never  ?Vaping Use  ? Vaping Use: Never used  ?Substance and Sexual Activity  ? Alcohol use: Yes  ?  Comment: rarely  ? Drug use: Never  ? Sexual activity: Yes  ?  Partners: Male  ?  Birth control/protection: I.U.D.  ?Other Topics Concern  ? Not on file  ?Social History Narrative  ? From [place], Divorced since 2015  ? Works as a Geophysical data processor for [employer]  ? 2 daughters- one in the [employer]- lives in [place], one in [place]- works as a Pharmacist, hospital  ? Enjoys spending time with her husband  ? Has a dog and will adopt another soon.   ? ?Social Determinants of Health  ? ?Financial Resource Strain: Not on file  ?Food Insecurity: Not on file  ?Transportation Needs: Not on file  ?Physical Activity: Not on file  ?Stress: Not on file  ?Social Connections: Not on file  ?Intimate Partner Violence: Not on file  ? ? ?Outpatient Medications Prior to Visit  ?Medication Sig Dispense Refill  ? DULoxetine (CYMBALTA) 60 MG capsule TAKE 1 CAPSULE BY MOUTH EVERY DAY 90 capsule 1  ? omeprazole (PRILOSEC) 40 MG capsule Take 1 capsule (40 mg total) by mouth in the  morning and at bedtime. 180 capsule 1  ? ?No facility-administered medications prior to visit.  ? ? ?Allergies  ?Allergen Reactions  ? Tolmetin   ?  Patient reports taking OTC ibuprofen and is not allergic   ? ? ?Review of Systems  ?HENT:  Positive for ear pain (right ear).   ?Neurological:  Positive for dizziness.  ? ?   ?Objective:  ?  ?Physical Exam ?Constitutional:   ?   General: She is not in acute distress. ?   Appearance: Normal appearance. She is not ill-appearing.  ?HENT:  ?   Head: Normocephalic and atraumatic.  ?   Right Ear: Tympanic membrane normal.  ?   Left Ear: Tympanic membrane, ear canal and external ear normal.  ?   Ears:  ?   Comments: Otitis externa in right ear ?Eyes:  ?   Extraocular Movements: Extraocular movements intact.  ?   Pupils: Pupils are equal, round, and reactive to light.  ?Cardiovascular:  ?   Rate and Rhythm: Normal rate and regular rhythm.  ?   Heart sounds: Normal heart sounds. No murmur heard. ?  No gallop.  ?Pulmonary:  ?   Effort: Pulmonary effort is normal. No respiratory distress.  ?   Breath sounds: Normal breath sounds. No wheezing or rales.  ?Skin: ?   General: Skin is warm and dry.  ?Neurological:  ?   General: No focal deficit present.  ?   Mental Status: She is alert and oriented to person, place, and time.  ?   Comments: + facial symmetry, speech is clear  ?Psychiatric:     ?   Judgment: Judgment normal.  ? ? ?BP (!) 108/59 (BP Location: Right Arm, Patient Position: Sitting, Cuff Size: Small)   Pulse 82   Temp 98 ?F (36.7 ?C) (Oral)   Resp 16   SpO2 97%  ?Wt Readings from Last 3 Encounters:  ?01/27/22 223 lb 6.4 oz (101.3 kg)  ?06/17/21 241 lb (109.3 kg)  ?05/03/21 230 lb (104.3 kg)  ? ? ?   ?Assessment & Plan:  ? ?Problem List Items Addressed This Visit   ? ?  ? Unprioritized  ? Benign paroxysmal positional vertigo - Primary  ?  Uncontrolled. Continue meclizine prn. Refer to PT for vestibular rehab.  Will request ED visit results including MRI which she states  she had done in [place]. Did not do [employer] maneuver or Epley maneuver because she has been attempting  at home and states "it is unbearable."  ? ?  ?  ? Relevant Medications  ? meclizine (ANTIVERT) 25 MG tablet  ? neomycin-polymyxin-hydrocortisone (CORTISPORIN) OTIC solution  ? Other Relevant Orders  ? Ambulatory referral to Physical Therapy  ? Acute otitis externa of right ear  ?  New. Rx with cortisporin Otic.  ? ?  ?  ? ? ? ?Meds ordered this encounter  ?Medications  ? meclizine (ANTIVERT) 25 MG tablet  ?  Sig: Take 1 tablet (25 mg total) by mouth 3 (three) times daily as needed for dizziness.  ?  Dispense:  30 tablet  ?  Refill:  1  ?  Order Specific Question:   Supervising Provider  ?  Answer:   Penni Homans A [[id number]]  ? neomycin-polymyxin-hydrocortisone (CORTISPORIN) OTIC solution  ?  Sig: Place 3 drops into the right ear 4 (four) times daily.  ?  Dispense:  10 mL  ?  Refill:  0  ?  Order Specific Question:   Supervising Provider  ?  Answer:   Penni Homans A [[id number]]  ? ? ?I, Nance Pear, NP, personally preformed the services described in this documentation.  All medical record entries made by the scribe were at my direction and in my presence.  I have reviewed the chart and discharge instructions (if applicable) and agree that the record reflects my personal performance and is accurate and complete. 03/10/2022 ? ? ?Engineering geologist as a Education administrator for [employer], NP.,have documented all relevant documentation on the behalf of Nance Pear, NP,as directed by  Nance Pear, NP while in the presence of Nance Pear, NP. ? ? ?Nance Pear, NP ? ?

## 2022-03-11 ENCOUNTER — Encounter: Payer: Self-pay | Admitting: Physical Therapy

## 2022-03-11 ENCOUNTER — Ambulatory Visit: Payer: 59 | Attending: Family | Admitting: Physical Therapy

## 2022-03-11 DIAGNOSIS — R42 Dizziness and giddiness: Secondary | ICD-10-CM | POA: Diagnosis present

## 2022-03-11 DIAGNOSIS — H8111 Benign paroxysmal vertigo, right ear: Secondary | ICD-10-CM | POA: Insufficient documentation

## 2022-03-11 DIAGNOSIS — H8110 Benign paroxysmal vertigo, unspecified ear: Secondary | ICD-10-CM | POA: Insufficient documentation

## 2022-03-12 ENCOUNTER — Encounter: Payer: Self-pay | Admitting: Family

## 2022-03-12 DIAGNOSIS — R42 Dizziness and giddiness: Secondary | ICD-10-CM

## 2022-03-13 NOTE — Therapy (Signed)
OUTPATIENT PHYSICAL THERAPY VESTIBULAR TREATMENT     Patient Name: Melissa Santiago MRN: [id number] DOB:[birth year], 56 y.o., female Today's Date: 03/14/2022  PCP: [name], NP REFERRING PROVIDER: [name], NP   PT End of Session - 03/14/22 1208     Visit Number 2    Number of Visits 13    Date for PT Re-Evaluation 04/22/22    Authorization Type [employer]    PT Start Time 0850    PT Stop Time 0934    PT Time Calculation (min) 44 min    Activity Tolerance Other (comment)   dizziness, nausea, imbalance   Behavior During Therapy Anxious              Past Medical History:  Diagnosis Date   Acute lumbar radiculopathy 06/29/2013   Chest pain in adult 05/24/2018   Chronic constipation 05/24/2018   Gastroesophageal reflux disease 05/24/2018   GERD (gastroesophageal reflux disease)    Left sided sciatica 05/10/2013   Lumbar disc herniation 06/29/2013   Morbid obesity ([place]) 05/24/2018   Obesity    Osteopenia 03/23/2015   S/P bariatric surgery 06/30/2013   Past Surgical History:  Procedure Laterality Date   BREAST BIOPSY Left 2014   [place] RESECTION  2014   LUMBAR [place] SURGERY  2014/2016   Patient Active Problem List   Diagnosis Date Noted   Benign paroxysmal positional vertigo 03/10/2022   Acute otitis externa of right ear 03/10/2022   Lymphadenopathy 01/27/2022   Erroneous encounter - disregard 11/05/2021   Overactive bladder 06/17/2021   Impacted cerumen of left ear 06/17/2021   Anxiety and depression 05/03/2021   Liver mass 05/03/2021   Chest pain in adult 05/24/2018   Morbid obesity ([place]) 05/24/2018   Gastroesophageal reflux disease 05/24/2018   Chronic constipation 05/24/2018   Osteopenia 03/23/2015   S/P bariatric surgery 06/30/2013   Lumbar disc herniation 06/29/2013   Acute lumbar radiculopathy 06/29/2013   Left sided sciatica 05/10/2013    ONSET DATE: January 2023  REFERRING DIAG: H81.10 (ICD-10-CM) - Benign paroxysmal  positional vertigo, unspecified laterality  THERAPY DIAG:  Dizziness and giddiness  BPPV (benign paroxysmal positional vertigo), right  SUBJECTIVE:   SUBJECTIVE STATEMENT: Reports that she had an Epley done at eval and it "wrecked me for the rest of the day." "Don't feel too bad today, just feel like I've been drinking wine." Reports that she has had several falls- last one occurred while walking yesterday. Denies head trauma but reports bruising her R arm. Reports that she just got an ENT referral but does not have neurologist. Continues to have ear aches. Reports that she did have a brain MRI which looked clear- PCP is trying to get a copy of this as it was done in [place].  Around the time of 1st onset- she and her family had a stomach bug and cough which progressed to earache. Reports vision doesn't appear as "crisp," report of decreased hearing in the R ear, some mild tinnitus. Reports remote hx of migraines as a child. She experienced head trauma from a fall in January. Denies photo/phonophobia.   Pt accompanied by: self  PERTINENT HISTORY: From MD note on 03/09/2022: She complains of vertigo and right ear pain. She started experiencing vertigo since January, 2023. She reports feeling like there is something in her right ear. Her dizziness worsens when she has changes in her positions. She reports seeing an emergency room in [place] and was given meclizine and found relief after a week. She  was given an MRI while in the emergency room and found no new issues. Her husband reports when her dizziness worsens she started experiencing nystagmus. She has seen an urgent care last week and was given meclizine and prednisone.  Unable to drive   PAIN:  Are you having pain? Yes: NPRS scale: 0/10 Pain location:   Pain description:    PRECAUTIONS: Fall   PATIENT GOALS stop being dizzy.     TODAY'S TREATMENT: 03/14/22  OCULOMOTOR EXAM: *wearing bifocals and reports "feels that I have to wear them"  d/t dizziness *listing to the L with exam and head appears to sway   Ocular Alignment: normal   Ocular ROM: normal   Spontaneous Nystagmus: normal   Gaze-Induced Nystagmus: normal   Smooth Pursuits: a couple saccades inferior direction   Saccades: normal   Convergence/Divergence: ~16 inches before c/o diplopia   VESTIBULAR - OCULAR REFLEX:    Slow VOR: slow and c/o significant dizziness horizontal; slow but asymptomatic with head nods   VOR Cancellation: intact but causing significant dizziness and patient listing heavily to L and nearly falling out of seat   Head-Impulse Test: positive on R; negative L      POSITIONAL TESTING:  Right Roll Test: negative Left Roll Test: c/o ~20 dizziness but no nystagmus Right Dix-Hallpike: negative Left Dix-Hallpike: negative       OBJECTIVE: measures were taken at time of initial evaluation unless otherwise specified   DIAGNOSTIC FINDINGS: had MRI but not available for review  COGNITION: Overall cognitive status: Within functional limits for tasks assessed   POSTURE: rounded shoulders, forward head, decreased lumbar lordosis, and increased thoracic kyphosis   Cervical ROM:    Active A/PROM (deg) 03/11/2022  Flexion 65  Extension 50  Right lateral flexion   Left lateral flexion   Right rotation 80  Left rotation 80  (Blank rows = not tested)  STRENGTH: 5/5 bil LE strength, 5/5 bil UE strength   GAIT: Gait pattern: step through pattern Distance walked: 50 Assistive device utilized: None Level of assistance: Complete Independence Comments: noted occasional unsteadiness.   PATIENT SURVEYS:  DHI 84% = severe handicap   VESTIBULAR ASSESSMENT   GENERAL OBSERVATION: very slow guarded movements    SYMPTOM BEHAVIOR:   Subjective history: Pt. Reports dizziness "comes in waves" lasting several days, but actual spinning episodes are brief, lasting about a minute.  She reports multiple falls due to dizziness, especially at night  getting up from bed.  "I can't live this way."    Non-Vestibular symptoms: nausea/vomiting and ear pain   Type of dizziness: Imbalance (Disequilibrium) and Spinning/Vertigo   Frequency: almost daily, comes in waves     Duration: 1 minute    Aggravating factors: Induced by position change: lying supine, rolling to the left, and supine to sit and Induced by motion: occur when walking, looking up at the ceiling, bending down to the ground, turning body quickly, turning head quickly, driving, and sitting in a moving car   Relieving factors: medication and closing eyes   Progression of symptoms: unchanged   OCULOMOTOR EXAM:   Ocular Alignment: normal   Ocular ROM: No Limitations   Spontaneous Nystagmus: absent   Gaze-Induced Nystagmus: absent   Smooth Pursuits: saccades   Saccades: extra eye movements   Convergence/Divergence: NT cm    VESTIBULAR - OCULAR REFLEX:    Slow VOR: Comment: unable to perform today due to symptoms   VOR Cancellation: Comment: NA   Head-Impulse Test: HIT Right: NA  Dynamic Visual Acuity: Not able to be assessed    POSITIONAL TESTING: Right Dix-Hallpike: noted nystagmus however patient had to return to sitting due to severe nausea; Duration:NA Right Roll Test: none; Duration: NA Left Roll Test: none; Duration: NA    MOTION SENSITIVITY:    Motion Sensitivity Quotient  Intensity: 0 = none, 1 = Lightheaded, 2 = Mild, 3 = Moderate, 4 = Severe, 5 = Vomiting  Intensity  1. Sitting to supine 4  2. Supine to L side   3. Supine to R side   4. Supine to sitting   5. L Hallpike-Dix   6. Up from L    7. R Hallpike-Dix 4  8. Up from R  4  9. Sitting, head  tipped to L knee   10. Head up from L  knee   11. Sitting, head  tipped to R knee   12. Head up from R  knee   13. Sitting head turns x5 4  14.Sitting head nods x5 4  15. In stance, 180  turn to L    16. In stance, 180  turn to R     OTHOSTATICS: not done  FUNCTIONAL GAIT:  not done  today   VESTIBULAR TREATMENT:  Canalith Repositioning:   Epley Right: Number of Reps: 1, Response to Treatment: comment: increased dizziness, and Comment: was able to tolerate complete maneuver but increased dizziness following.   Gaze Adaptation: NA    Habituation: NA    Other: NA  PATIENT EDUCATION: Education details: education on BPPV, anatomy, plan.  Recommended avoiding large changes in head position today, use walker at night when getting up for bathroom for safety.   Person educated: Patient Education method: Explanation Education comprehension: verbalized understanding   GOALS: Goals reviewed with patient? Yes  SHORT TERM GOALS: Target date: 03/25/2022    1.  Patient will be independent with initial HEP.  Baseline:  none given Goal status: IN PROGRESS   LONG TERM GOALS: Target date: 04/22/2022   Patient will report 75% improvement in dizziness symptoms.   Baseline:  Goal status: IN PROGRESS  2.  Patient to demonstrate 18/24 on DGI, indicating decreased risk of falls.  Baseline:  Goal status: IN PROGRESS  3.  Patient will report decreased impairment due to dizziness by scoring less than 52% on DHI. Baseline: 84% - severe impairment Goal status: IN PROGRESS  4.  Pt. Will be independent with progressed HEP to improve symptoms and carryover. Baseline:  Goal status: IN PROGRESS  5.  Patient will be able to return to driving safely without limitation from dizziness.  Baseline: unable to drive  Goal status: IN PROGRESS  ASSESSMENT:  CLINICAL IMPRESSION: Patient presented as transfer from [facility] clinic for continued exam/treatment of vertigo. Patient very off balance with ambulation and sitting during exam, revealing listing to the L and requiring PT to encourage patient to re-center herself to avoid fall. Oculomotor exam revealed inferior saccades with smooth pursuit, convergence insufficiency, significant dizziness and imbalance with VOR, VOR cancellation, and  positive R HIT. Positional testing was negative. Patient recalls a stomach bug and a fall resulting in head trauma around the time of 1st onset of vertigo. This hx in addition to patient's exam suggesting possible vestibular hypofunction + concussion.  Provided walker for safety, edu on HEP and vestibular hypofunction. Patient reported understanding and able to safely ambulate with device to the waiting room.    OBJECTIVE IMPAIRMENTS Abnormal gait, decreased activity tolerance, decreased balance, decreased mobility,  difficulty walking, dizziness, and postural dysfunction.   ACTIVITY LIMITATIONS cleaning, community activity, driving, meal prep, occupation, laundry, yard work, and shopping.   PERSONAL FACTORS Past/current experiences, Time since onset of injury/illness/exacerbation, and 1 comorbidity: history of back pain and surgery  are also affecting patient's functional outcome.    REHAB POTENTIAL: Good  CLINICAL DECISION MAKING: Stable/uncomplicated  EVALUATION COMPLEXITY: Low   PLAN: PT FREQUENCY: 1-2x/week  PT DURATION: 6 weeks  PLANNED INTERVENTIONS: Therapeutic exercises, Therapeutic activity, Neuromuscular re-education, Balance training, Gait training, Patient/Family education, Joint mobilization, Vestibular training, Canalith repositioning, Manual therapy, and Re-evaluation  PLAN FOR NEXT SESSION: reassess BPPV, assess balance and goals.    Janene Harvey, PT, DPT 03/14/22 12:19 PM

## 2022-03-14 ENCOUNTER — Encounter: Payer: Self-pay | Admitting: Physical Therapy

## 2022-03-14 ENCOUNTER — Ambulatory Visit: Payer: 59 | Admitting: Physical Therapy

## 2022-03-14 DIAGNOSIS — R42 Dizziness and giddiness: Secondary | ICD-10-CM

## 2022-03-14 DIAGNOSIS — H8111 Benign paroxysmal vertigo, right ear: Secondary | ICD-10-CM

## 2022-03-17 NOTE — Therapy (Signed)
OUTPATIENT PHYSICAL THERAPY VESTIBULAR TREATMENT     Patient Name: Melissa Santiago MRN: [id number] DOB:1966-03-30, 56 y.o., female Today's Date: 03/18/2022  PCP: [name], NP REFERRING PROVIDER: [name], NP   PT End of Session - 03/18/22 1018     Visit Number 3    Number of Visits 13    Date for PT Re-Evaluation 04/22/22    Authorization Type [employer]    PT Start Time 0932    PT Stop Time 1012    PT Time Calculation (min) 40 min    Equipment Utilized During Treatment Gait belt    Activity Tolerance Other (comment)   dizziness, nausea, imbalance   Behavior During Therapy Anxious               Past Medical History:  Diagnosis Date   Acute lumbar radiculopathy 06/29/2013   Chest pain in adult 05/24/2018   Chronic constipation 05/24/2018   Gastroesophageal reflux disease 05/24/2018   GERD (gastroesophageal reflux disease)    Left sided sciatica 05/10/2013   Lumbar disc herniation 06/29/2013   Morbid obesity ([place]) 05/24/2018   Obesity    Osteopenia 03/23/2015   S/P bariatric surgery 06/30/2013   Past Surgical History:  Procedure Laterality Date   BREAST BIOPSY Left 2014   [place] RESECTION  2014   LUMBAR [place] SURGERY  2014/2016   Patient Active Problem List   Diagnosis Date Noted   Benign paroxysmal positional vertigo 03/10/2022   Acute otitis externa of right ear 03/10/2022   Lymphadenopathy 01/27/2022   Erroneous encounter - disregard 11/05/2021   Overactive bladder 06/17/2021   Impacted cerumen of left ear 06/17/2021   Anxiety and depression 05/03/2021   Liver mass 05/03/2021   Chest pain in adult 05/24/2018   Morbid obesity ([place]) 05/24/2018   Gastroesophageal reflux disease 05/24/2018   Chronic constipation 05/24/2018   Osteopenia 03/23/2015   S/P bariatric surgery 06/30/2013   Lumbar disc herniation 06/29/2013   Acute lumbar radiculopathy 06/29/2013   Left sided sciatica 05/10/2013    ONSET DATE: January  2023  REFERRING DIAG: H81.10 (ICD-10-CM) - Benign paroxysmal positional vertigo, unspecified laterality  THERAPY DIAG:  Dizziness and giddiness  BPPV (benign paroxysmal positional vertigo), right  SUBJECTIVE:   SUBJECTIVE STATEMENT: Had a dentist appointment yesterday and "it was a nightmare getting up from the dentist's chair" d/t spinning. Still having that same sensation now, but milder. HEP is going well.   Pt accompanied by: self  PERTINENT HISTORY: From MD note on 03/09/2022: She complains of vertigo and right ear pain. She started experiencing vertigo since January, 2023. She reports feeling like there is something in her right ear. Her dizziness worsens when she has changes in her positions. She reports seeing an emergency room in [place] and was given meclizine and found relief after a week. She was given an MRI while in the emergency room and found no new issues. Her husband reports when her dizziness worsens she started experiencing nystagmus. She has seen an urgent care last week and was given meclizine and prednisone.  Unable to drive   PAIN:  Are you having pain? Yes: NPRS scale: 2/10 Pain location: R ear Pain description: ache Aggravating factors: none Relieving factors: none  PRECAUTIONS: Fall   PATIENT GOALS stop being dizzy.     TODAY'S TREATMENT: 03/18/22 Activity Comments  R/L roll test negative negative  L sidelying test Negative but significant dizziness and LOB with L pulsion upon sitting up, requiring chair to stabilize  R sidelying test Negative but significant dizziness and LOB with L pulsion upon sitting up, requiring chair to stabilize; slightly less severe on this side  R/L elbow prop + gaze stabilization 2x3 each side C/o 2/10 dizziness; cues to allow symptoms to settle after each rep  STS with hands on knees and chair in front 2x5 CGA; posterior sway  Unsupported sitting with EC 30" Good stability   Unsupported sitting with head turns and nods EC  30" each Imbalance and dizziness when stopping, requiring chair support    PATIENT EDUCATION: Education details: HEP; edu on benefits of RW and reacher for safety Person educated: Patient Education method: Explanation, Demonstration, Tactile cues, Verbal cues, and Handouts Education comprehension: verbalized understanding and returned demonstration   Access Code: [email or web address] URL: [email or web address] Date: 03/18/2022 Prepared by: [facility]  Program Notes  make sure you have family and a chair nearby for safety  Exercises - Seated Head Nods Vestibular Habituation (EO and EC)  - 1 x daily - 5 x weekly - 2-3 sets - 30 sec hold - Seated Gaze Stabilization with Head Rotation (EO and EC)  - 1 x daily - 5 x weekly - 2-3 sets - 30 sec hold - Sit to Stand with Counter Support  - 1 x daily - 5 x weekly - 2 sets - 5 reps - Sidebending to Elbow Short Sit  - 1 x daily - 5 x weekly - 2 sets - 3-5 reps  TREATMENT: 03/14/22  OCULOMOTOR EXAM: *wearing bifocals and reports "feels that I have to wear them" d/t dizziness *listing to the L with exam and head appears to sway   Ocular Alignment: normal   Ocular ROM: normal   Spontaneous Nystagmus: normal   Gaze-Induced Nystagmus: normal   Smooth Pursuits: a couple saccades inferior direction   Saccades: normal   Convergence/Divergence: ~16 inches before c/o diplopia   VESTIBULAR - OCULAR REFLEX:    Slow VOR: slow and c/o significant dizziness horizontal; slow but asymptomatic with head nods   VOR Cancellation: intact but causing significant dizziness and patient listing heavily to L and nearly falling out of seat   Head-Impulse Test: positive on R; negative L      POSITIONAL TESTING:  Right Roll Test: negative Left Roll Test: c/o ~20 dizziness but no nystagmus Right Dix-Hallpike: negative Left Dix-Hallpike: negative       OBJECTIVE: measures were taken at time of initial evaluation  unless otherwise specified   DIAGNOSTIC FINDINGS: had MRI but not available for review  COGNITION: Overall cognitive status: Within functional limits for tasks assessed   POSTURE: rounded shoulders, forward head, decreased lumbar lordosis, and increased thoracic kyphosis   Cervical ROM:    Active A/PROM (deg) 03/11/2022  Flexion 65  Extension 50  Right lateral flexion   Left lateral flexion   Right rotation 80  Left rotation 80  (Blank rows = not tested)  STRENGTH: 5/5 bil LE strength, 5/5 bil UE strength   GAIT: Gait pattern: step through pattern Distance walked: 50 Assistive device utilized: None Level of assistance: Complete Independence Comments: noted occasional unsteadiness.   PATIENT SURVEYS:  DHI 84% = severe handicap   VESTIBULAR ASSESSMENT   GENERAL OBSERVATION: very slow guarded movements    SYMPTOM BEHAVIOR:   Subjective history: Pt. Reports dizziness "comes in waves" lasting several days, but actual spinning episodes are brief, lasting about a minute.  She reports multiple falls due to dizziness, especially at night  getting up from bed.  "I can't live this way."    Non-Vestibular symptoms: nausea/vomiting and ear pain   Type of dizziness: Imbalance (Disequilibrium) and Spinning/Vertigo   Frequency: almost daily, comes in waves     Duration: 1 minute    Aggravating factors: Induced by position change: lying supine, rolling to the left, and supine to sit and Induced by motion: occur when walking, looking up at the ceiling, bending down to the ground, turning body quickly, turning head quickly, driving, and sitting in a moving car   Relieving factors: medication and closing eyes   Progression of symptoms: unchanged   OCULOMOTOR EXAM:   Ocular Alignment: normal   Ocular ROM: No Limitations   Spontaneous Nystagmus: absent   Gaze-Induced Nystagmus: absent   Smooth Pursuits: saccades   Saccades: extra eye movements   Convergence/Divergence: NT cm     VESTIBULAR - OCULAR REFLEX:    Slow VOR: Comment: unable to perform today due to symptoms   VOR Cancellation: Comment: NA   Head-Impulse Test: HIT Right: NA   Dynamic Visual Acuity: Not able to be assessed    POSITIONAL TESTING: Right Dix-Hallpike: noted nystagmus however patient had to return to sitting due to severe nausea; Duration:NA Right Roll Test: none; Duration: NA Left Roll Test: none; Duration: NA    MOTION SENSITIVITY:    Motion Sensitivity Quotient  Intensity: 0 = none, 1 = Lightheaded, 2 = Mild, 3 = Moderate, 4 = Severe, 5 = Vomiting  Intensity  1. Sitting to supine 4  2. Supine to L side   3. Supine to R side   4. Supine to sitting   5. L Hallpike-Dix   6. Up from L    7. R Hallpike-Dix 4  8. Up from R  4  9. Sitting, head  tipped to L knee   10. Head up from L  knee   11. Sitting, head  tipped to R knee   12. Head up from R  knee   13. Sitting head turns x5 4  14.Sitting head nods x5 4  15. In stance, 180  turn to L    16. In stance, 180  turn to R     OTHOSTATICS: not done  FUNCTIONAL GAIT:  not done today   VESTIBULAR TREATMENT:  Canalith Repositioning:   Epley Right: Number of Reps: 1, Response to Treatment: comment: increased dizziness, and Comment: was able to tolerate complete maneuver but increased dizziness following.   Gaze Adaptation: NA    Habituation: NA    Other: NA  PATIENT EDUCATION: Education details: education on BPPV, anatomy, plan.  Recommended avoiding large changes in head position today, use walker at night when getting up for bathroom for safety.   Person educated: Patient Education method: Explanation Education comprehension: verbalized understanding   GOALS: Goals reviewed with patient? Yes  SHORT TERM GOALS: Target date: 03/25/2022    1.  Patient will be independent with initial HEP.  Baseline:  none given Goal status: IN PROGRESS   LONG TERM GOALS: Target date: 04/22/2022   Patient will report 75%  improvement in dizziness symptoms.   Baseline:  Goal status: IN PROGRESS  2.  Patient to demonstrate 18/24 on DGI, indicating decreased risk of falls.  Baseline:  Goal status: IN PROGRESS  3.  Patient will report decreased impairment due to dizziness by scoring less than 52% on DHI. Baseline: 84% - severe impairment Goal status: IN PROGRESS  4.  Pt. Will be independent  with progressed HEP to improve symptoms and carryover. Baseline:  Goal status: IN PROGRESS  5.  Patient will be able to return to driving safely without limitation from dizziness.  Baseline: unable to drive  Goal status: IN PROGRESS  ASSESSMENT:  CLINICAL IMPRESSION: Patient arrived to session with report of increase in dizziness since yesterday when she went to the dentist. Scheduled for an ENT appointment 03/20/22. Positional testing negative, however patient with significant dizziness upon sitting up from mat on L>R side. Able to perform elbow propping side to side with better tolerance. Patient requires consistent cues for safe positioning in sitting when dizziness occurs, including holding onto edge of seat or chair, placing feet on floor, and correcting L pulsion to maintain safety. Able to progress to unsupported sitting with EC and with head movements. Updated HEP with exercises that were tolerated today.  Recommended continues walker use and use of reacher for max safety. Patient reported understanding and without complaints at end of session.    OBJECTIVE IMPAIRMENTS Abnormal gait, decreased activity tolerance, decreased balance, decreased mobility, difficulty walking, dizziness, and postural dysfunction.   ACTIVITY LIMITATIONS cleaning, community activity, driving, meal prep, occupation, laundry, yard work, and shopping.   PERSONAL FACTORS Past/current experiences, Time since onset of injury/illness/exacerbation, and 1 comorbidity: history of back pain and surgery  are also affecting patient's functional  outcome.    REHAB POTENTIAL: Good  CLINICAL DECISION MAKING: Stable/uncomplicated  EVALUATION COMPLEXITY: Low   PLAN: PT FREQUENCY: 1-2x/week  PT DURATION: 6 weeks  PLANNED INTERVENTIONS: Therapeutic exercises, Therapeutic activity, Neuromuscular re-education, Balance training, Gait training, Patient/Family education, Joint mobilization, Vestibular training, Canalith repositioning, Manual therapy, and Re-evaluation  PLAN FOR NEXT SESSION: progress VOR and habituation, assess balance and goals.    Janene Harvey, PT, DPT 03/18/22 10:19 AM

## 2022-03-18 ENCOUNTER — Ambulatory Visit: Payer: 59 | Admitting: Physical Therapy

## 2022-03-18 ENCOUNTER — Encounter: Payer: Self-pay | Admitting: Physical Therapy

## 2022-03-18 DIAGNOSIS — R42 Dizziness and giddiness: Secondary | ICD-10-CM

## 2022-03-18 DIAGNOSIS — H8111 Benign paroxysmal vertigo, right ear: Secondary | ICD-10-CM

## 2022-03-20 ENCOUNTER — Ambulatory Visit: Payer: 59 | Admitting: Physical Therapy

## 2022-03-20 NOTE — Therapy (Signed)
OUTPATIENT PHYSICAL THERAPY VESTIBULAR TREATMENT     Patient Name: Melissa Santiago MRN: [id number] DOB:[birth year], 56 y.o., female Today's Date: 03/25/2022  PCP: [name], NP REFERRING PROVIDER: [name], NP   PT End of Session - 03/25/22 0847     Visit Number 4    Number of Visits 13    Date for PT Re-Evaluation 04/22/22    Authorization Type [employer]    PT Start Time 0803    PT Stop Time 0843    PT Time Calculation (min) 40 min    Equipment Utilized During Treatment Gait belt    Activity Tolerance Other (comment);Patient tolerated treatment well   dizziness, nausea, imbalance   Behavior During Therapy [facility] for tasks assessed/performed;Impulsive                Past Medical History:  Diagnosis Date   Acute lumbar radiculopathy 06/29/2013   Chest pain in adult 05/24/2018   Chronic constipation 05/24/2018   Gastroesophageal reflux disease 05/24/2018   GERD (gastroesophageal reflux disease)    Left sided sciatica 05/10/2013   Lumbar disc herniation 06/29/2013   Morbid obesity ([place]) 05/24/2018   Obesity    Osteopenia 03/23/2015   S/P bariatric surgery 06/30/2013   Past Surgical History:  Procedure Laterality Date   BREAST BIOPSY Left 2014   [place] RESECTION  2014   LUMBAR [place] SURGERY  2014/2016   Patient Active Problem List   Diagnosis Date Noted   Benign paroxysmal positional vertigo 03/10/2022   Acute otitis externa of right ear 03/10/2022   Lymphadenopathy 01/27/2022   Erroneous encounter - disregard 11/05/2021   Overactive bladder 06/17/2021   Impacted cerumen of left ear 06/17/2021   Anxiety and depression 05/03/2021   Liver mass 05/03/2021   Chest pain in adult 05/24/2018   Morbid obesity ([place]) 05/24/2018   Gastroesophageal reflux disease 05/24/2018   Chronic constipation 05/24/2018   Osteopenia 03/23/2015   S/P bariatric surgery 06/30/2013   Lumbar disc herniation 06/29/2013   Acute lumbar radiculopathy 06/29/2013    Left sided sciatica 05/10/2013    ONSET DATE: January 2023  REFERRING DIAG: H81.10 (ICD-10-CM) - Benign paroxysmal positional vertigo, unspecified laterality  THERAPY DIAG:  Dizziness and giddiness  BPPV (benign paroxysmal positional vertigo), right  SUBJECTIVE:   SUBJECTIVE STATEMENT: Saw her ENT "who agrees completely with you" and wants her to continue with vestibular. Has a referral for another ear Dr. Shann Medal that she had a really good week this week. HEP is now "easy."  Pt accompanied by: self, husband  PERTINENT HISTORY: From MD note on 03/09/2022: She complains of vertigo and right ear pain. She started experiencing vertigo since January, 2023. She reports feeling like there is something in her right ear. Her dizziness worsens when she has changes in her positions. She reports seeing an emergency room in [place] and was given meclizine and found relief after a week. She was given an MRI while in the emergency room and found no new issues. Her husband reports when her dizziness worsens she started experiencing nystagmus. She has seen an urgent care last week and was given meclizine and prednisone.  Unable to drive   PAIN:  Are you having pain? Yes: NPRS scale: 2/10 Pain location: R ear Pain description: ache Aggravating factors: none Relieving factors: none  PRECAUTIONS: Fall   PATIENT GOALS stop being dizzy.     TODAY'S TREATMENT: 03/25/22 Activity Comments  R/L forearm prop EO 5x, EC 5x Requiring consistent cues to hold onto  mat and set feet down to avoid LOB; c/o 3/10 dizziness with EO, 1/10 dizziness with EC  R Brandt daroff x2 Requiring consistent cues to hold onto mat and set feet down to avoid LOB; c/o 6/10 dizziness from R side- slightly improved with slower pace  L Laruth Bouchard daroff x2 C/o 5-6/10 dizziness at slow pace  standing wide with CGA head turns to targets 2x10 LOB requiring CGA/min A; cueing to decrease pace to improve tolerance d/t dizziness  standing  wide with CGA head nods to targets x10 LOB requiring min A; cueing to stop and reset balance to avoid worsening LOB  Sitting with feet wide head turns/nods with EC  10x each C/o 2/10 dizziness with turns, 4/10 with nods   PATIENT EDUCATION: Education details: HEP and edu on safety and family supervision/assist with exercises Person educated: Patient Education method: Explanation, Demonstration, Tactile cues, Verbal cues, and Handouts Education comprehension: verbalized understanding and returned demonstration  Access Code: [email or web address] URL: [email or web address] Date: 03/25/2022 Prepared by: [facility]  Program Notes  make sure you have family and a chair nearby for safety  Exercises - Seated Head Nods Vestibular Habituation (EO and EC)  - 1 x daily - 5 x weekly - 2-3 sets - 30 sec hold - Seated Gaze Stabilization with Head Rotation (EO and EC) - 1 x daily - 5 x weekly - 2-3 sets - 30 sec hold - Sit to Stand with Counter Support  - 1 x daily - 5 x weekly - 2 sets - 5 reps - Brandt-Daroff Vestibular Exercise  - 1 x daily - 5 x weekly - 2 sets - 3 reps  Dynamic Gait Index    1. Gait level surface __0___ (3) Normal: Walks 20', no assistive devices, good sped, no evidence for imbalance, normal gait pattern (2) Mild Impairment: Walks 20', uses assistive devices, slower speed, mild gait deviations. (1) Moderate Impairment: Walks 20', slow speed, abnormal gait pattern, evidence for imbalance. (0) Severe Impairment: Cannot walk 20' without assistance, severe gait deviations or imbalance.  2. Change in gait speed __0___ (3) Normal: Able to smoothly change walking speed without loss of balance or gait deviation. Shows a  significant difference in walking speeds between normal, fast and slow speeds. (2) Mild Impairment: Is able to change speed but demonstrates mild gait deviations, or not gait  deviations but unable to achieve a significant  change in velocity, or uses an assistive device. (1) Moderate Impairment: Makes only minor adjustments to walking speed, or accomplishes a change  in speed with significant gait deviations, or changes speed but has significant gait deviations, or  changes speed but loses balance but is able to recover and continue walking. (0) Severe Impairment: Cannot change speeds, or loses balance and has to reach for wall or be caught.  3. Gait with horizontal head turns __0___ (3) Normal: Performs head turns smoothly with no change in gait. (2) Mild Impairment: Performs head turns smoothly with slight change in gait velocity, i.e., minor  disruption to smooth gait path or uses walking aid. (1) Moderate Impairment: Performs head turns with moderate change in gait velocity, slows down,  staggers but recovers, can continue to walk. (0) Severe Impairment: Performs task with severe disruption of gait, i.e., staggers  outside 15" path, loses balance, stops, reaches for wall.  4. Gait with vertical head turns __-___ (3) Normal: Performs head turns smoothly with no change in gait. (2) Mild Impairment: Performs head turns  smoothly with slight change in gait velocity, i.e., minor  disruption to smooth gait path or uses walking aid. (1) Moderate Impairment: Performs head turns with moderate change in gait velocity, slows down,  staggers but recovers, can continue to walk. (0) Severe Impairment: Performs task with severe disruption of gait, i.e., staggers  outside 15" path, loses balance, stops, reaches for wall.  5. Gait and pivot turn __-___ (3) Normal: Pivot turns safely within 3 seconds and stops quickly with no loss of balance. (2) Mild Impairment: Pivot turns safely in > 3 seconds and stops with no loss of balance. (1) Moderate Impairment: Turns slowly, requires verbal cueing, requires several small steps to catch  balance following turn and stop. (0) Severe Impairment: Cannot turn safely, requires  assistance to turn and stop.  6. Step over obstacle __-___ (3) Normal: Is able to step over the box without changing gait speed, no evidence of imbalance. (2) Mild Impairment: Is able to step over box, but must slow down and adjust steps to clear box safely. (1) Moderate Impairment: Is able to step over box but must stop, then step over. May require verbal  cueing. (0) Severe Impairment: Cannot perform without assistance.  7. Step around obstacles __-___ (3) Normal: Is able to walk around cones safely without changing gait speed; no evidence of  imbalance. (2) Mild Impairment: Is able to step around both cones, but must slow down and adjust steps to clear  cones. (1) Moderate Impairment: Is able to clear cones but must significantly slow, speed to accomplish task,  or requires verbal cueing. (0) Severe Impairment: Unable to clear cones, walks into one or both cones, or requires physical  assistance.  8. Steps __-___ (3) Normal: Alternating feet, no rail. (2) Mild Impairment: Alternating feet, must use rail. (1) Moderate Impairment: Two feet to a stair, must use rail. (0) Severe Impairment: Cannot do safely.  TOTAL SCORE: __-___/ 24  Interpretation:  < 19/24 = predictive of falls in the elderly > 22/24 = safe ambulators    TREATMENT: 03/18/22 Activity Comments  R/L roll test negative negative  L sidelying test Negative but significant dizziness and LOB with L pulsion upon sitting up, requiring chair to stabilize   R sidelying test Negative but significant dizziness and LOB with L pulsion upon sitting up, requiring chair to stabilize; slightly less severe on this side  R/L elbow prop + gaze stabilization 2x3 each side C/o 2/10 dizziness; cues to allow symptoms to settle after each rep  STS with hands on knees and chair in front 2x5 CGA; posterior sway  Unsupported sitting with EC 30" Good stability   Unsupported sitting with head turns and nods EC 30" each Imbalance and  dizziness when stopping, requiring chair support    PATIENT EDUCATION: Education details: HEP; edu on benefits of RW and reacher for safety Person educated: Patient Education method: Explanation, Demonstration, Tactile cues, Verbal cues, and Handouts Education comprehension: verbalized understanding and returned demonstration   Access Code: [email or web address] URL: [email or web address] Date: 03/18/2022 Prepared by: [facility]  Program Notes  make sure you have family and a chair nearby for safety  Exercises - Seated Head Nods Vestibular Habituation (EO and EC)  - 1 x daily - 5 x weekly - 2-3 sets - 30 sec hold - Seated Gaze Stabilization with Head Rotation (EO and EC)  - 1 x daily - 5 x weekly - 2-3 sets - 30 sec hold - Sit to  Stand with Counter Support  - 1 x daily - 5 x weekly - 2 sets - 5 reps - Sidebending to Elbow Short Sit  - 1 x daily - 5 x weekly - 2 sets - 3-5 reps  TREATMENT: 03/14/22  OCULOMOTOR EXAM: *wearing bifocals and reports "feels that I have to wear them" d/t dizziness *listing to the L with exam and head appears to sway   Ocular Alignment: normal   Ocular ROM: normal   Spontaneous Nystagmus: normal   Gaze-Induced Nystagmus: normal   Smooth Pursuits: a couple saccades inferior direction   Saccades: normal   Convergence/Divergence: ~16 inches before c/o diplopia   VESTIBULAR - OCULAR REFLEX:    Slow VOR: slow and c/o significant dizziness horizontal; slow but asymptomatic with head nods   VOR Cancellation: intact but causing significant dizziness and patient listing heavily to L and nearly falling out of seat   Head-Impulse Test: positive on R; negative L      POSITIONAL TESTING:  Right Roll Test: negative Left Roll Test: c/o ~20 dizziness but no nystagmus Right Dix-Hallpike: negative Left Dix-Hallpike: negative       OBJECTIVE: measures were taken at time of initial evaluation unless otherwise  specified   DIAGNOSTIC FINDINGS: had MRI but not available for review  COGNITION: Overall cognitive status: Within functional limits for tasks assessed   POSTURE: rounded shoulders, forward head, decreased lumbar lordosis, and increased thoracic kyphosis   Cervical ROM:    Active A/PROM (deg) 03/11/2022  Flexion 65  Extension 50  Right lateral flexion   Left lateral flexion   Right rotation 80  Left rotation 80  (Blank rows = not tested)  STRENGTH: 5/5 bil LE strength, 5/5 bil UE strength   GAIT: Gait pattern: step through pattern Distance walked: 50 Assistive device utilized: None Level of assistance: Complete Independence Comments: noted occasional unsteadiness.   PATIENT SURVEYS:  DHI 84% = severe handicap   VESTIBULAR ASSESSMENT   GENERAL OBSERVATION: very slow guarded movements    SYMPTOM BEHAVIOR:   Subjective history: Pt. Reports dizziness "comes in waves" lasting several days, but actual spinning episodes are brief, lasting about a minute.  She reports multiple falls due to dizziness, especially at night getting up from bed.  "I can't live this way."    Non-Vestibular symptoms: nausea/vomiting and ear pain   Type of dizziness: Imbalance (Disequilibrium) and Spinning/Vertigo   Frequency: almost daily, comes in waves     Duration: 1 minute    Aggravating factors: Induced by position change: lying supine, rolling to the left, and supine to sit and Induced by motion: occur when walking, looking up at the ceiling, bending down to the ground, turning body quickly, turning head quickly, driving, and sitting in a moving car   Relieving factors: medication and closing eyes   Progression of symptoms: unchanged   OCULOMOTOR EXAM:   Ocular Alignment: normal   Ocular ROM: No Limitations   Spontaneous Nystagmus: absent   Gaze-Induced Nystagmus: absent   Smooth Pursuits: saccades   Saccades: extra eye movements   Convergence/Divergence: NT cm    VESTIBULAR - OCULAR  REFLEX:    Slow VOR: Comment: unable to perform today due to symptoms   VOR Cancellation: Comment: NA   Head-Impulse Test: HIT Right: NA   Dynamic Visual Acuity: Not able to be assessed    POSITIONAL TESTING: Right Dix-Hallpike: noted nystagmus however patient had to return to sitting due to severe nausea; Duration:NA Right Roll Test: none; Duration: NA  Left Roll Test: none; Duration: NA    MOTION SENSITIVITY:    Motion Sensitivity Quotient  Intensity: 0 = none, 1 = Lightheaded, 2 = Mild, 3 = Moderate, 4 = Severe, 5 = Vomiting  Intensity  1. Sitting to supine 4  2. Supine to L side   3. Supine to R side   4. Supine to sitting   5. L Hallpike-Dix   6. Up from L    7. R Hallpike-Dix 4  8. Up from R  4  9. Sitting, head  tipped to L knee   10. Head up from L  knee   11. Sitting, head  tipped to R knee   12. Head up from R  knee   13. Sitting head turns x5 4  14.Sitting head nods x5 4  15. In stance, 180  turn to L    16. In stance, 180  turn to R     OTHOSTATICS: not done  FUNCTIONAL GAIT:  not done today   VESTIBULAR TREATMENT:  Canalith Repositioning:   Epley Right: Number of Reps: 1, Response to Treatment: comment: increased dizziness, and Comment: was able to tolerate complete maneuver but increased dizziness following.   Gaze Adaptation: NA    Habituation: NA    Other: NA  PATIENT EDUCATION: Education details: education on BPPV, anatomy, plan.  Recommended avoiding large changes in head position today, use walker at night when getting up for bathroom for safety.   Person educated: Patient Education method: Explanation Education comprehension: verbalized understanding   GOALS: Goals reviewed with patient? Yes  SHORT TERM GOALS: Target date: 03/25/2022    1.  Patient will be independent with initial HEP.  Baseline:  none given Goal status: IN PROGRESS   LONG TERM GOALS: Target date: 04/22/2022   Patient will report 75% improvement in dizziness  symptoms.   Baseline:  Goal status: IN PROGRESS  2.  Patient to demonstrate 18/24 on DGI, indicating decreased risk of falls.  Baseline:  Goal status: IN PROGRESS  3.  Patient will report decreased impairment due to dizziness by scoring less than 52% on DHI. Baseline: 84% - severe impairment Goal status: IN PROGRESS  4.  Pt. Will be independent with progressed HEP to improve symptoms and carryover. Baseline:  Goal status: IN PROGRESS  5.  Patient will be able to return to driving safely without limitation from dizziness.  Baseline: unable to drive  Goal status: IN PROGRESS  ASSESSMENT:  CLINICAL IMPRESSION: Patient arrived to session with report of seeing her ENT and was advised to continue with PT. Attempted DGI however patient with significant LOB and requiring PT assist, thus did not complete remainder of testing. Patient able to perform elbow propping exercise with improved ease today; able to progress to [employer]. Patient still requires cues for safety and grounding between exercises. Progressed head turns to targets to standing which patient performed with increased symptoms and required CGA-min A for stability. Advised patient to continue sitting version at home for ma safety. Patient reported understanding of all edu and without complaints at end of session.    OBJECTIVE IMPAIRMENTS Abnormal gait, decreased activity tolerance, decreased balance, decreased mobility, difficulty walking, dizziness, and postural dysfunction.   ACTIVITY LIMITATIONS cleaning, community activity, driving, meal prep, occupation, laundry, yard work, and shopping.   PERSONAL FACTORS Past/current experiences, Time since onset of injury/illness/exacerbation, and 1 comorbidity: history of back pain and surgery  are also affecting patient's functional outcome.    REHAB POTENTIAL:  Good  CLINICAL DECISION MAKING: Stable/uncomplicated  EVALUATION COMPLEXITY: Low   PLAN: PT FREQUENCY:  1-2x/week  PT DURATION: 6 weeks  PLANNED INTERVENTIONS: Therapeutic exercises, Therapeutic activity, Neuromuscular re-education, Balance training, Gait training, Patient/Family education, Joint mobilization, Vestibular training, Canalith repositioning, Manual therapy, and Re-evaluation  PLAN FOR NEXT SESSION: progress VOR and habituation, assess balance and goals.    Janene Harvey, PT, DPT 03/25/22 8:47 AM

## 2022-03-25 ENCOUNTER — Encounter: Payer: Self-pay | Admitting: Physical Therapy

## 2022-03-25 ENCOUNTER — Ambulatory Visit: Payer: 59 | Admitting: Physical Therapy

## 2022-03-25 DIAGNOSIS — H8111 Benign paroxysmal vertigo, right ear: Secondary | ICD-10-CM

## 2022-03-25 DIAGNOSIS — R42 Dizziness and giddiness: Secondary | ICD-10-CM | POA: Diagnosis not present

## 2022-03-26 NOTE — Therapy (Incomplete)
OUTPATIENT PHYSICAL THERAPY VESTIBULAR TREATMENT     Patient Name: Melissa Santiago MRN: [id number] DOB:1966-04-27, 56 y.o., female Today's Date: 03/26/2022  PCP: [name], NP REFERRING PROVIDER: [name], NP        Past Medical History:  Diagnosis Date   Acute lumbar radiculopathy 06/29/2013   Chest pain in adult 05/24/2018   Chronic constipation 05/24/2018   Gastroesophageal reflux disease 05/24/2018   GERD (gastroesophageal reflux disease)    Left sided sciatica 05/10/2013   Lumbar disc herniation 06/29/2013   Morbid obesity ([place]) 05/24/2018   Obesity    Osteopenia 03/23/2015   S/P bariatric surgery 06/30/2013   Past Surgical History:  Procedure Laterality Date   BREAST BIOPSY Left 2014   [place] RESECTION  2014   LUMBAR [place] SURGERY  2014/2016   Patient Active Problem List   Diagnosis Date Noted   Benign paroxysmal positional vertigo 03/10/2022   Acute otitis externa of right ear 03/10/2022   Lymphadenopathy 01/27/2022   Erroneous encounter - disregard 11/05/2021   Overactive bladder 06/17/2021   Impacted cerumen of left ear 06/17/2021   Anxiety and depression 05/03/2021   Liver mass 05/03/2021   Chest pain in adult 05/24/2018   Morbid obesity ([place]) 05/24/2018   Gastroesophageal reflux disease 05/24/2018   Chronic constipation 05/24/2018   Osteopenia 03/23/2015   S/P bariatric surgery 06/30/2013   Lumbar disc herniation 06/29/2013   Acute lumbar radiculopathy 06/29/2013   Left sided sciatica 05/10/2013    ONSET DATE: January 2023  REFERRING DIAG: H81.10 (ICD-10-CM) - Benign paroxysmal positional vertigo, unspecified laterality  THERAPY DIAG:  No diagnosis found.  SUBJECTIVE:   SUBJECTIVE STATEMENT: Saw her ENT "who agrees completely with you" and wants her to continue with vestibular. Has a referral for another ear Dr. Shann Medal that she had a really good week this week. HEP is now "easy."  Pt accompanied by: self,  husband  PERTINENT HISTORY: From MD note on 03/09/2022: She complains of vertigo and right ear pain. She started experiencing vertigo since January, 2023. She reports feeling like there is something in her right ear. Her dizziness worsens when she has changes in her positions. She reports seeing an emergency room in [place] and was given meclizine and found relief after a week. She was given an MRI while in the emergency room and found no new issues. Her husband reports when her dizziness worsens she started experiencing nystagmus. She has seen an urgent care last week and was given meclizine and prednisone.  Unable to drive   PAIN:  Are you having pain? Yes: NPRS scale: 2/10 Pain location: R ear Pain description: ache Aggravating factors: none Relieving factors: none  PRECAUTIONS: Fall   PATIENT GOALS stop being dizzy.     TODAY'S TREATMENT: 03/27/22 Activity Comments  R/L brandt daroff   standing head turns/nods to targets    sitting VOR   march + gaze   romberg   STS + gaze on target   STS EC?      TREATMENT: 03/25/22 Activity Comments  R/L forearm prop EO 5x, EC 5x Requiring consistent cues to hold onto mat and set feet down to avoid LOB; c/o 3/10 dizziness with EO, 1/10 dizziness with EC  R Brandt daroff x2 Requiring consistent cues to hold onto mat and set feet down to avoid LOB; c/o 6/10 dizziness from R side- slightly improved with slower pace  L Laruth Bouchard daroff x2 C/o 5-6/10 dizziness at slow pace  standing wide with CGA head  turns to targets 2x10 LOB requiring CGA/min A; cueing to decrease pace to improve tolerance d/t dizziness  standing wide with CGA head nods to targets x10 LOB requiring min A; cueing to stop and reset balance to avoid worsening LOB  Sitting with feet wide head turns/nods with EC  10x each C/o 2/10 dizziness with turns, 4/10 with nods   PATIENT EDUCATION: Education details: HEP and edu on safety and family supervision/assist with exercises Person  educated: Patient Education method: Explanation, Demonstration, Tactile cues, Verbal cues, and Handouts Education comprehension: verbalized understanding and returned demonstration  Access Code: [email or web address] URL: [email or web address] Date: 03/25/2022 Prepared by: [facility]  Program Notes  make sure you have family and a chair nearby for safety  Exercises - Seated Head Nods Vestibular Habituation (EO and EC)  - 1 x daily - 5 x weekly - 2-3 sets - 30 sec hold - Seated Gaze Stabilization with Head Rotation (EO and EC) - 1 x daily - 5 x weekly - 2-3 sets - 30 sec hold - Sit to Stand with Counter Support  - 1 x daily - 5 x weekly - 2 sets - 5 reps - Brandt-Daroff Vestibular Exercise  - 1 x daily - 5 x weekly - 2 sets - 3 reps  Dynamic Gait Index    1. Gait level surface __0___ (3) Normal: Walks 20', no assistive devices, good sped, no evidence for imbalance, normal gait pattern (2) Mild Impairment: Walks 20', uses assistive devices, slower speed, mild gait deviations. (1) Moderate Impairment: Walks 20', slow speed, abnormal gait pattern, evidence for imbalance. (0) Severe Impairment: Cannot walk 20' without assistance, severe gait deviations or imbalance.  2. Change in gait speed __0___ (3) Normal: Able to smoothly change walking speed without loss of balance or gait deviation. Shows a  significant difference in walking speeds between normal, fast and slow speeds. (2) Mild Impairment: Is able to change speed but demonstrates mild gait deviations, or not gait  deviations but unable to achieve a significant change in velocity, or uses an assistive device. (1) Moderate Impairment: Makes only minor adjustments to walking speed, or accomplishes a change  in speed with significant gait deviations, or changes speed but has significant gait deviations, or  changes speed but loses balance but is able to recover and continue walking. (0) Severe  Impairment: Cannot change speeds, or loses balance and has to reach for wall or be caught.  3. Gait with horizontal head turns __0___ (3) Normal: Performs head turns smoothly with no change in gait. (2) Mild Impairment: Performs head turns smoothly with slight change in gait velocity, i.e., minor  disruption to smooth gait path or uses walking aid. (1) Moderate Impairment: Performs head turns with moderate change in gait velocity, slows down,  staggers but recovers, can continue to walk. (0) Severe Impairment: Performs task with severe disruption of gait, i.e., staggers  outside 15" path, loses balance, stops, reaches for wall.  4. Gait with vertical head turns __-___ (3) Normal: Performs head turns smoothly with no change in gait. (2) Mild Impairment: Performs head turns smoothly with slight change in gait velocity, i.e., minor  disruption to smooth gait path or uses walking aid. (1) Moderate Impairment: Performs head turns with moderate change in gait velocity, slows down,  staggers but recovers, can continue to walk. (0) Severe Impairment: Performs task with severe disruption of gait, i.e., staggers  outside 15" path, loses balance, stops, reaches for wall.  5. Gait and pivot turn __-___ (3) Normal: Pivot turns safely within 3 seconds and stops quickly with no loss of balance. (2) Mild Impairment: Pivot turns safely in > 3 seconds and stops with no loss of balance. (1) Moderate Impairment: Turns slowly, requires verbal cueing, requires several small steps to catch  balance following turn and stop. (0) Severe Impairment: Cannot turn safely, requires assistance to turn and stop.  6. Step over obstacle __-___ (3) Normal: Is able to step over the box without changing gait speed, no evidence of imbalance. (2) Mild Impairment: Is able to step over box, but must slow down and adjust steps to clear box safely. (1) Moderate Impairment: Is able to step over box but must stop, then step over.  May require verbal  cueing. (0) Severe Impairment: Cannot perform without assistance.  7. Step around obstacles __-___ (3) Normal: Is able to walk around cones safely without changing gait speed; no evidence of  imbalance. (2) Mild Impairment: Is able to step around both cones, but must slow down and adjust steps to clear  cones. (1) Moderate Impairment: Is able to clear cones but must significantly slow, speed to accomplish task,  or requires verbal cueing. (0) Severe Impairment: Unable to clear cones, walks into one or both cones, or requires physical  assistance.  8. Steps __-___ (3) Normal: Alternating feet, no rail. (2) Mild Impairment: Alternating feet, must use rail. (1) Moderate Impairment: Two feet to a stair, must use rail. (0) Severe Impairment: Cannot do safely.  TOTAL SCORE: __-___/ 24  Interpretation:  < 19/24 = predictive of falls in the elderly > 22/24 = safe ambulators    TREATMENT: 03/18/22 Activity Comments  R/L roll test negative negative  L sidelying test Negative but significant dizziness and LOB with L pulsion upon sitting up, requiring chair to stabilize   R sidelying test Negative but significant dizziness and LOB with L pulsion upon sitting up, requiring chair to stabilize; slightly less severe on this side  R/L elbow prop + gaze stabilization 2x3 each side C/o 2/10 dizziness; cues to allow symptoms to settle after each rep  STS with hands on knees and chair in front 2x5 CGA; posterior sway  Unsupported sitting with EC 30" Good stability   Unsupported sitting with head turns and nods EC 30" each Imbalance and dizziness when stopping, requiring chair support    PATIENT EDUCATION: Education details: HEP; edu on benefits of RW and reacher for safety Person educated: Patient Education method: Explanation, Demonstration, Tactile cues, Verbal cues, and Handouts Education comprehension: verbalized understanding and returned demonstration   Access Code:  [email or web address] URL: [email or web address] Date: 03/18/2022 Prepared by: [facility]  Program Notes  make sure you have family and a chair nearby for safety  Exercises - Seated Head Nods Vestibular Habituation (EO and EC)  - 1 x daily - 5 x weekly - 2-3 sets - 30 sec hold - Seated Gaze Stabilization with Head Rotation (EO and EC)  - 1 x daily - 5 x weekly - 2-3 sets - 30 sec hold - Sit to Stand with Counter Support  - 1 x daily - 5 x weekly - 2 sets - 5 reps - Sidebending to Elbow Short Sit  - 1 x daily - 5 x weekly - 2 sets - 3-5 reps  TREATMENT: 03/14/22  OCULOMOTOR EXAM: *wearing bifocals and reports "feels that I have to wear them" d/t dizziness *listing to the L with  exam and head appears to sway   Ocular Alignment: normal   Ocular ROM: normal   Spontaneous Nystagmus: normal   Gaze-Induced Nystagmus: normal   Smooth Pursuits: a couple saccades inferior direction   Saccades: normal   Convergence/Divergence: ~16 inches before c/o diplopia   VESTIBULAR - OCULAR REFLEX:    Slow VOR: slow and c/o significant dizziness horizontal; slow but asymptomatic with head nods   VOR Cancellation: intact but causing significant dizziness and patient listing heavily to L and nearly falling out of seat   Head-Impulse Test: positive on R; negative L      POSITIONAL TESTING:  Right Roll Test: negative Left Roll Test: c/o ~20 dizziness but no nystagmus Right Dix-Hallpike: negative Left Dix-Hallpike: negative       OBJECTIVE: measures were taken at time of initial evaluation unless otherwise specified   DIAGNOSTIC FINDINGS: had MRI but not available for review  COGNITION: Overall cognitive status: Within functional limits for tasks assessed   POSTURE: rounded shoulders, forward head, decreased lumbar lordosis, and increased thoracic kyphosis   Cervical ROM:    Active A/PROM (deg) 03/11/2022  Flexion 65  Extension 50  Right lateral  flexion   Left lateral flexion   Right rotation 80  Left rotation 80  (Blank rows = not tested)  STRENGTH: 5/5 bil LE strength, 5/5 bil UE strength   GAIT: Gait pattern: step through pattern Distance walked: 50 Assistive device utilized: None Level of assistance: Complete Independence Comments: noted occasional unsteadiness.   PATIENT SURVEYS:  DHI 84% = severe handicap   VESTIBULAR ASSESSMENT   GENERAL OBSERVATION: very slow guarded movements    SYMPTOM BEHAVIOR:   Subjective history: Pt. Reports dizziness "comes in waves" lasting several days, but actual spinning episodes are brief, lasting about a minute.  She reports multiple falls due to dizziness, especially at night getting up from bed.  "I can't live this way."    Non-Vestibular symptoms: nausea/vomiting and ear pain   Type of dizziness: Imbalance (Disequilibrium) and Spinning/Vertigo   Frequency: almost daily, comes in waves     Duration: 1 minute    Aggravating factors: Induced by position change: lying supine, rolling to the left, and supine to sit and Induced by motion: occur when walking, looking up at the ceiling, bending down to the ground, turning body quickly, turning head quickly, driving, and sitting in a moving car   Relieving factors: medication and closing eyes   Progression of symptoms: unchanged   OCULOMOTOR EXAM:   Ocular Alignment: normal   Ocular ROM: No Limitations   Spontaneous Nystagmus: absent   Gaze-Induced Nystagmus: absent   Smooth Pursuits: saccades   Saccades: extra eye movements   Convergence/Divergence: NT cm    VESTIBULAR - OCULAR REFLEX:    Slow VOR: Comment: unable to perform today due to symptoms   VOR Cancellation: Comment: NA   Head-Impulse Test: HIT Right: NA   Dynamic Visual Acuity: Not able to be assessed    POSITIONAL TESTING: Right Dix-Hallpike: noted nystagmus however patient had to return to sitting due to severe nausea; Duration:NA Right Roll Test: none; Duration:  NA Left Roll Test: none; Duration: NA    MOTION SENSITIVITY:    Motion Sensitivity Quotient  Intensity: 0 = none, 1 = Lightheaded, 2 = Mild, 3 = Moderate, 4 = Severe, 5 = Vomiting  Intensity  1. Sitting to supine 4  2. Supine to L side   3. Supine to R side   4. Supine to sitting  5. L Hallpike-Dix   6. Up from L    7. R Hallpike-Dix 4  8. Up from R  4  9. Sitting, head  tipped to L knee   10. Head up from L  knee   11. Sitting, head  tipped to R knee   12. Head up from R  knee   13. Sitting head turns x5 4  14.Sitting head nods x5 4  15. In stance, 180  turn to L    16. In stance, 180  turn to R     OTHOSTATICS: not done  FUNCTIONAL GAIT:  not done today   VESTIBULAR TREATMENT:  Canalith Repositioning:   Epley Right: Number of Reps: 1, Response to Treatment: comment: increased dizziness, and Comment: was able to tolerate complete maneuver but increased dizziness following.   Gaze Adaptation: NA    Habituation: NA    Other: NA  PATIENT EDUCATION: Education details: education on BPPV, anatomy, plan.  Recommended avoiding large changes in head position today, use walker at night when getting up for bathroom for safety.   Person educated: Patient Education method: Explanation Education comprehension: verbalized understanding   GOALS: Goals reviewed with patient? Yes  SHORT TERM GOALS: Target date: 03/25/2022    1.  Patient will be independent with initial HEP.  Baseline:  none given Goal status: IN PROGRESS   LONG TERM GOALS: Target date: 04/22/2022   Patient will report 75% improvement in dizziness symptoms.   Baseline:  Goal status: IN PROGRESS  2.  Patient to demonstrate 18/24 on DGI, indicating decreased risk of falls.  Baseline:  Goal status: IN PROGRESS  3.  Patient will report decreased impairment due to dizziness by scoring less than 52% on DHI. Baseline: 84% - severe impairment Goal status: IN PROGRESS  4.  Pt. Will be independent  with progressed HEP to improve symptoms and carryover. Baseline:  Goal status: IN PROGRESS  5.  Patient will be able to return to driving safely without limitation from dizziness.  Baseline: unable to drive  Goal status: IN PROGRESS  ASSESSMENT:  CLINICAL IMPRESSION: Patient arrived to session with report of seeing her ENT and was advised to continue with PT. Attempted DGI however patient with significant LOB and requiring PT assist, thus did not complete remainder of testing. Patient able to perform elbow propping exercise with improved ease today; able to progress to [employer]. Patient still requires cues for safety and grounding between exercises. Progressed head turns to targets to standing which patient performed with increased symptoms and required CGA-min A for stability. Advised patient to continue sitting version at home for ma safety. Patient reported understanding of all edu and without complaints at end of session.    OBJECTIVE IMPAIRMENTS Abnormal gait, decreased activity tolerance, decreased balance, decreased mobility, difficulty walking, dizziness, and postural dysfunction.   ACTIVITY LIMITATIONS cleaning, community activity, driving, meal prep, occupation, laundry, yard work, and shopping.   PERSONAL FACTORS Past/current experiences, Time since onset of injury/illness/exacerbation, and 1 comorbidity: history of back pain and surgery  are also affecting patient's functional outcome.    REHAB POTENTIAL: Good  CLINICAL DECISION MAKING: Stable/uncomplicated  EVALUATION COMPLEXITY: Low   PLAN: PT FREQUENCY: 1-2x/week  PT DURATION: 6 weeks  PLANNED INTERVENTIONS: Therapeutic exercises, Therapeutic activity, Neuromuscular re-education, Balance training, Gait training, Patient/Family education, Joint mobilization, Vestibular training, Canalith repositioning, Manual therapy, and Re-evaluation  PLAN FOR NEXT SESSION: progress VOR and habituation, assess balance and  goals.    Janene Harvey, PT,  DPT 03/26/22 5:05 PM

## 2022-03-27 ENCOUNTER — Ambulatory Visit: Payer: 59 | Admitting: Physical Therapy

## 2022-03-31 NOTE — Therapy (Signed)
OUTPATIENT PHYSICAL THERAPY VESTIBULAR TREATMENT     Patient Name: Melissa Santiago MRN: [id number] DOB:[birth year], 56 y.o., female Today's Date: 04/01/2022  PCP: [name], NP REFERRING PROVIDER: [name], NP   PT End of Session - 04/01/22 0802     Visit Number 5    Number of Visits 13    Date for PT Re-Evaluation 04/22/22    Authorization Type [employer]    PT Start Time 0803    PT Stop Time 0845    PT Time Calculation (min) 42 min    Equipment Utilized During Treatment Gait belt    Activity Tolerance Patient tolerated treatment well    Behavior During Therapy [facility] for tasks assessed/performed;Impulsive                 Past Medical History:  Diagnosis Date   Acute lumbar radiculopathy 06/29/2013   Chest pain in adult 05/24/2018   Chronic constipation 05/24/2018   Gastroesophageal reflux disease 05/24/2018   GERD (gastroesophageal reflux disease)    Left sided sciatica 05/10/2013   Lumbar disc herniation 06/29/2013   Morbid obesity ([place]) 05/24/2018   Obesity    Osteopenia 03/23/2015   S/P bariatric surgery 06/30/2013   Past Surgical History:  Procedure Laterality Date   BREAST BIOPSY Left 2014   [place] RESECTION  2014   LUMBAR [place] SURGERY  2014/2016   Patient Active Problem List   Diagnosis Date Noted   Benign paroxysmal positional vertigo 03/10/2022   Acute otitis externa of right ear 03/10/2022   Lymphadenopathy 01/27/2022   Erroneous encounter - disregard 11/05/2021   Overactive bladder 06/17/2021   Impacted cerumen of left ear 06/17/2021   Anxiety and depression 05/03/2021   Liver mass 05/03/2021   Chest pain in adult 05/24/2018   Morbid obesity ([place]) 05/24/2018   Gastroesophageal reflux disease 05/24/2018   Chronic constipation 05/24/2018   Osteopenia 03/23/2015   S/P bariatric surgery 06/30/2013   Lumbar disc herniation 06/29/2013   Acute lumbar radiculopathy 06/29/2013   Left sided sciatica 05/10/2013    ONSET  DATE: January 2023  REFERRING DIAG: H81.10 (ICD-10-CM) - Benign paroxysmal positional vertigo, unspecified laterality  THERAPY DIAG:  Dizziness and giddiness  BPPV (benign paroxysmal positional vertigo), right  SUBJECTIVE:   SUBJECTIVE STATEMENT: "I feel really good. Yesterday was the best day I've had." HEP is getting easier. Reports 2 falls since last session; skinned up the R elbow and knee. Denies head trauma. Feels like she got a little bit of whiplash because of how bad the neck and shoulders were hurting. Had a bad day on Sunday.   Pt accompanied by: self, husband  PERTINENT HISTORY: From MD note on 03/09/2022: She complains of vertigo and right ear pain. She started experiencing vertigo since January, 2023. She reports feeling like there is something in her right ear. Her dizziness worsens when she has changes in her positions. She reports seeing an emergency room in [place] and was given meclizine and found relief after a week. She was given an MRI while in the emergency room and found no new issues. Her husband reports when her dizziness worsens she started experiencing nystagmus. She has seen an urgent care last week and was given meclizine and prednisone.  Unable to drive   PAIN:  Are you having pain? Yes: NPRS scale: 2/10 Pain location: R ear Pain description: ache Aggravating factors: none Relieving factors: none  PRECAUTIONS: Fall   PATIENT GOALS stop being dizzy.     TODAY'S TREATMENT:  04/01/22 Activity Comments  R/L brandt daroff EO 2x each C/o 2/10 dizziness at slow pace; 3-4/10 dizziness and nausea at quick pace with PT assistance     R/L brandt daroff EC C/o 2/10 dizziness but PT assist required to avoid LOB upon sitting up from L side  sitting VOR horizontal and vertical 30" No dizziness; good gaze stability  standing VOR horizontal and vertical 30" C/o mild dizziness horizontal; posterior LOB requiring CGA/min A with vertical direction  Romberg at counter  30" Supervision required   STS + gaze on target 5x Good stability  STS EC 5x Min A for stability and redirecting weight anteriorly to correct posterior wt shift   Standing feel wide + EC 3x30" Occasional min A/ UE support d/t LOB  Searching + scanning for cones around gym CGA and occasional min A required d/t LOB; cues to widen BOS to avoid scissoring    PATIENT EDUCATION: Education details: HEP update; advised patient to use SPC on "bad days" Person educated: Patient and Spouse Education method: Explanation, Demonstration, Tactile cues, Verbal cues, and Handouts Education comprehension: verbalized understanding and returned demonstration  Access Code: [email or web address] URL: [email or web address] Date: 04/01/2022 Prepared by: [facility]  Program Notes  make sure you have family and a chair nearby for safety  Exercises - Brandt-Daroff Vestibular Exercise  - 1 x daily - 5 x weekly - 2 sets - 3 reps - Standing Gaze Stabilization with Head Rotation  - 1 x daily - 5 x weekly - 2-3 sets - 30 sec hold - Standing Gaze Stabilization with Head Nod  - 1 x daily - 5 x weekly - 2-3 sets - 30 sec hold - Standing Balance with Eyes Closed  - 1 x daily - 5 x weekly - 3 sets - 30 sec hold - Narrow Stance with Counter Support  - 1 x daily - 5 x weekly - 3 sets - 30 sec hold  OBJECTIVE: measures were taken at time of initial evaluation unless otherwise specified   DIAGNOSTIC FINDINGS: had MRI but not available for review  COGNITION: Overall cognitive status: Within functional limits for tasks assessed   POSTURE: rounded shoulders, forward head, decreased lumbar lordosis, and increased thoracic kyphosis   Cervical ROM:    Active A/PROM (deg) 03/11/2022  Flexion 65  Extension 50  Right lateral flexion   Left lateral flexion   Right rotation 80  Left rotation 80  (Blank rows = not tested)  STRENGTH: 5/5 bil LE strength, 5/5 bil UE  strength   GAIT: Gait pattern: step through pattern Distance walked: 50 Assistive device utilized: None Level of assistance: Complete Independence Comments: noted occasional unsteadiness.   PATIENT SURVEYS:  DHI 84% = severe handicap   VESTIBULAR ASSESSMENT   GENERAL OBSERVATION: very slow guarded movements    SYMPTOM BEHAVIOR:   Subjective history: Pt. Reports dizziness "comes in waves" lasting several days, but actual spinning episodes are brief, lasting about a minute.  She reports multiple falls due to dizziness, especially at night getting up from bed.  "I can't live this way."    Non-Vestibular symptoms: nausea/vomiting and ear pain   Type of dizziness: Imbalance (Disequilibrium) and Spinning/Vertigo   Frequency: almost daily, comes in waves     Duration: 1 minute    Aggravating factors: Induced by position change: lying supine, rolling to the left, and supine to sit and Induced by motion: occur when walking, looking up at the ceiling, bending down  to the ground, turning body quickly, turning head quickly, driving, and sitting in a moving car   Relieving factors: medication and closing eyes   Progression of symptoms: unchanged   OCULOMOTOR EXAM:   Ocular Alignment: normal   Ocular ROM: No Limitations   Spontaneous Nystagmus: absent   Gaze-Induced Nystagmus: absent   Smooth Pursuits: saccades   Saccades: extra eye movements   Convergence/Divergence: NT cm    VESTIBULAR - OCULAR REFLEX:    Slow VOR: Comment: unable to perform today due to symptoms   VOR Cancellation: Comment: NA   Head-Impulse Test: HIT Right: NA   Dynamic Visual Acuity: Not able to be assessed    POSITIONAL TESTING: Right Dix-Hallpike: noted nystagmus however patient had to return to sitting due to severe nausea; Duration:NA Right Roll Test: none; Duration: NA Left Roll Test: none; Duration: NA    MOTION SENSITIVITY:    Motion Sensitivity Quotient  Intensity: 0 = none, 1 = Lightheaded, 2 =  Mild, 3 = Moderate, 4 = Severe, 5 = Vomiting  Intensity  1. Sitting to supine 4  2. Supine to L side   3. Supine to R side   4. Supine to sitting   5. L Hallpike-Dix   6. Up from L    7. R Hallpike-Dix 4  8. Up from R  4  9. Sitting, head  tipped to L knee   10. Head up from L  knee   11. Sitting, head  tipped to R knee   12. Head up from R  knee   13. Sitting head turns x5 4  14.Sitting head nods x5 4  15. In stance, 180  turn to L    16. In stance, 180  turn to R     OTHOSTATICS: not done  FUNCTIONAL GAIT:  not done today   VESTIBULAR TREATMENT:  Canalith Repositioning:   Epley Right: Number of Reps: 1, Response to Treatment: comment: increased dizziness, and Comment: was able to tolerate complete maneuver but increased dizziness following.   Gaze Adaptation: NA    Habituation: NA    Other: NA  PATIENT EDUCATION: Education details: education on BPPV, anatomy, plan.  Recommended avoiding large changes in head position today, use walker at night when getting up for bathroom for safety.   Person educated: Patient Education method: Explanation Education comprehension: verbalized understanding   GOALS: Goals reviewed with patient? Yes  SHORT TERM GOALS: Target date: 03/25/2022    1.  Patient will be independent with initial HEP.  Baseline:  none given Goal status: IN PROGRESS   LONG TERM GOALS: Target date: 04/22/2022   Patient will report 75% improvement in dizziness symptoms.   Baseline:  Goal status: IN PROGRESS  2.  Patient to demonstrate 18/24 on DGI, indicating decreased risk of falls.  Baseline:  Goal status: IN PROGRESS  3.  Patient will report decreased impairment due to dizziness by scoring less than 52% on DHI. Baseline: 84% - severe impairment Goal status: IN PROGRESS  4.  Pt. Will be independent with progressed HEP to improve symptoms and carryover. Baseline:  Goal status: IN PROGRESS  5.  Patient will be able to return to driving  safely without limitation from dizziness.  Baseline: unable to drive  Goal status: IN PROGRESS  ASSESSMENT:  CLINICAL IMPRESSION: Patient arrived to session with report of improved dizziness and demonstrating improved stability when walking into clinic today. After discussion with patient and her husband, she admits to 2 falls since  last session. Denies head trauma but reports that she suspects a that she had a whiplash. Encouraged patient to use SPC on days when dizziness/imbalance is more of a problem. Patient and husband reported understanding. Patient demonstrated improved tolerance for habituation today. Worked on progressing this with EC and at quicker pace; the latter brought on more severe dizziness and nausea. Able to work on VOR activities in standing with posterior LOB in vertical direction. Updated HEP and discussed max safety with HEP to avoid falls. Patient and husband reported understanding and without complaints at end of session.    OBJECTIVE IMPAIRMENTS Abnormal gait, decreased activity tolerance, decreased balance, decreased mobility, difficulty walking, dizziness, and postural dysfunction.   ACTIVITY LIMITATIONS cleaning, community activity, driving, meal prep, occupation, laundry, yard work, and shopping.   PERSONAL FACTORS Past/current experiences, Time since onset of injury/illness/exacerbation, and 1 comorbidity: history of back pain and surgery  are also affecting patient's functional outcome.    REHAB POTENTIAL: Good  CLINICAL DECISION MAKING: Stable/uncomplicated  EVALUATION COMPLEXITY: Low   PLAN: PT FREQUENCY: 1-2x/week  PT DURATION: 6 weeks  PLANNED INTERVENTIONS: Therapeutic exercises, Therapeutic activity, Neuromuscular re-education, Balance training, Gait training, Patient/Family education, Joint mobilization, Vestibular training, Canalith repositioning, Manual therapy, and Re-evaluation  PLAN FOR NEXT SESSION: progress VOR and habituation, assess DGI  when able to tolerate this assessment  Janene Harvey, PT, DPT 04/01/22 9:02 AM  [facility] at [facility] [address] [place], [place] [address] Phone # [phone] Fax # [phone]

## 2022-04-01 ENCOUNTER — Encounter: Payer: Self-pay | Admitting: Physical Therapy

## 2022-04-01 ENCOUNTER — Ambulatory Visit: Payer: 59 | Attending: Family | Admitting: Physical Therapy

## 2022-04-01 DIAGNOSIS — H8111 Benign paroxysmal vertigo, right ear: Secondary | ICD-10-CM | POA: Diagnosis present

## 2022-04-01 DIAGNOSIS — R42 Dizziness and giddiness: Secondary | ICD-10-CM | POA: Diagnosis present

## 2022-04-02 ENCOUNTER — Ambulatory Visit: Payer: 59 | Admitting: Physical Therapy

## 2022-04-02 NOTE — Therapy (Incomplete)
OUTPATIENT PHYSICAL THERAPY VESTIBULAR TREATMENT     Patient Name: Melissa Santiago MRN: [id number] DOB:1966/06/05, 56 y.o., female Today's Date: 04/02/2022  PCP: [name], NP REFERRING PROVIDER: [name], NP         Past Medical History:  Diagnosis Date   Acute lumbar radiculopathy 06/29/2013   Chest pain in adult 05/24/2018   Chronic constipation 05/24/2018   Gastroesophageal reflux disease 05/24/2018   GERD (gastroesophageal reflux disease)    Left sided sciatica 05/10/2013   Lumbar disc herniation 06/29/2013   Morbid obesity ([place]) 05/24/2018   Obesity    Osteopenia 03/23/2015   S/P bariatric surgery 06/30/2013   Past Surgical History:  Procedure Laterality Date   BREAST BIOPSY Left 2014   [place] RESECTION  2014   LUMBAR [place] SURGERY  2014/2016   Patient Active Problem List   Diagnosis Date Noted   Benign paroxysmal positional vertigo 03/10/2022   Acute otitis externa of right ear 03/10/2022   Lymphadenopathy 01/27/2022   Erroneous encounter - disregard 11/05/2021   Overactive bladder 06/17/2021   Impacted cerumen of left ear 06/17/2021   Anxiety and depression 05/03/2021   Liver mass 05/03/2021   Chest pain in adult 05/24/2018   Morbid obesity ([place]) 05/24/2018   Gastroesophageal reflux disease 05/24/2018   Chronic constipation 05/24/2018   Osteopenia 03/23/2015   S/P bariatric surgery 06/30/2013   Lumbar disc herniation 06/29/2013   Acute lumbar radiculopathy 06/29/2013   Left sided sciatica 05/10/2013    ONSET DATE: January 2023  REFERRING DIAG: H81.10 (ICD-10-CM) - Benign paroxysmal positional vertigo, unspecified laterality  THERAPY DIAG:  No diagnosis found.  SUBJECTIVE:   SUBJECTIVE STATEMENT: "I feel really good. Yesterday was the best day I've had." HEP is getting easier. Reports 2 falls since last session; skinned up the R elbow and knee. Denies head trauma. Feels like she got a little bit of whiplash  because of how bad the neck and shoulders were hurting. Had a bad day on Sunday.   Pt accompanied by: self, husband  PERTINENT HISTORY: From MD note on 03/09/2022: She complains of vertigo and right ear pain. She started experiencing vertigo since January, 2023. She reports feeling like there is something in her right ear. Her dizziness worsens when she has changes in her positions. She reports seeing an emergency room in [place] and was given meclizine and found relief after a week. She was given an MRI while in the emergency room and found no new issues. Her husband reports when her dizziness worsens she started experiencing nystagmus. She has seen an urgent care last week and was given meclizine and prednisone.  Unable to drive   PAIN:  Are you having pain? Yes: NPRS scale: 2/10 Pain location: R ear Pain description: ache Aggravating factors: none Relieving factors: none  PRECAUTIONS: Fall   PATIENT GOALS stop being dizzy.    OBJECTIVE:   TODAY'S TREATMENT: 04/03/22 Activity Comments                      Last updated 04/01/22: Access Code: [email or web address] URL: [email or web address] Date: 04/01/2022 Prepared by: [facility]  Program Notes  make sure you have family and a chair nearby for safety  Exercises - Brandt-Daroff Vestibular Exercise  - 1 x daily - 5 x weekly - 2 sets - 3 reps - Standing Gaze Stabilization with Head Rotation  - 1 x daily - 5 x weekly - 2-3 sets -  30 sec hold - Standing Gaze Stabilization with Head Nod  - 1 x daily - 5 x weekly - 2-3 sets - 30 sec hold - Standing Balance with Eyes Closed  - 1 x daily - 5 x weekly - 3 sets - 30 sec hold - Narrow Stance with Counter Support  - 1 x daily - 5 x weekly - 3 sets - 30 sec hold    measures were taken at time of initial evaluation unless otherwise specified   DIAGNOSTIC FINDINGS: had MRI but not available for review  COGNITION: Overall cognitive status:  Within functional limits for tasks assessed   POSTURE: rounded shoulders, forward head, decreased lumbar lordosis, and increased thoracic kyphosis   Cervical ROM:    Active A/PROM (deg) 03/11/2022  Flexion 65  Extension 50  Right lateral flexion   Left lateral flexion   Right rotation 80  Left rotation 80  (Blank rows = not tested)  STRENGTH: 5/5 bil LE strength, 5/5 bil UE strength   GAIT: Gait pattern: step through pattern Distance walked: 50 Assistive device utilized: None Level of assistance: Complete Independence Comments: noted occasional unsteadiness.   PATIENT SURVEYS:  DHI 84% = severe handicap   VESTIBULAR ASSESSMENT   GENERAL OBSERVATION: very slow guarded movements    SYMPTOM BEHAVIOR:   Subjective history: Pt. Reports dizziness "comes in waves" lasting several days, but actual spinning episodes are brief, lasting about a minute.  She reports multiple falls due to dizziness, especially at night getting up from bed.  "I can't live this way."    Non-Vestibular symptoms: nausea/vomiting and ear pain   Type of dizziness: Imbalance (Disequilibrium) and Spinning/Vertigo   Frequency: almost daily, comes in waves     Duration: 1 minute    Aggravating factors: Induced by position change: lying supine, rolling to the left, and supine to sit and Induced by motion: occur when walking, looking up at the ceiling, bending down to the ground, turning body quickly, turning head quickly, driving, and sitting in a moving car   Relieving factors: medication and closing eyes   Progression of symptoms: unchanged   OCULOMOTOR EXAM:   Ocular Alignment: normal   Ocular ROM: No Limitations   Spontaneous Nystagmus: absent   Gaze-Induced Nystagmus: absent   Smooth Pursuits: saccades   Saccades: extra eye movements   Convergence/Divergence: NT cm    VESTIBULAR - OCULAR REFLEX:    Slow VOR: Comment: unable to perform today due to symptoms   VOR Cancellation: Comment:  NA   Head-Impulse Test: HIT Right: NA   Dynamic Visual Acuity: Not able to be assessed    POSITIONAL TESTING: Right Dix-Hallpike: noted nystagmus however patient had to return to sitting due to severe nausea; Duration:NA Right Roll Test: none; Duration: NA Left Roll Test: none; Duration: NA    MOTION SENSITIVITY:    Motion Sensitivity Quotient  Intensity: 0 = none, 1 = Lightheaded, 2 = Mild, 3 = Moderate, 4 = Severe, 5 = Vomiting  Intensity  1. Sitting to supine 4  2. Supine to L side   3. Supine to R side   4. Supine to sitting   5. L Hallpike-Dix   6. Up from L    7. R Hallpike-Dix 4  8. Up from R  4  9. Sitting, head  tipped to L knee   10. Head up from L  knee   11. Sitting, head  tipped to R knee   12. Head up from R  knee   13. Sitting head turns x5 4  14.Sitting head nods x5 4  15. In stance, 180  turn to L    16. In stance, 180  turn to R     OTHOSTATICS: not done  FUNCTIONAL GAIT:  not done today   VESTIBULAR TREATMENT:  Canalith Repositioning:   Epley Right: Number of Reps: 1, Response to Treatment: comment: increased dizziness, and Comment: was able to tolerate complete maneuver but increased dizziness following.   Gaze Adaptation: NA    Habituation: NA    Other: NA  PATIENT EDUCATION: Education details: education on BPPV, anatomy, plan.  Recommended avoiding large changes in head position today, use walker at night when getting up for bathroom for safety.   Person educated: Patient Education method: Explanation Education comprehension: verbalized understanding   GOALS: Goals reviewed with patient? Yes  SHORT TERM GOALS: Target date: 03/25/2022    1.  Patient will be independent with initial HEP.  Baseline:  none given Goal status: IN PROGRESS   LONG TERM GOALS: Target date: 04/22/2022   Patient will report 75% improvement in dizziness symptoms.   Baseline:  Goal status: IN PROGRESS  2.  Patient to demonstrate 18/24 on DGI,  indicating decreased risk of falls.  Baseline:  Goal status: IN PROGRESS  3.  Patient will report decreased impairment due to dizziness by scoring less than 52% on DHI. Baseline: 84% - severe impairment Goal status: IN PROGRESS  4.  Pt. Will be independent with progressed HEP to improve symptoms and carryover. Baseline:  Goal status: IN PROGRESS  5.  Patient will be able to return to driving safely without limitation from dizziness.  Baseline: unable to drive  Goal status: IN PROGRESS  ASSESSMENT:  CLINICAL IMPRESSION: Patient arrived to session with report of improved dizziness and demonstrating improved stability when walking into clinic today. After discussion with patient and her husband, she admits to 2 falls since last session. Denies head trauma but reports that she suspects a that she had a whiplash. Encouraged patient to use SPC on days when dizziness/imbalance is more of a problem. Patient and husband reported understanding. Patient demonstrated improved tolerance for habituation today. Worked on progressing this with EC and at quicker pace; the latter brought on more severe dizziness and nausea. Able to work on VOR activities in standing with posterior LOB in vertical direction. Updated HEP and discussed max safety with HEP to avoid falls. Patient and husband reported understanding and without complaints at end of session.    OBJECTIVE IMPAIRMENTS Abnormal gait, decreased activity tolerance, decreased balance, decreased mobility, difficulty walking, dizziness, and postural dysfunction.   ACTIVITY LIMITATIONS cleaning, community activity, driving, meal prep, occupation, laundry, yard work, and shopping.   PERSONAL FACTORS Past/current experiences, Time since onset of injury/illness/exacerbation, and 1 comorbidity: history of back pain and surgery  are also affecting patient's functional outcome.    REHAB POTENTIAL: Good  CLINICAL DECISION MAKING:  Stable/uncomplicated  EVALUATION COMPLEXITY: Low   PLAN: PT FREQUENCY: 1-2x/week  PT DURATION: 6 weeks  PLANNED INTERVENTIONS: Therapeutic exercises, Therapeutic activity, Neuromuscular re-education, Balance training, Gait training, Patient/Family education, Joint mobilization, Vestibular training, Canalith repositioning, Manual therapy, and Re-evaluation  PLAN FOR NEXT SESSION: progress VOR and habituation, assess DGI when able to tolerate this assessment  Janene Harvey, PT, DPT 04/02/22 8:22 AM  [facility] at [facility] [address], [place], [place] [address] Phone # [phone] Fax # [phone]

## 2022-04-03 ENCOUNTER — Ambulatory Visit: Payer: 59 | Admitting: Physical Therapy

## 2022-04-07 ENCOUNTER — Ambulatory Visit (INDEPENDENT_AMBULATORY_CARE_PROVIDER_SITE_OTHER): Payer: 59 | Admitting: Family

## 2022-04-07 VITALS — BP 127/88 | HR 87 | Temp 98.0°F | Resp 16 | Wt 218.0 lb

## 2022-04-07 DIAGNOSIS — R5383 Other fatigue: Secondary | ICD-10-CM | POA: Diagnosis not present

## 2022-04-07 DIAGNOSIS — F419 Anxiety disorder, unspecified: Secondary | ICD-10-CM

## 2022-04-07 DIAGNOSIS — H8120 Vestibular neuronitis, unspecified ear: Secondary | ICD-10-CM | POA: Diagnosis not present

## 2022-04-07 DIAGNOSIS — K219 Gastro-esophageal reflux disease without esophagitis: Secondary | ICD-10-CM | POA: Diagnosis not present

## 2022-04-07 DIAGNOSIS — F32A Depression, unspecified: Secondary | ICD-10-CM

## 2022-04-07 LAB — CBC WITH DIFFERENTIAL/PLATELET
Basophils Absolute: 0 10*3/uL (ref 0.0–0.1)
Basophils Relative: 1.1 % (ref 0.0–3.0)
Eosinophils Absolute: 0.1 10*3/uL (ref 0.0–0.7)
Eosinophils Relative: 2 % (ref 0.0–5.0)
HCT: 42.9 % (ref 36.0–46.0)
Hemoglobin: 14.2 g/dL (ref 12.0–15.0)
Lymphocytes Relative: 30.2 % (ref 12.0–46.0)
Lymphs Abs: 1.3 10*3/uL (ref 0.7–4.0)
MCHC: 33.2 g/dL (ref 30.0–36.0)
MCV: 91.4 fl (ref 78.0–100.0)
Monocytes Absolute: 0.3 10*3/uL (ref 0.1–1.0)
Monocytes Relative: 7.5 % (ref 3.0–12.0)
Neutro Abs: 2.5 10*3/uL (ref 1.4–7.7)
Neutrophils Relative %: 59.2 % (ref 43.0–77.0)
Platelets: 174 10*3/uL (ref 150.0–400.0)
RBC: 4.69 Mil/uL (ref 3.87–5.11)
RDW: 13.1 % (ref 11.5–15.5)
WBC: 4.2 10*3/uL (ref 4.0–10.5)

## 2022-04-07 LAB — COMPREHENSIVE METABOLIC PANEL
ALT: 49 U/L — ABNORMAL HIGH (ref 0–35)
AST: 49 U/L — ABNORMAL HIGH (ref 0–37)
Albumin: 4.1 g/dL (ref 3.5–5.2)
Alkaline Phosphatase: 111 U/L (ref 39–117)
BUN: 10 mg/dL (ref 6–23)
CO2: 29 mEq/L (ref 19–32)
Calcium: 9.5 mg/dL (ref 8.4–10.5)
Chloride: 101 mEq/L (ref 96–112)
Creatinine, Ser: 0.77 mg/dL (ref 0.40–1.20)
GFR: 87.66 mL/min (ref >60.00)
Glucose, Bld: 87 mg/dL (ref 70–99)
Potassium: 4.7 mEq/L (ref 3.5–5.1)
Sodium: 139 mEq/L (ref 135–145)
Total Bilirubin: 0.7 mg/dL (ref 0.2–1.2)
Total Protein: 6.3 g/dL (ref 6.0–8.3)

## 2022-04-07 LAB — TSH: TSH: 0.99 u[IU]/mL (ref 0.35–5.50)

## 2022-04-07 NOTE — Assessment & Plan Note (Signed)
Stable on cymbalta. Continue same.  

## 2022-04-07 NOTE — Patient Instructions (Signed)
Please complete lab work prior to leaving.   

## 2022-04-07 NOTE — Assessment & Plan Note (Signed)
Stable with prn use of omeprazole.  

## 2022-04-07 NOTE — Assessment & Plan Note (Signed)
She is following now with ENT, PT for vestibular rehab and Audiology. Overall symptoms are improving.

## 2022-04-07 NOTE — Progress Notes (Signed)
Subjective:   By signing my name below, I, [name], attest that this documentation has been prepared under the direction and in the presence of [name], NP 04/07/2022      Patient ID: Melissa Santiago, female    DOB: 03/02/1966, 56 y.o.   MRN: [id number]  Chief Complaint  Patient presents with   Dizziness    Here for follow up of vertigo, "feeling better, still some dizziness"    Dizziness   Patient is in today for a office visit.   Dizziness- She continues having dizziness for the past month. She found improvements since her symptoms started. She notes when her symptoms worsen she has to use a walker. She started seeing neurotherapy to manage her symptoms and found improvements. She continues having sessions with her neurotherapy 2x weekly. She notes her ENT found her vertigo was most likely caused after a infection settled in her ears. She was told her condition is chronic and will most likely not go away. She was told to stop using meclizine by her neurotherapist and only takes it when her symptoms worsen.  Fatigue- She complains of constant fatigue. She denies having fatigue. She is requesting to have lab work for further evaluations of her symptoms.  Cymbalta- She continues using 60 mg Cymbalta and reports her mood is doing well while taking it.  Omeprazole- She continues using 40 mg omeprazole and is now using it only PRN now.    Health Maintenance Due  Topic Date Due   HIV Screening  Never done   Hepatitis C Screening  Never done   Zoster Vaccines- Shingrix (1 of 2) Never done   COVID-19 Vaccine (4 - Moderna series) 10/29/2020    Past Medical History:  Diagnosis Date   Acute lumbar radiculopathy 06/29/2013   Chest pain in adult 05/24/2018   Chronic constipation 05/24/2018   Gastroesophageal reflux disease 05/24/2018   GERD (gastroesophageal reflux disease)    Left sided sciatica 05/10/2013   Lumbar disc herniation 06/29/2013   Morbid obesity ([place]) 05/24/2018    Obesity    Osteopenia 03/23/2015   S/P bariatric surgery 06/30/2013    Past Surgical History:  Procedure Laterality Date   BREAST BIOPSY Left 2014   [place] RESECTION  2014   LUMBAR [place] SURGERY  2014/2016    Family History  Problem Relation Age of Onset   Arthritis Mother    Early death Mother    Mental illness Mother    Alcohol abuse Father    Arthritis Father    Early death Sister    Thyroid cancer Sister    Hypertension Sister    Heart attack Maternal Grandfather    Uterine cancer Sister    Hypertension Sister    Cervical cancer Sister    Depression Sister    Hypertension Sister    Colon cancer Neg Hx    Esophageal cancer Neg Hx    Breast cancer Neg Hx    Rectal cancer Neg Hx    Stomach cancer Neg Hx     Social History   Socioeconomic History   Marital status: Married    Spouse name: [name]   Number of children: 2   Years of education: Not on file   Highest education level: Not on file  Occupational History   Occupation: Sales promotion account executive  Tobacco Use   Smoking status: Former    Packs/day: 0.50    Years: 15.00    Total pack years: 7.50  Types: Cigarettes    Quit date: 2006    Years since quitting: 17.4   Smokeless tobacco: Never  Vaping Use   Vaping Use: Never used  Substance and Sexual Activity   Alcohol use: Yes    Comment: rarely   Drug use: Never   Sexual activity: Yes    Partners: Male    Birth control/protection: I.U.D.  Other Topics Concern   Not on file  Social History Narrative   From [place], Divorced since 2015   Works as a Geophysical data processor for [employer]   2 daughters- one in the [employer]- lives in [place], one in [place]- works as a Pharmacist, hospital   Enjoys spending time with her husband   Has a dog and will adopt another soon.    Social Determinants of Health   Financial Resource Strain: Low Risk  (05/24/2018)   Overall Financial Resource Strain (CARDIA)    Difficulty of Paying Living Expenses: Not hard  at all  Food Insecurity: No Food Insecurity (05/24/2018)   Hunger Vital Sign    Worried About Running Out of Food in the Last Year: Never true    Ran Out of Food in the Last Year: Never true  Transportation Needs: No Transportation Needs (05/24/2018)   PRAPARE - Hydrologist (Medical): No    Lack of Transportation (Non-Medical): No  Physical Activity: Unknown (05/24/2018)   Exercise Vital Sign    Days of Exercise per Week: 0 days    Minutes of Exercise per Session: Not on file  Stress: No Stress Concern Present (05/24/2018)   [place]    Feeling of Stress : Only a little  Social Connections: Somewhat Isolated (05/24/2018)   Social Connection and Isolation Panel [NHANES]    Frequency of Communication with Friends and Family: More than three times a week    Frequency of Social Gatherings with Friends and Family: Never    Attends Religious Services: Never    Marine scientist or Organizations: No    Attends Archivist Meetings: Never    Marital Status: Married  Human resources officer Violence: Not At Risk (05/24/2018)   Humiliation, Afraid, Rape, and Kick questionnaire    Fear of Current or Ex-Partner: No    Emotionally Abused: No    Physically Abused: No    Sexually Abused: No    Outpatient Medications Prior to Visit  Medication Sig Dispense Refill   DULoxetine (CYMBALTA) 60 MG capsule TAKE 1 CAPSULE BY MOUTH EVERY DAY 90 capsule 1   neomycin-polymyxin-hydrocortisone (CORTISPORIN) OTIC solution Place 3 drops into the right ear 4 (four) times daily. 10 mL 0   omeprazole (PRILOSEC) 40 MG capsule Take 1 capsule (40 mg total) by mouth in the morning and at bedtime. 180 capsule 1   meclizine (ANTIVERT) 25 MG tablet Take 1 tablet (25 mg total) by mouth 3 (three) times daily as needed for dizziness. 30 tablet 1   No facility-administered medications prior to visit.    Allergies   Allergen Reactions   Tolmetin     Patient reports taking OTC ibuprofen and is not allergic     Review of Systems  Constitutional:  Positive for malaise/fatigue (constant).  Neurological:  Positive for dizziness.       Objective:    Physical Exam Constitutional:      General: She is not in acute distress.    Appearance: Normal appearance. She is not  ill-appearing.  HENT:     Head: Normocephalic and atraumatic.     Right Ear: External ear normal.     Left Ear: External ear normal.  Eyes:     Extraocular Movements: Extraocular movements intact.     Pupils: Pupils are equal, round, and reactive to light.  Cardiovascular:     Rate and Rhythm: Normal rate and regular rhythm.     Heart sounds: Normal heart sounds. No murmur heard.    No gallop.  Pulmonary:     Effort: Pulmonary effort is normal. No respiratory distress.     Breath sounds: Normal breath sounds. No wheezing or rales.  Skin:    General: Skin is warm and dry.  Neurological:     Mental Status: She is alert and oriented to person, place, and time.  Psychiatric:        Judgment: Judgment normal.     BP 127/88 (BP Location: Right Arm, Patient Position: Sitting, Cuff Size: Small)   Pulse 87   Temp 98 F (36.7 C) (Oral)   Resp 16   Wt 218 lb (98.9 kg)   SpO2 97%   BMI 36.28 kg/m  Wt Readings from Last 3 Encounters:  04/07/22 218 lb (98.9 kg)  01/27/22 223 lb 6.4 oz (101.3 kg)  06/17/21 241 lb (109.3 kg)       Assessment & Plan:   Problem List Items Addressed This Visit       Unprioritized   Vestibular neuritis    She is following now with ENT, PT for vestibular rehab and Audiology. Overall symptoms are improving.       Gastroesophageal reflux disease    Stable with prn use of omeprazole.        Fatigue - Primary    New. Requesting lab work. Labs as ordered.  She states she does not snore.       Relevant Orders   Comp Met (CMET)   TSH   CBC with Differential/Platelet   Anxiety and  depression    Stable on cymbalta. Continue same.         No orders of the defined types were placed in this encounter.   I, Nance Pear, NP, personally preformed the services described in this documentation.  All medical record entries made by the scribe were at my direction and in my presence.  I have reviewed the chart and discharge instructions (if applicable) and agree that the record reflects my personal performance and is accurate and complete. 04/07/2022   I,[name],acting as a Education administrator for Nance Pear, NP.,have documented all relevant documentation on the behalf of Nance Pear, NP,as directed by  Nance Pear, NP while in the presence of Nance Pear, NP.   Nance Pear, NP

## 2022-04-07 NOTE — Therapy (Incomplete)
OUTPATIENT PHYSICAL THERAPY VESTIBULAR TREATMENT     Patient Name: Melissa Santiago MRN: [id number] DOB:11-23-1965, 56 y.o., female Today's Date: 04/07/2022  PCP: [name], NP REFERRING PROVIDER: [name], NP         Past Medical History:  Diagnosis Date   Acute lumbar radiculopathy 06/29/2013   Chest pain in adult 05/24/2018   Chronic constipation 05/24/2018   Gastroesophageal reflux disease 05/24/2018   GERD (gastroesophageal reflux disease)    Left sided sciatica 05/10/2013   Lumbar disc herniation 06/29/2013   Morbid obesity ([place]) 05/24/2018   Obesity    Osteopenia 03/23/2015   S/P bariatric surgery 06/30/2013   Past Surgical History:  Procedure Laterality Date   BREAST BIOPSY Left 2014   [place] RESECTION  2014   LUMBAR [place] SURGERY  2014/2016   Patient Active Problem List   Diagnosis Date Noted   Vestibular neuritis 04/07/2022   Fatigue 04/07/2022   Acute otitis externa of right ear 03/10/2022   Lymphadenopathy 01/27/2022   Erroneous encounter - disregard 11/05/2021   Overactive bladder 06/17/2021   Impacted cerumen of left ear 06/17/2021   Anxiety and depression 05/03/2021   Liver mass 05/03/2021   Chest pain in adult 05/24/2018   Morbid obesity ([place]) 05/24/2018   Gastroesophageal reflux disease 05/24/2018   Chronic constipation 05/24/2018   Osteopenia 03/23/2015   S/P bariatric surgery 06/30/2013   Lumbar disc herniation 06/29/2013   Acute lumbar radiculopathy 06/29/2013   Left sided sciatica 05/10/2013    ONSET DATE: January 2023  REFERRING DIAG: H81.10 (ICD-10-CM) - Benign paroxysmal positional vertigo, unspecified laterality  THERAPY DIAG:  No diagnosis found.  SUBJECTIVE:   SUBJECTIVE STATEMENT: "I feel really good. Yesterday was the best day I've had." HEP is getting easier. Reports 2 falls since last session; skinned up the R elbow and knee. Denies head trauma. Feels like she got a little bit of whiplash  because of how bad the neck and shoulders were hurting. Had a bad day on Sunday.   Pt accompanied by: self, husband  PERTINENT HISTORY: From MD note on 03/09/2022: She complains of vertigo and right ear pain. She started experiencing vertigo since January, 2023. She reports feeling like there is something in her right ear. Her dizziness worsens when she has changes in her positions. She reports seeing an emergency room in [place] and was given meclizine and found relief after a week. She was given an MRI while in the emergency room and found no new issues. Her husband reports when her dizziness worsens she started experiencing nystagmus. She has seen an urgent care last week and was given meclizine and prednisone.  Unable to drive   PAIN:  Are you having pain? Yes: NPRS scale: 2/10 Pain location: R ear Pain description: ache Aggravating factors: none Relieving factors: none  PRECAUTIONS: Fall   PATIENT GOALS stop being dizzy.    OBJECTIVE:   TODAY'S TREATMENT: 04/08/22 Activity Comments                      Last updated 04/01/22: Access Code: [email or web address] URL: [email or web address] Date: 04/01/2022 Prepared by: [facility]  Program Notes  make sure you have family and a chair nearby for safety  Exercises - Brandt-Daroff Vestibular Exercise  - 1 x daily - 5 x weekly - 2 sets - 3 reps - Standing Gaze Stabilization with Head Rotation  - 1 x daily - 5 x weekly - 2-3  sets - 30 sec hold - Standing Gaze Stabilization with Head Nod  - 1 x daily - 5 x weekly - 2-3 sets - 30 sec hold - Standing Balance with Eyes Closed  - 1 x daily - 5 x weekly - 3 sets - 30 sec hold - Narrow Stance with Counter Support  - 1 x daily - 5 x weekly - 3 sets - 30 sec hold    Below measures were taken at time of initial evaluation unless otherwise specified   DIAGNOSTIC FINDINGS: had MRI but not available for review  COGNITION: Overall cognitive  status: Within functional limits for tasks assessed   POSTURE: rounded shoulders, forward head, decreased lumbar lordosis, and increased thoracic kyphosis   Cervical ROM:    Active A/PROM (deg) 03/11/2022  Flexion 65  Extension 50  Right lateral flexion   Left lateral flexion   Right rotation 80  Left rotation 80  (Blank rows = not tested)  STRENGTH: 5/5 bil LE strength, 5/5 bil UE strength   GAIT: Gait pattern: step through pattern Distance walked: 50 Assistive device utilized: None Level of assistance: Complete Independence Comments: noted occasional unsteadiness.   PATIENT SURVEYS:  DHI 84% = severe handicap   VESTIBULAR ASSESSMENT   GENERAL OBSERVATION: very slow guarded movements    SYMPTOM BEHAVIOR:   Subjective history: Pt. Reports dizziness "comes in waves" lasting several days, but actual spinning episodes are brief, lasting about a minute.  She reports multiple falls due to dizziness, especially at night getting up from bed.  "I can't live this way."    Non-Vestibular symptoms: nausea/vomiting and ear pain   Type of dizziness: Imbalance (Disequilibrium) and Spinning/Vertigo   Frequency: almost daily, comes in waves     Duration: 1 minute    Aggravating factors: Induced by position change: lying supine, rolling to the left, and supine to sit and Induced by motion: occur when walking, looking up at the ceiling, bending down to the ground, turning body quickly, turning head quickly, driving, and sitting in a moving car   Relieving factors: medication and closing eyes   Progression of symptoms: unchanged   OCULOMOTOR EXAM:   Ocular Alignment: normal   Ocular ROM: No Limitations   Spontaneous Nystagmus: absent   Gaze-Induced Nystagmus: absent   Smooth Pursuits: saccades   Saccades: extra eye movements   Convergence/Divergence: NT cm    VESTIBULAR - OCULAR REFLEX:    Slow VOR: Comment: unable to perform today due to symptoms   VOR Cancellation: Comment:  NA   Head-Impulse Test: HIT Right: NA   Dynamic Visual Acuity: Not able to be assessed    POSITIONAL TESTING: Right Dix-Hallpike: noted nystagmus however patient had to return to sitting due to severe nausea; Duration:NA Right Roll Test: none; Duration: NA Left Roll Test: none; Duration: NA    MOTION SENSITIVITY:    Motion Sensitivity Quotient  Intensity: 0 = none, 1 = Lightheaded, 2 = Mild, 3 = Moderate, 4 = Severe, 5 = Vomiting  Intensity  1. Sitting to supine 4  2. Supine to L side   3. Supine to R side   4. Supine to sitting   5. L Hallpike-Dix   6. Up from L    7. R Hallpike-Dix 4  8. Up from R  4  9. Sitting, head  tipped to L knee   10. Head up from L  knee   11. Sitting, head  tipped to R knee   12. Head up  from R  knee   13. Sitting head turns x5 4  14.Sitting head nods x5 4  15. In stance, 180  turn to L    16. In stance, 180  turn to R     OTHOSTATICS: not done  FUNCTIONAL GAIT:  not done today   VESTIBULAR TREATMENT:  Canalith Repositioning:   Epley Right: Number of Reps: 1, Response to Treatment: comment: increased dizziness, and Comment: was able to tolerate complete maneuver but increased dizziness following.   Gaze Adaptation: NA    Habituation: NA    Other: NA  PATIENT EDUCATION: Education details: education on BPPV, anatomy, plan.  Recommended avoiding large changes in head position today, use walker at night when getting up for bathroom for safety.   Person educated: Patient Education method: Explanation Education comprehension: verbalized understanding   GOALS: Goals reviewed with patient? Yes  SHORT TERM GOALS: Target date: 03/25/2022    1.  Patient will be independent with initial HEP.  Baseline:  none given Goal status: IN PROGRESS   LONG TERM GOALS: Target date: 04/22/2022   Patient will report 75% improvement in dizziness symptoms.   Baseline:  Goal status: IN PROGRESS  2.  Patient to demonstrate 18/24 on DGI,  indicating decreased risk of falls.  Baseline:  Goal status: IN PROGRESS  3.  Patient will report decreased impairment due to dizziness by scoring less than 52% on DHI. Baseline: 84% - severe impairment Goal status: IN PROGRESS  4.  Pt. Will be independent with progressed HEP to improve symptoms and carryover. Baseline:  Goal status: IN PROGRESS  5.  Patient will be able to return to driving safely without limitation from dizziness.  Baseline: unable to drive  Goal status: IN PROGRESS  ASSESSMENT:  CLINICAL IMPRESSION: Patient arrived to session with report of improved dizziness and demonstrating improved stability when walking into clinic today. After discussion with patient and her husband, she admits to 2 falls since last session. Denies head trauma but reports that she suspects a that she had a whiplash. Encouraged patient to use SPC on days when dizziness/imbalance is more of a problem. Patient and husband reported understanding. Patient demonstrated improved tolerance for habituation today. Worked on progressing this with EC and at quicker pace; the latter brought on more severe dizziness and nausea. Able to work on VOR activities in standing with posterior LOB in vertical direction. Updated HEP and discussed max safety with HEP to avoid falls. Patient and husband reported understanding and without complaints at end of session.    OBJECTIVE IMPAIRMENTS Abnormal gait, decreased activity tolerance, decreased balance, decreased mobility, difficulty walking, dizziness, and postural dysfunction.   ACTIVITY LIMITATIONS cleaning, community activity, driving, meal prep, occupation, laundry, yard work, and shopping.   PERSONAL FACTORS Past/current experiences, Time since onset of injury/illness/exacerbation, and 1 comorbidity: history of back pain and surgery  are also affecting patient's functional outcome.    REHAB POTENTIAL: Good  CLINICAL DECISION MAKING:  Stable/uncomplicated  EVALUATION COMPLEXITY: Low   PLAN: PT FREQUENCY: 1-2x/week  PT DURATION: 6 weeks  PLANNED INTERVENTIONS: Therapeutic exercises, Therapeutic activity, Neuromuscular re-education, Balance training, Gait training, Patient/Family education, Joint mobilization, Vestibular training, Canalith repositioning, Manual therapy, and Re-evaluation  PLAN FOR NEXT SESSION: progress VOR and habituation, assess DGI when able to tolerate this assessment  Janene Harvey, PT, DPT 04/07/22 10:39 AM  [facility] [address] [place],  [address] Phone # [phone] Fax # [phone]

## 2022-04-07 NOTE — Assessment & Plan Note (Signed)
New. Requesting lab work. Labs as ordered.  She states she does not snore.

## 2022-04-08 ENCOUNTER — Ambulatory Visit: Payer: 59 | Admitting: Physical Therapy

## 2022-04-09 ENCOUNTER — Telehealth: Payer: Self-pay | Admitting: Family

## 2022-04-09 ENCOUNTER — Encounter: Payer: 59 | Admitting: Physical Therapy

## 2022-04-09 DIAGNOSIS — R7989 Other specified abnormal findings of blood chemistry: Secondary | ICD-10-CM | POA: Insufficient documentation

## 2022-04-09 NOTE — Therapy (Addendum)
OUTPATIENT PHYSICAL THERAPY VESTIBULAR TREATMENT     Patient Name: Melissa Santiago MRN: [id number] DOB:1965/11/29, 56 y.o., female Today's Date: 04/10/2022  PCP: [name], NP REFERRING PROVIDER: [name], NP   PT End of Session - 04/10/22 0847     Visit Number 6    Number of Visits 13    Date for PT Re-Evaluation 04/22/22    Authorization Type [employer]    PT Start Time 0804    PT Stop Time 0846    PT Time Calculation (min) 42 min    Equipment Utilized During Treatment Gait belt    Activity Tolerance Patient tolerated treatment well    Behavior During Therapy [facility] for tasks assessed/performed                  Past Medical History:  Diagnosis Date   Acute lumbar radiculopathy 06/29/2013   Chest pain in adult 05/24/2018   Chronic constipation 05/24/2018   Gastroesophageal reflux disease 05/24/2018   GERD (gastroesophageal reflux disease)    Left sided sciatica 05/10/2013   Lumbar disc herniation 06/29/2013   Morbid obesity ([place]) 05/24/2018   Obesity    Osteopenia 03/23/2015   S/P bariatric surgery 06/30/2013   Past Surgical History:  Procedure Laterality Date   BREAST BIOPSY Left 2014   [place] RESECTION  2014   LUMBAR [place] SURGERY  2014/2016   Patient Active Problem List   Diagnosis Date Noted   Elevated LFTs 04/09/2022   Vestibular neuritis 04/07/2022   Fatigue 04/07/2022   Acute otitis externa of right ear 03/10/2022   Lymphadenopathy 01/27/2022   Erroneous encounter - disregard 11/05/2021   Overactive bladder 06/17/2021   Impacted cerumen of left ear 06/17/2021   Anxiety and depression 05/03/2021   Liver mass 05/03/2021   Chest pain in adult 05/24/2018   Morbid obesity ([place]) 05/24/2018   Gastroesophageal reflux disease 05/24/2018   Chronic constipation 05/24/2018   Osteopenia 03/23/2015   S/P bariatric surgery 06/30/2013   Lumbar disc herniation 06/29/2013   Acute lumbar radiculopathy 06/29/2013   Left sided  sciatica 05/10/2013    ONSET DATE: January 2023  REFERRING DIAG: H81.10 (ICD-10-CM) - Benign paroxysmal positional vertigo, unspecified laterality  THERAPY DIAG:  Dizziness and giddiness  BPPV (benign paroxysmal positional vertigo), right  SUBJECTIVE:   SUBJECTIVE STATEMENT: The exercises are good for the most part and not having to think about it as much. Denies recent falls.   Pt accompanied by: self  PERTINENT HISTORY: From MD note on 03/09/2022: She complains of vertigo and right ear pain. She started experiencing vertigo since January, 2023. She reports feeling like there is something in her right ear. Her dizziness worsens when she has changes in her positions. She reports seeing an emergency room in [place] and was given meclizine and found relief after a week. She was given an MRI while in the emergency room and found no new issues. Her husband reports when her dizziness worsens she started experiencing nystagmus. She has seen an urgent care last week and was given meclizine and prednisone.  Unable to drive   PAIN: *reports no pain but "feels like something is in my ear Are you having pain? Yes: NPRS scale: 0/10 Pain location: R ear Pain description: ache Aggravating factors: none Relieving factors: none  PRECAUTIONS: Fall   PATIENT GOALS stop being dizzy.    OBJECTIVE:   TODAY'S TREATMENT: 04/10/22 Activity Comments  R/L brandt daroff EC 3x each 2/10 dizziness from R, 3/10 dizziness from  L  sitting VOR horizontal/vertical 30" Good speed; c/o head pressure and sway upon stopping; worse with vertical   alt toe tap 2x20 CGA; UE support varying; cues to widen BOS  sitting VOR horizontal/vertical 30"  Moderate-severe sway but able with occasional UE support; cueing to minimize head movement to avoid posterior LOB in vertical direction   march + gaze on target 3x30" CGA with 1 episode of min-mod A; cues to widen BOS; c/o imbalance  Standing wide EC 2x30"  Cueing to shift  weight anteriorly to correct posterior LOB- good carryover  Romberg 2x30" Increased sway and 2 episodes requiring CGA/reaching strategy  Standing ant/pos wt shifts 10x   STS EC 10x CGA-min A    PATIENT EDUCATION: Education details: HEP update Person educated: Patient Education method: Explanation, Demonstration, Tactile cues, Verbal cues, and Handouts Education comprehension: verbalized understanding and returned demonstration  HEP as of 04/10/22: Access Code: [email or web address] URL: [email or web address] Date: 04/10/2022 Prepared by: [facility]  Program Notes  make sure you have family and a chair nearby for safety  Exercises - Brandt-Daroff Vestibular Exercise  - 1 x daily - 5 x weekly - 2 sets - 3 reps - Standing Gaze Stabilization with Head Rotation  - 1 x daily - 5 x weekly - 2-3 sets - 30 sec hold - Standing Gaze Stabilization with Head Nod  - 1 x daily - 5 x weekly - 2-3 sets - 30 sec hold - Standing Balance with Eyes Closed  - 1 x daily - 5 x weekly - 3 sets - 30 sec hold - Narrow Stance with Counter Support  - 1 x daily - 5 x weekly - 3 sets - 30 sec hold - Standing Toe Taps  - 1 x daily - 5 x weekly - 2 sets - 20 reps - Standing March with Counter Support  - 1 x daily - 5 x weekly - 2-3 sets - 30 sec hold    Below measures were taken at time of initial evaluation unless otherwise specified   DIAGNOSTIC FINDINGS: had MRI but not available for review  COGNITION: Overall cognitive status: Within functional limits for tasks assessed   POSTURE: rounded shoulders, forward head, decreased lumbar lordosis, and increased thoracic kyphosis   Cervical ROM:    Active A/PROM (deg) 03/11/2022  Flexion 65  Extension 50  Right lateral flexion   Left lateral flexion   Right rotation 80  Left rotation 80  (Blank rows = not tested)  STRENGTH: 5/5 bil LE strength, 5/5 bil UE strength   GAIT: Gait pattern: step through  pattern Distance walked: 50 Assistive device utilized: None Level of assistance: Complete Independence Comments: noted occasional unsteadiness.   PATIENT SURVEYS:  DHI 84% = severe handicap   VESTIBULAR ASSESSMENT   GENERAL OBSERVATION: very slow guarded movements    SYMPTOM BEHAVIOR:   Subjective history: Pt. Reports dizziness "comes in waves" lasting several days, but actual spinning episodes are brief, lasting about a minute.  She reports multiple falls due to dizziness, especially at night getting up from bed.  "I can't live this way."    Non-Vestibular symptoms: nausea/vomiting and ear pain   Type of dizziness: Imbalance (Disequilibrium) and Spinning/Vertigo   Frequency: almost daily, comes in waves     Duration: 1 minute    Aggravating factors: Induced by position change: lying supine, rolling to the left, and supine to sit and Induced by motion: occur when walking, looking up at  the ceiling, bending down to the ground, turning body quickly, turning head quickly, driving, and sitting in a moving car   Relieving factors: medication and closing eyes   Progression of symptoms: unchanged   OCULOMOTOR EXAM:   Ocular Alignment: normal   Ocular ROM: No Limitations   Spontaneous Nystagmus: absent   Gaze-Induced Nystagmus: absent   Smooth Pursuits: saccades   Saccades: extra eye movements   Convergence/Divergence: NT cm    VESTIBULAR - OCULAR REFLEX:    Slow VOR: Comment: unable to perform today due to symptoms   VOR Cancellation: Comment: NA   Head-Impulse Test: HIT Right: NA   Dynamic Visual Acuity: Not able to be assessed    POSITIONAL TESTING: Right Dix-Hallpike: noted nystagmus however patient had to return to sitting due to severe nausea; Duration:NA Right Roll Test: none; Duration: NA Left Roll Test: none; Duration: NA    MOTION SENSITIVITY:    Motion Sensitivity Quotient  Intensity: 0 = none, 1 = Lightheaded, 2 = Mild, 3 = Moderate, 4 = Severe, 5 = Vomiting   Intensity  1. Sitting to supine 4  2. Supine to L side   3. Supine to R side   4. Supine to sitting   5. L Hallpike-Dix   6. Up from L    7. R Hallpike-Dix 4  8. Up from R  4  9. Sitting, head  tipped to L knee   10. Head up from L  knee   11. Sitting, head  tipped to R knee   12. Head up from R  knee   13. Sitting head turns x5 4  14.Sitting head nods x5 4  15. In stance, 180  turn to L    16. In stance, 180  turn to R     OTHOSTATICS: not done  FUNCTIONAL GAIT:  not done today   VESTIBULAR TREATMENT:  Canalith Repositioning:   Epley Right: Number of Reps: 1, Response to Treatment: comment: increased dizziness, and Comment: was able to tolerate complete maneuver but increased dizziness following.   Gaze Adaptation: NA    Habituation: NA    Other: NA  PATIENT EDUCATION: Education details: education on BPPV, anatomy, plan.  Recommended avoiding large changes in head position today, use walker at night when getting up for bathroom for safety.   Person educated: Patient Education method: Explanation Education comprehension: verbalized understanding   GOALS: Goals reviewed with patient? Yes  SHORT TERM GOALS: Target date: 03/25/2022    1.  Patient will be independent with initial HEP.  Baseline:  none given Goal status: IN PROGRESS   LONG TERM GOALS: Target date: 04/22/2022   Patient will report 75% improvement in dizziness symptoms.   Baseline:  Goal status: IN PROGRESS  2.  Patient to demonstrate 18/24 on DGI, indicating decreased risk of falls.  Baseline:  Goal status: IN PROGRESS  3.  Patient will report decreased impairment due to dizziness by scoring less than 52% on DHI. Baseline: 84% - severe impairment Goal status: IN PROGRESS  4.  Pt. Will be independent with progressed HEP to improve symptoms and carryover. Baseline:  Goal status: IN PROGRESS  5.  Patient will be able to return to driving safely without limitation from dizziness.   Baseline: unable to drive  Goal status: IN PROGRESS  ASSESSMENT:  CLINICAL IMPRESSION: Patient arrived to session with report of improving tolerance for HEP. Reviewed habituation with EC which revealed less sway upon sitting up. Demonstrated good gaze stability and  speed with VOR activities today and able to wean UE support with standing VOR activities. Patient still with significant posterior and R sway with standing balance activities but with excellent focus and use of cueing to correct. HEP was updated with exercises that were well-tolerated today. Patient without complaints at end of session.    OBJECTIVE IMPAIRMENTS Abnormal gait, decreased activity tolerance, decreased balance, decreased mobility, difficulty walking, dizziness, and postural dysfunction.   ACTIVITY LIMITATIONS cleaning, community activity, driving, meal prep, occupation, laundry, yard work, and shopping.   PERSONAL FACTORS Past/current experiences, Time since onset of injury/illness/exacerbation, and 1 comorbidity: history of back pain and surgery  are also affecting patient's functional outcome.    REHAB POTENTIAL: Good  CLINICAL DECISION MAKING: Stable/uncomplicated  EVALUATION COMPLEXITY: Low   PLAN: PT FREQUENCY: 1-2x/week  PT DURATION: 6 weeks  PLANNED INTERVENTIONS: Therapeutic exercises, Therapeutic activity, Neuromuscular re-education, Balance training, Gait training, Patient/Family education, Joint mobilization, Vestibular training, Canalith repositioning, Manual therapy, and Re-evaluation  PLAN FOR NEXT SESSION: progress VOR and habituation, assess DGI when able to tolerate this assessment  Janene [place], PT, DPT 04/10/22 8:48 AM  [facility] at [facility] [address], [place], [place] [address] Phone # [phone] Fax # [phone]   PHYSICAL THERAPY DISCHARGE SUMMARY  Visits from Start of Care: 6  Current functional level related to  goals / functional outcomes: Unable to assess; patient discharged d/t no-show policy    Remaining deficits: Unable to assess   Education / Equipment: HEP  Plan: Patient agrees to discharge.  Patient goals were not met. Patient is being discharged due to no-show policy.    Janene [place], PT, DPT 06/16/22 12:03 PM  [facility] at [facility] [address] [place], [place],  [address] Phone # [phone] Fax # [phone]

## 2022-04-09 NOTE — Telephone Encounter (Signed)
Liver testing is up a little. Not sure why, Let's repeat lft's in 1 month, dx elevated lft's. tks

## 2022-04-10 ENCOUNTER — Encounter: Payer: Self-pay | Admitting: Physical Therapy

## 2022-04-10 ENCOUNTER — Ambulatory Visit: Payer: 59 | Admitting: Physical Therapy

## 2022-04-10 DIAGNOSIS — H8111 Benign paroxysmal vertigo, right ear: Secondary | ICD-10-CM

## 2022-04-10 DIAGNOSIS — R42 Dizziness and giddiness: Secondary | ICD-10-CM | POA: Diagnosis not present

## 2022-04-10 NOTE — Telephone Encounter (Signed)
Called [facility] no answer, lvm for patient to call back for results

## 2022-04-15 ENCOUNTER — Ambulatory Visit: Payer: 59 | Admitting: Physical Therapy

## 2022-04-15 NOTE — Telephone Encounter (Signed)
Lvm again this morning, mychart message sent

## 2022-04-16 ENCOUNTER — Encounter: Payer: 59 | Admitting: Physical Therapy

## 2022-04-17 ENCOUNTER — Ambulatory Visit: Payer: 59 | Admitting: Physical Therapy

## 2022-04-21 ENCOUNTER — Other Ambulatory Visit: Payer: Self-pay

## 2022-04-21 ENCOUNTER — Telehealth: Payer: Self-pay

## 2022-04-21 DIAGNOSIS — R7989 Other specified abnormal findings of blood chemistry: Secondary | ICD-10-CM

## 2022-04-21 NOTE — Telephone Encounter (Signed)
Catelyn Kaszuba Caller Phone Number [phone] Patient Name Melissa Santiago Patient DOB 03/07/1956 Call Type Message Only Information Provided Reason for Call Returning a Call from the Office Initial Comment Caller states she is returning Ruth's call

## 2022-04-22 ENCOUNTER — Ambulatory Visit: Payer: 59 | Admitting: Physical Therapy

## 2022-04-24 ENCOUNTER — Ambulatory Visit: Payer: 59 | Admitting: Physical Therapy

## 2022-05-07 ENCOUNTER — Telehealth: Payer: Self-pay | Admitting: Family

## 2022-05-07 ENCOUNTER — Other Ambulatory Visit (INDEPENDENT_AMBULATORY_CARE_PROVIDER_SITE_OTHER): Payer: 59

## 2022-05-07 DIAGNOSIS — R7989 Other specified abnormal findings of blood chemistry: Secondary | ICD-10-CM | POA: Diagnosis not present

## 2022-05-07 LAB — HEPATIC FUNCTION PANEL
ALT: 20 U/L (ref 0–35)
AST: 25 U/L (ref 0–37)
Albumin: 4.1 g/dL (ref 3.5–5.2)
Alkaline Phosphatase: 90 U/L (ref 39–117)
Bilirubin, Direct: 0.1 mg/dL (ref 0.0–0.3)
Total Bilirubin: 0.5 mg/dL (ref 0.2–1.2)
Total Protein: 6.2 g/dL (ref 6.0–8.3)

## 2022-05-07 NOTE — Telephone Encounter (Signed)
See mychart.  

## 2022-06-22 ENCOUNTER — Ambulatory Visit
Admission: EM | Admit: 2022-06-22 | Discharge: 2022-06-22 | Disposition: A | Discharge status: Home / Self Care | Payer: 59 | Attending: Emergency Medicine | Admitting: Emergency Medicine

## 2022-06-22 DIAGNOSIS — L03116 Cellulitis of left lower limb: Secondary | ICD-10-CM

## 2022-06-22 DIAGNOSIS — L301 Dyshidrosis [pompholyx]: Secondary | ICD-10-CM | POA: Diagnosis not present

## 2022-06-22 MED ORDER — SULFAMETHOXAZOLE-TRIMETHOPRIM 800-160 MG PO TABS: 1.0000 | ORAL_TABLET | Freq: Two times a day (BID) | ORAL | 0 refills | Status: AC

## 2022-06-22 MED ORDER — BETAMETHASONE DIPROPIONATE AUG 0.05 % EX OINT: TOPICAL_OINTMENT | Freq: Two times a day (BID) | CUTANEOUS | 2 refills | Status: DC

## 2022-06-22 NOTE — ED Triage Notes (Addendum)
The patient states she has a lesion to the bottom of her foot that is not healing. The patient states at night she unintentionally scratches the bottom of her left foot with the right toe nails. She has about 3 open wounds to the foot, no drainage noted. Started: 3 weeks ago

## 2022-06-22 NOTE — Discharge Instructions (Signed)
I have enclosed some information about dyshidrotic eczema that I hope you find helpful.  For treatment, I recommend that you begin applying betamethasone ointment to the sole of your foot twice daily followed by a moisture barrier such as Eucerin original healing cream.  I provided you with a prescription of this product so you can find it at the pharmacy.  Once the itching is resolved and the lesions are gone, you can discontinue use of the steroid but I encourage you to consider using the Eucerin cream nightly to prevent future recurrences.  I also recommend that you begin taking Bactrim twice daily for the next 5 days for possible bacterial superinfection due to trauma caused by deep scratching.  Please let us know if you are not doing any better after 5 days of treatment.  We are happy to see you again for repeat evaluation.  Thank you for visiting urgent care today.

## 2022-06-22 NOTE — ED Provider Notes (Signed)
[facility]    CSN: [id number] Arrival date & time: 06/22/22  1047    HISTORY   Chief Complaint  Patient presents with   foot lesion   HPI Melissa Santiago is a pleasant, 56 y.o. female who presents to urgent care today. Patient complains of a itchy rash on the bottom of her left foot that will not heal.  Patient states she has had the rash many times in the past but this is the worst the itching has been.  Patient states that she believes that she has been scratching the bottom of her left foot with her right toenails in her sleep and now feels areas where she has dug into her skin and is concerned that they might be infected.  Patient states they continue to itch despite this.  Patient denies difficulty bearing weight on her left foot.  Patient states she has not tried any interventions for her symptoms except wearing socks to bed which she states she removes when she is asleep.  The history is provided by the patient.   Past Medical History:  Diagnosis Date   Acute lumbar radiculopathy 06/29/2013   Chest pain in adult 05/24/2018   Chronic constipation 05/24/2018   Gastroesophageal reflux disease 05/24/2018   GERD (gastroesophageal reflux disease)    Left sided sciatica 05/10/2013   Lumbar disc herniation 06/29/2013   Morbid obesity ([place]) 05/24/2018   Obesity    Osteopenia 03/23/2015   S/P bariatric surgery 06/30/2013   Patient Active Problem List   Diagnosis Date Noted   Elevated LFTs 04/09/2022   Vestibular neuritis 04/07/2022   Fatigue 04/07/2022   Acute otitis externa of right ear 03/10/2022   Lymphadenopathy 01/27/2022   Erroneous encounter - disregard 11/05/2021   Overactive bladder 06/17/2021   Impacted cerumen of left ear 06/17/2021   Anxiety and depression 05/03/2021   Liver mass 05/03/2021   Chest pain in adult 05/24/2018   Morbid obesity ([place]) 05/24/2018   Gastroesophageal reflux disease 05/24/2018   Chronic constipation 05/24/2018   Osteopenia 03/23/2015    S/P bariatric surgery 06/30/2013   Lumbar disc herniation 06/29/2013   Acute lumbar radiculopathy 06/29/2013   Left sided sciatica 05/10/2013   Past Surgical History:  Procedure Laterality Date   BREAST BIOPSY Left 2014   [place] RESECTION  2014   LUMBAR [place] SURGERY  2014/2016   OB History   No obstetric history on file.    Home Medications    Prior to Admission medications   Medication Sig Start Date End Date Taking? Authorizing Provider  DULoxetine (CYMBALTA) 60 MG capsule TAKE 1 CAPSULE BY MOUTH EVERY DAY 02/17/22   [name], NP  omeprazole (PRILOSEC) 40 MG capsule Take 1 capsule (40 mg total) by mouth in the morning and at bedtime. 01/17/21   [name], NP    Family History Family History  Problem Relation Age of Onset   Arthritis Mother    Early death Mother    Mental illness Mother    Alcohol abuse Father    Arthritis Father    Early death Sister    Thyroid cancer Sister    Hypertension Sister    Heart attack Maternal Grandfather    Uterine cancer Sister    Hypertension Sister    Cervical cancer Sister    Depression Sister    Hypertension Sister    Colon cancer Neg Hx    Esophageal cancer Neg Hx    Breast cancer Neg Hx  Rectal cancer Neg Hx    Stomach cancer Neg Hx    Social History Social History   Tobacco Use   Smoking status: Former    Packs/day: 0.50    Years: 15.00    Total pack years: 7.50    Types: Cigarettes    Quit date: 2006    Years since quitting: 17.6   Smokeless tobacco: Never  Vaping Use   Vaping Use: Never used  Substance Use Topics   Alcohol use: Yes    Comment: rarely   Drug use: Never   Allergies   Tolmetin  Review of Systems Review of Systems Pertinent findings revealed after performing a 14 point review of systems has been noted in the history of present illness.  Physical Exam Triage Vital Signs ED Triage Vitals  Enc Vitals Group     BP 08/23/21 0827 (!) 147/82      Pulse Rate 08/23/21 0827 72     Resp 08/23/21 0827 18     Temp 08/23/21 0827 98.3 F (36.8 C)     Temp Source 08/23/21 0827 Oral     SpO2 08/23/21 0827 98 %     Weight --      Height --      Head Circumference --      Peak Flow --      Pain Score 08/23/21 0826 5     Pain Loc --      Pain Edu? --      Excl. in [place]? --   No data found.  Updated Vital Signs BP 135/83 (BP Location: Left Arm)   Pulse (!) 104   Temp 98.1 F (36.7 C) (Oral)   Resp 18   SpO2 96%   Physical Exam Vitals and nursing note reviewed.  Constitutional:      General: She is not in acute distress.    Appearance: Normal appearance.  HENT:     Head: Normocephalic and atraumatic.  Eyes:     Pupils: Pupils are equal, round, and reactive to light.  Cardiovascular:     Rate and Rhythm: Normal rate and regular rhythm.  Pulmonary:     Effort: Pulmonary effort is normal.     Breath sounds: Normal breath sounds.  Musculoskeletal:        General: Normal range of motion.     Cervical back: Normal range of motion and neck supple.  Skin:    General: Skin is warm and dry.     Findings: Rash (Rash concerning for dyshidrotic eczema with extensive excoriation, surrounding erythema and tenderness to palpation.) present.  Neurological:     General: No focal deficit present.     Mental Status: She is alert and oriented to person, place, and time. Mental status is at baseline.  Psychiatric:        Mood and Affect: Mood normal.        Behavior: Behavior normal.        Thought Content: Thought content normal.        Judgment: Judgment normal.     Visual Acuity Right Eye Distance:   Left Eye Distance:   Bilateral Distance:    Right Eye Near:   Left Eye Near:    Bilateral Near:     UC Couse / Diagnostics / Procedures:     Radiology No results found.  Procedures Procedures (including critical care time) EKG  Pending results:  Labs Reviewed - No data to display  Medications Ordered in UC: Medications -  No data to display  UC Diagnoses / Final Clinical Impressions(s)   I have reviewed the triage vital signs and the nursing notes.  Pertinent labs & imaging results that were available during my care of the patient were reviewed by me and considered in my medical decision making (see chart for details).    Final diagnoses:  Dyshidrotic eczema  Cellulitis of left lower extremity   Patient provided with betamethasone to be used twice daily along with Eucerin cream as a moisture barrier.  Patient also advised that she may wish to wrap her foot in Saran wrap after applying the mometasone at nighttime so that she cannot rub it off.  Patient provided with Bactrim for likely cellulitis due to trauma caused from scratching with her toenail.  Patient advised to trim her nails very short to prevent this from happening again.  Return precautions advised.  ED Prescriptions     Medication Sig Dispense Auth. Provider   sulfamethoxazole-trimethoprim (BACTRIM DS) 800-160 MG tablet Take 1 tablet by mouth 2 (two) times daily for 5 days. 10 tablet Lynden Oxford Scales, PA-C   augmented betamethasone dipropionate (DIPROLENE-AF) 0.05 % ointment Apply topically 2 (two) times daily. 50 g Lynden Oxford Scales, PA-C      PDMP not reviewed this encounter.  Pending results:  Labs Reviewed - No data to display  Discharge Instructions:   Discharge Instructions      I have enclosed some information about dyshidrotic eczema that I hope you find helpful.  For treatment, I recommend that you begin applying betamethasone ointment to the sole of your foot twice daily followed by a moisture barrier such as Eucerin original healing cream.  I provided you with a prescription of this product so you can find it at the pharmacy.  Once the itching is resolved and the lesions are gone, you can discontinue use of the steroid but I encourage you to consider using the Eucerin cream nightly to prevent future recurrences.  I  also recommend that you begin taking Bactrim twice daily for the next 5 days for possible bacterial superinfection due to trauma caused by deep scratching.  Please let us know if you are not doing any better after 5 days of treatment.  We are happy to see you again for repeat evaluation.  Thank you for visiting urgent care today.      Disposition Upon Discharge:  Condition: stable for discharge home  Patient presented with an acute illness with associated systemic symptoms and significant discomfort requiring urgent management. In my opinion, this is a condition that a prudent lay person (someone who possesses an average knowledge of health and medicine) may potentially expect to result in complications if not addressed urgently such as respiratory distress, impairment of bodily function or dysfunction of bodily organs.   Routine symptom specific, illness specific and/or disease specific instructions were discussed with the patient and/or caregiver at length.   As such, the patient has been evaluated and assessed, work-up was performed and treatment was provided in alignment with urgent care protocols and evidence based medicine.  Patient/parent/caregiver has been advised that the patient may require follow up for further testing and treatment if the symptoms continue in spite of treatment, as clinically indicated and appropriate.  Patient/parent/caregiver has been advised to return to the [facility] or PCP if no better; to PCP or the Emergency Department if new signs and symptoms develop, or if the current signs or symptoms continue to change or worsen for further workup, evaluation  and treatment as clinically indicated and appropriate  The patient will follow up with their current PCP if and as advised. If the patient does not currently have a PCP we will assist them in obtaining one.   The patient may need specialty follow up if the symptoms continue, in spite of conservative treatment and  management, for further workup, evaluation, consultation and treatment as clinically indicated and appropriate.   Patient/parent/caregiver verbalized understanding and agreement of plan as discussed.  All questions were addressed during visit.  Please see discharge instructions below for further details of plan.  This office note has been dictated using Museum/gallery curator.  Unfortunately, this method of dictation can sometimes lead to typographical or grammatical errors.  I apologize for your inconvenience in advance if this occurs.  Please do not hesitate to reach out to me if clarification is needed.      Lynden Oxford Scales, [place] 06/22/22 [phone]

## 2022-06-24 ENCOUNTER — Emergency Department (HOSPITAL_BASED_OUTPATIENT_CLINIC_OR_DEPARTMENT_OTHER)
Admission: EM | Admit: 2022-06-24 | Discharge: 2022-06-25 | Disposition: A | Discharge status: Home / Self Care | Payer: 59 | Attending: Emergency Medicine | Admitting: Emergency Medicine

## 2022-06-24 ENCOUNTER — Other Ambulatory Visit: Payer: Self-pay

## 2022-06-24 ENCOUNTER — Emergency Department (HOSPITAL_BASED_OUTPATIENT_CLINIC_OR_DEPARTMENT_OTHER): Payer: 59

## 2022-06-24 ENCOUNTER — Encounter (HOSPITAL_BASED_OUTPATIENT_CLINIC_OR_DEPARTMENT_OTHER): Payer: Self-pay | Admitting: Emergency Medicine

## 2022-06-24 DIAGNOSIS — R1013 Epigastric pain: Secondary | ICD-10-CM | POA: Diagnosis not present

## 2022-06-24 DIAGNOSIS — R0789 Other chest pain: Secondary | ICD-10-CM | POA: Diagnosis present

## 2022-06-24 DIAGNOSIS — Z79899 Other long term (current) drug therapy: Secondary | ICD-10-CM | POA: Diagnosis not present

## 2022-06-24 DIAGNOSIS — R000 Tachycardia, unspecified: Secondary | ICD-10-CM | POA: Diagnosis not present

## 2022-06-24 DIAGNOSIS — Z87891 Personal history of nicotine dependence: Secondary | ICD-10-CM | POA: Diagnosis not present

## 2022-06-24 DIAGNOSIS — Z20822 Contact with and (suspected) exposure to covid-19: Secondary | ICD-10-CM | POA: Diagnosis not present

## 2022-06-24 LAB — COMPREHENSIVE METABOLIC PANEL
ALT: 70 U/L — ABNORMAL HIGH (ref 0–44)
AST: 79 U/L — ABNORMAL HIGH (ref 15–41)
Albumin: 3.6 g/dL (ref 3.5–5.0)
Alkaline Phosphatase: 107 U/L (ref 38–126)
Anion gap: 7 (ref 5–15)
BUN: 14 mg/dL (ref 6–20)
CO2: 24 mmol/L (ref 22–32)
Calcium: 8.5 mg/dL — ABNORMAL LOW (ref 8.9–10.3)
Chloride: 103 mmol/L (ref 98–111)
Creatinine, Ser: 0.73 mg/dL (ref 0.44–1.00)
GFR, Estimated: >60 mL/min (ref >60)
Glucose, Bld: 103 mg/dL — ABNORMAL HIGH (ref 70–99)
Potassium: 3.8 mmol/L (ref 3.5–5.1)
Sodium: 134 mmol/L — ABNORMAL LOW (ref 135–145)
Total Bilirubin: 0.4 mg/dL (ref 0.3–1.2)
Total Protein: 6.7 g/dL (ref 6.5–8.1)

## 2022-06-24 LAB — CBC
HCT: 40.5 % (ref 36.0–46.0)
Hemoglobin: 13.8 g/dL (ref 12.0–15.0)
MCH: 29.6 pg (ref 26.0–34.0)
MCHC: 34.1 g/dL (ref 30.0–36.0)
MCV: 86.9 fL (ref 80.0–100.0)
Platelets: 198 10*3/uL (ref 150–400)
RBC: 4.66 MIL/uL (ref 3.87–5.11)
RDW: 13.8 % (ref 11.5–15.5)
WBC: 6.6 10*3/uL (ref 4.0–10.5)
nRBC: 0 % (ref 0.0–0.2)

## 2022-06-24 LAB — TROPONIN I (HIGH SENSITIVITY): Troponin I (High Sensitivity): 3 ng/L (ref <18)

## 2022-06-24 LAB — D-DIMER, QUANTITATIVE: D-Dimer, Quant: 0.37 ug/mL-FEU (ref 0.00–0.50)

## 2022-06-24 LAB — TSH: TSH: 3.42 u[IU]/mL (ref 0.350–4.500)

## 2022-06-24 LAB — RESP PANEL BY RT-PCR (FLU A&B, COVID) ARPGX2
Influenza A by PCR: NEGATIVE
Influenza B by PCR: NEGATIVE
SARS Coronavirus 2 by RT PCR: NEGATIVE

## 2022-06-24 LAB — LIPASE, BLOOD: Lipase: 27 U/L (ref 11–51)

## 2022-06-24 MED ORDER — HYDROMORPHONE HCL 1 MG/ML IJ SOLN
0.5000 mg | Freq: Once | INTRAMUSCULAR | Status: AC
  Administered 2022-06-24: 0.5 mg via INTRAVENOUS
  Filled 2022-06-24: qty 1

## 2022-06-24 MED ORDER — IOHEXOL 300 MG/ML  SOLN
100.0000 mL | Freq: Once | INTRAMUSCULAR | Status: AC | PRN
  Administered 2022-06-24: 100 mL via INTRAVENOUS

## 2022-06-24 MED ORDER — LIDOCAINE VISCOUS HCL 2 % MT SOLN
15.0000 mL | Freq: Once | OROMUCOSAL | Status: AC
  Administered 2022-06-24: 15 mL via ORAL
  Filled 2022-06-24: qty 15

## 2022-06-24 MED ORDER — ONDANSETRON HCL 4 MG PO TABS: 4.0000 mg | ORAL_TABLET | ORAL | 0 refills | Status: DC | PRN

## 2022-06-24 MED ORDER — ALUM & MAG HYDROXIDE-SIMETH 200-200-20 MG/5ML PO SUSP
30.0000 mL | Freq: Once | ORAL | Status: AC
  Administered 2022-06-24: 30 mL via ORAL
  Filled 2022-06-24: qty 30

## 2022-06-24 MED ORDER — SODIUM CHLORIDE 0.9 % IV BOLUS
1000.0000 mL | Freq: Once | INTRAVENOUS | Status: AC
Start: 2022-06-24 — End: 2022-06-24
  Administered 2022-06-24: 1000 mL via INTRAVENOUS

## 2022-06-24 MED ORDER — SUCRALFATE 1 G PO TABS: 1.0000 g | ORAL_TABLET | Freq: Three times a day (TID) | ORAL | 0 refills | Status: DC

## 2022-06-24 MED ORDER — ONDANSETRON HCL 4 MG/2ML IJ SOLN
4.0000 mg | Freq: Once | INTRAMUSCULAR | Status: AC
  Administered 2022-06-24: 4 mg via INTRAVENOUS
  Filled 2022-06-24: qty 2

## 2022-06-24 MED ORDER — FAMOTIDINE IN NACL 20-0.9 MG/50ML-% IV SOLN
20.0000 mg | Freq: Once | INTRAVENOUS | Status: AC
  Administered 2022-06-24: 20 mg via INTRAVENOUS
  Filled 2022-06-24: qty 50

## 2022-06-24 NOTE — Discharge Instructions (Addendum)
It was a pleasure caring for you today in the emergency department. ° °Please return to the emergency department for any worsening or worrisome symptoms. ° ° °

## 2022-06-24 NOTE — ED Provider Notes (Incomplete)
[facility] Provider Note   CSN: [id number] Arrival date & time: 06/24/22  2059     History {Add pertinent medical, surgical, social history, OB history to HPI:1} Chief Complaint  Patient presents with   Chest Pain    CIERRA Santiago is a 56 y.o. female.  Patient as above with significant medical history as below, including obesity, sciatica, GERD, chronic constipation, chronic chest pain who presents to the ED with complaint of multiple plaints.  Patient reporting chest pain over the past 3 days, described as a tightness, squeezing sensation to her anterior chest wall with radiation to her back.  Pain was gradual in onset, has been intermittent since the onset.  Has been occurring intermittently over the past 3 days.  Not described as ripping or tearing.  Not associate with numbness or tingling.  No significant dyspnea.  She is having some mild nausea, possibly having a low-grade fever at home.  No medications prior to arrival.  Reports elevated anxiety.  Change in bowel or bladder function.  No recent medication or diet changes.  No recent stimulant use.  No history of DVT or PE.  No rashes.     Past Medical History:  Diagnosis Date   Acute lumbar radiculopathy 06/29/2013   Chest pain in adult 05/24/2018   Chronic constipation 05/24/2018   Gastroesophageal reflux disease 05/24/2018   GERD (gastroesophageal reflux disease)    Left sided sciatica 05/10/2013   Lumbar disc herniation 06/29/2013   Morbid obesity ([place]) 05/24/2018   Obesity    Osteopenia 03/23/2015   S/P bariatric surgery 06/30/2013    Past Surgical History:  Procedure Laterality Date   BREAST BIOPSY Left 2014   [place] RESECTION  2014   LUMBAR [place] SURGERY  2014/2016     The history is provided by the patient. No language interpreter was used.  Chest Pain Associated symptoms: diaphoresis, fever and nausea   Associated symptoms: no abdominal pain, no back pain, no cough,  no dysphagia, no headache, no palpitations and no shortness of breath        Home Medications Prior to Admission medications   Medication Sig Start Date End Date Taking? Authorizing Provider  augmented betamethasone dipropionate (DIPROLENE-AF) 0.05 % ointment Apply topically 2 (two) times daily. 06/22/22   Lynden Oxford Scales, PA-C  DULoxetine (CYMBALTA) 60 MG capsule TAKE 1 CAPSULE BY MOUTH EVERY DAY 02/17/22   [name], NP  omeprazole (PRILOSEC) 40 MG capsule Take 1 capsule (40 mg total) by mouth in the morning and at bedtime. 01/17/21   [name], NP  sulfamethoxazole-trimethoprim (BACTRIM DS) 800-160 MG tablet Take 1 tablet by mouth 2 (two) times daily for 5 days. 06/22/22 06/27/22  Lynden Oxford Scales, PA-C      Allergies    Tolmetin    Review of Systems   Review of Systems  Constitutional:  Positive for diaphoresis and fever. Negative for activity change.  HENT:  Negative for facial swelling and trouble swallowing.   Eyes:  Negative for discharge and redness.  Respiratory:  Negative for cough and shortness of breath.   Cardiovascular:  Positive for chest pain. Negative for palpitations.  Gastrointestinal:  Positive for nausea. Negative for abdominal pain.  Genitourinary:  Negative for dysuria and flank pain.  Musculoskeletal:  Negative for back pain and gait problem.  Skin:  Negative for pallor and rash.  Neurological:  Negative for syncope and headaches.  Psychiatric/Behavioral:  The patient is nervous/anxious.  Physical Exam Updated Vital Signs BP 116/75   Pulse 95   Temp 98 F (36.7 C)   Resp 15   Ht '5\' 5"'$  (1.651 m)   Wt 95.7 kg   SpO2 99%   BMI 35.11 kg/m  Physical Exam Vitals and nursing note reviewed.  Constitutional:      General: She is not in acute distress.    Appearance: Normal appearance. She is well-developed. She is obese. She is diaphoretic. She is not ill-appearing.  HENT:     Head: Normocephalic and atraumatic. No  raccoon eyes, Battle's sign, right periorbital erythema or left periorbital erythema.     Jaw: There is normal jaw occlusion.     Right Ear: External ear normal.     Left Ear: External ear normal.     Nose: Nose normal.     Mouth/Throat:     Mouth: Mucous membranes are moist.  Eyes:     General: No scleral icterus.       Right eye: No discharge.        Left eye: No discharge.     Extraocular Movements: Extraocular movements intact.     Pupils: Pupils are equal, round, and reactive to light.  Cardiovascular:     Rate and Rhythm: Regular rhythm. Tachycardia present.     Pulses: Normal pulses.          Radial pulses are 2+ on the right side and 2+ on the left side.       Dorsalis pedis pulses are 2+ on the right side and 2+ on the left side.     Heart sounds: Normal heart sounds. No murmur heard.    No S3 or S4 sounds.  Pulmonary:     Effort: Pulmonary effort is normal. No tachypnea or respiratory distress.     Breath sounds: Normal breath sounds. No decreased breath sounds or wheezing.  Abdominal:     General: Abdomen is flat.     Palpations: Abdomen is soft.     Tenderness: There is no abdominal tenderness. There is no guarding or rebound.  Musculoskeletal:        General: Normal range of motion.     Cervical back: Full passive range of motion without pain and normal range of motion.     Right lower leg: No edema.     Left lower leg: No edema.  Skin:    General: Skin is warm.     Capillary Refill: Capillary refill takes less than 2 seconds.  Neurological:     Mental Status: She is alert and oriented to person, place, and time.     GCS: GCS eye subscore is 4. GCS verbal subscore is 5. GCS motor subscore is 6.     Cranial Nerves: Cranial nerves 2-12 are intact. No dysarthria.     Sensory: Sensation is intact.     Motor: Motor function is intact. No weakness or tremor.     Coordination: Coordination is intact.  Psychiatric:        Mood and Affect: Mood is anxious.         Behavior: Behavior normal.     Comments: Hyperverbal     ED Results / Procedures / Treatments   Labs (all labs ordered are listed, but only abnormal results are displayed) Labs Reviewed  COMPREHENSIVE METABOLIC PANEL - Abnormal; Notable for the following components:      Result Value   Sodium 134 (*)    Glucose, Bld 103 (*)  Calcium 8.5 (*)    AST 79 (*)    ALT 70 (*)    All other components within normal limits  RESP PANEL BY RT-PCR (FLU A&B, COVID) ARPGX2  CBC  D-DIMER, QUANTITATIVE  LIPASE, BLOOD  PREGNANCY, URINE  TSH  RAPID URINE DRUG SCREEN, HOSP PERFORMED  URINALYSIS, ROUTINE W REFLEX MICROSCOPIC  TROPONIN I (HIGH SENSITIVITY)  TROPONIN I (HIGH SENSITIVITY)    EKG None  Radiology DG Chest 2 View  Result Date: 06/24/2022 CLINICAL DATA:  Chest pain EXAM: CHEST - 2 VIEW COMPARISON:  10/08/2019 FINDINGS: Lungs are clear.  No pleural effusion or pneumothorax. The heart is normal in size. Visualized osseous structures are within normal limits. IMPRESSION: Normal chest radiographs. Electronically Signed   By: Julian Hy M.D.   On: 06/24/2022 21:30    Procedures Procedures  {Document cardiac monitor, telemetry assessment procedure when appropriate:1}  Medications Ordered in ED Medications  sodium chloride 0.9 % bolus 1,000 mL (1,000 mLs Intravenous New Bag/Given 06/24/22 2153)  ondansetron (ZOFRAN) injection 4 mg (4 mg Intravenous Given 06/24/22 2150)  famotidine (PEPCID) IVPB 20 mg premix (0 mg Intravenous Stopped 06/24/22 2230)  HYDROmorphone (DILAUDID) injection 0.5 mg (0.5 mg Intravenous Given 06/24/22 2157)    ED Course/ Medical Decision Making/ A&P                           Medical Decision Making Amount and/or Complexity of Data Reviewed Labs: ordered. Radiology: ordered.  Risk Prescription drug management.   This patient presents to the ED with chief complaint(s) of cp, nausea, diaphoresis with pertinent past medical history of above which  further complicates the presenting complaint. The complaint involves an extensive differential diagnosis and also carries with it a high risk of complications and morbidity.    Differential includes all life-threatening causes for chest pain. This includes but is not exclusive to acute coronary syndrome, aortic dissection, pulmonary embolism, cardiac tamponade, community-acquired pneumonia, pericarditis, musculoskeletal chest wall pain, etc.  . Serious etiologies were considered.   The initial plan is to screening labs, imaging, analgesics, ivf   Additional history obtained: Additional history obtained from  na Records reviewed Primary Care Documents prior labs and imaging AST 49, ALT 49 2 months ago >> AST today 79, ALT is 70.  Independent labs interpretation:  The following labs were independently interpreted:    Independent visualization of imaging: - I independently visualized the following imaging with scope of interpretation limited to determining acute life threatening conditions related to emergency care: chest X-ray,, which revealed acute process  Cardiac monitoring was reviewed and interpreted by myself which shows initially sinus tachy, after brief period of time she is in NSR  Treatment and Reassessment: IV fluids, analgesia, antiemetic  Consultation: - Consulted or discussed management/test interpretation w/ external professional: ***  Consideration for admission or further workup: Admission was considered ***  Social Determinants of health: Social History   Tobacco Use   Smoking status: Former    Packs/day: 0.50    Years: 15.00    Total pack years: 7.50    Types: Cigarettes    Quit date: 2006    Years since quitting: 17.6   Smokeless tobacco: Never  Vaping Use   Vaping Use: Never used  Substance Use Topics   Alcohol use: Yes    Comment: rarely   Drug use: Never      {Document critical care time when appropriate:1} {Document review of labs and  clinical  decision tools ie heart score, Chads2Vasc2 etc:1}  {Document your independent review of radiology images, and any outside records:1} {Document your discussion with family members, caretakers, and with consultants:1} {Document social determinants of health affecting pt's care:1} {Document your decision making why or why not admission, treatments were needed:1} Final Clinical Impression(s) / ED Diagnoses Final diagnoses:  None    Rx / DC Orders ED Discharge Orders     None

## 2022-06-24 NOTE — ED Triage Notes (Signed)
Patient arrived via POV c/o chest pain starting last night, getting worse today. Patient states pain in right/eft chest under breasts, into armpits, through to back. Patient denies pain to jaw or arms. Patient states 6/10 pain. Patient diaphoretic in triage. Patient is AO x 4, VS w/elevated BP and HR, unable to walk at this time.

## 2022-06-25 LAB — RAPID URINE DRUG SCREEN, HOSP PERFORMED
Amphetamines: NOT DETECTED
Barbiturates: NOT DETECTED
Benzodiazepines: NOT DETECTED
Cocaine: NOT DETECTED
Opiates: NOT DETECTED
Tetrahydrocannabinol: POSITIVE — AB

## 2022-06-25 LAB — URINALYSIS, ROUTINE W REFLEX MICROSCOPIC
Bilirubin Urine: NEGATIVE
Glucose, UA: NEGATIVE mg/dL
Hgb urine dipstick: NEGATIVE
Ketones, ur: NEGATIVE mg/dL
Leukocytes,Ua: NEGATIVE
Nitrite: NEGATIVE
Protein, ur: NEGATIVE mg/dL
Specific Gravity, Urine: 1.01 (ref 1.005–1.030)
pH: 5.5 (ref 5.0–8.0)

## 2022-06-25 LAB — PREGNANCY, URINE: Preg Test, Ur: NEGATIVE

## 2022-06-25 LAB — TROPONIN I (HIGH SENSITIVITY): Troponin I (High Sensitivity): 2 ng/L (ref <18)

## 2022-06-25 NOTE — ED Provider Notes (Signed)
Care of the patient assumed at the change of shift. Here for epigastric/chest pain. Awaiting CT abd/pel and repeat Trop both of which are neg for acute process. Patient aware of liver hemangioma. Reassured no concerning findings with ED workup. Suspect this could be gastritis. Recommend OTC mylanta/maalox and PCP follow up. RTED for any other concerns    Truddie Hidden, MD 06/25/22 [id number]

## 2022-07-02 ENCOUNTER — Encounter: Payer: Self-pay | Admitting: Family

## 2022-07-02 ENCOUNTER — Ambulatory Visit: Payer: 59 | Admitting: Family

## 2022-07-02 VITALS — BP 112/76 | HR 104 | Temp 97.5°F | Resp 16 | Wt 216.0 lb

## 2022-07-02 DIAGNOSIS — M940 Chondrocostal junction syndrome [Tietze]: Secondary | ICD-10-CM | POA: Diagnosis not present

## 2022-07-02 DIAGNOSIS — B353 Tinea pedis: Secondary | ICD-10-CM

## 2022-07-02 NOTE — Progress Notes (Signed)
Subjective:   By signing my name below, I, [name], attest that this documentation has been prepared under the direction and in the presence of Melissa Chimera, NP 07/02/2022   Patient ID: Melissa Santiago, female    DOB: 1966-01-01, 56 y.o.   MRN: [id number]  Chief Complaint  Patient presents with   Follow-up    Here for ER follow up    HPI Patient is in today for an office visit  ER Follow Up: Around 06/22/2022, she was experiencing nausea and vomiting. On 06/24/2022, her nausea increased so she laid in the bed. She then begun to get chest pain. Her chest pain is at the center of her chest and radiates to her back. As of today's visit, she still experiences some nausea and intermittent chest pains. She does not believe that eating or walking increases her chest pain.She is not taking her 40 Mg of Omeprazole at the moment.  Peeling Left Foot: She complains of peeling and pain underneath her left foot. Symptoms appeared about a month ago. She went to urgent care and was prescribed antibiotics but her symptoms are persistent.  Right Ear Pain: She complains of right ear pain. She has a history of similar symptoms in the past.    Health Maintenance Due  Topic Date Due   HIV Screening  Never done   Hepatitis C Screening  Never done   Zoster Vaccines- Shingrix (1 of 2) Never done   TETANUS/TDAP  06/23/2022    Past Medical History:  Diagnosis Date   Acute lumbar radiculopathy 06/29/2013   Chest pain in adult 05/24/2018   Chronic constipation 05/24/2018   Gastroesophageal reflux disease 05/24/2018   GERD (gastroesophageal reflux disease)    Left sided sciatica 05/10/2013   Lumbar disc herniation 06/29/2013   Morbid obesity ([place]) 05/24/2018   Obesity    Osteopenia 03/23/2015   S/P bariatric surgery 06/30/2013    Past Surgical History:  Procedure Laterality Date   BREAST BIOPSY Left 2014   CHOLECYSTECTOMY  2022   [place] RESECTION  2014   [place]  SURGERY  2014/2016    Family History  Problem Relation Age of Onset   Arthritis Mother    Early death Mother    Mental illness Mother    Alcohol abuse Father    Arthritis Father    Early death Sister    Thyroid cancer Sister    Hypertension Sister    Heart attack Maternal Grandfather    Uterine cancer Sister    Hypertension Sister    Cervical cancer Sister    Depression Sister    Hypertension Sister    Colon cancer Neg Hx    Esophageal cancer Neg Hx    Breast cancer Neg Hx    Rectal cancer Neg Hx    Stomach cancer Neg Hx     Social History   Socioeconomic History   Marital status: Married    Spouse name: Melissa Santiago   Number of children: 2   Years of education: Not on file   Highest education level: Not on file  Occupational History   Occupation: Sales promotion account executive  Tobacco Use   Smoking status: Former    Packs/day: 0.50    Years: 15.00    Total pack years: 7.50    Types: Cigarettes    Quit date: 2006    Years since quitting: 17.6   Smokeless tobacco: Never  Vaping Use   Vaping Use: Never used  Substance and  Sexual Activity   Alcohol use: Yes    Comment: rarely   Drug use: Never   Sexual activity: Yes    Partners: Male    Birth control/protection: I.U.D.  Other Topics Concern   Not on file  Social History Narrative   From [place], Divorced since 2015   Works as a Geophysical data processor for [employer]   2 daughters- one in the [employer]- lives in [place], one in [place]- works as a Pharmacist, hospital   Enjoys spending time with her husband   Has a dog and will adopt another soon.    Social Determinants of Health   Financial Resource Strain: Low Risk  (05/24/2018)   Overall Financial Resource Strain (CARDIA)    Difficulty of Paying Living Expenses: Not hard at all  Food Insecurity: No Food Insecurity (05/24/2018)   Hunger Vital Sign    Worried About Running Out of Food in the Last Year: Never true    Ran Out of Food in the Last Year: Never true  Transportation Needs:  No Transportation Needs (05/24/2018)   PRAPARE - Hydrologist (Medical): No    Lack of Transportation (Non-Medical): No  Physical Activity: Unknown (05/24/2018)   Exercise Vital Sign    Days of Exercise per Week: 0 days    Minutes of Exercise per Session: Not on file  Stress: No Stress Concern Present (05/24/2018)   [place]    Feeling of Stress : Only a little  Social Connections: Somewhat Isolated (05/24/2018)   Social Connection and Isolation Panel [NHANES]    Frequency of Communication with Friends and Family: More than three times a week    Frequency of Social Gatherings with Friends and Family: Never    Attends Religious Services: Never    Marine scientist or Organizations: No    Attends Archivist Meetings: Never    Marital Status: Married  Human resources officer Violence: Not At Risk (05/24/2018)   Humiliation, Afraid, Rape, and Kick questionnaire    Fear of Current or Ex-Partner: No    Emotionally Abused: No    Physically Abused: No    Sexually Abused: No    Outpatient Medications Prior to Visit  Medication Sig Dispense Refill   augmented betamethasone dipropionate (DIPROLENE-AF) 0.05 % ointment Apply topically 2 (two) times daily. 50 g 2   DULoxetine (CYMBALTA) 60 MG capsule TAKE 1 CAPSULE BY MOUTH EVERY DAY 90 capsule 1   omeprazole (PRILOSEC) 40 MG capsule Take 1 capsule (40 mg total) by mouth in the morning and at bedtime. 180 capsule 1   ondansetron (ZOFRAN) 4 MG tablet Take 1 tablet (4 mg total) by mouth every 4 (four) hours as needed for nausea or vomiting. 12 tablet 0   sucralfate (CARAFATE) 1 g tablet Take 1 tablet (1 g total) by mouth with breakfast, with lunch, and with evening meal for 7 days. 21 tablet 0   No facility-administered medications prior to visit.    Allergies  Allergen Reactions   Tolmetin     Patient reports taking OTC ibuprofen and is not  allergic     Review of Systems  Musculoskeletal:        (+) Chest Pain  Skin:        (+) Dry Skin Underneath Left Foot       Objective:    Physical Exam Constitutional:      General: She is not in  acute distress.    Appearance: Normal appearance. She is not ill-appearing.  HENT:     Head: Normocephalic and atraumatic.     Right Ear: Tympanic membrane, ear canal and external ear normal.     Left Ear: External ear normal.  Eyes:     Extraocular Movements: Extraocular movements intact.     Pupils: Pupils are equal, round, and reactive to light.  Cardiovascular:     Rate and Rhythm: Normal rate and regular rhythm.     Heart sounds: Normal heart sounds. No murmur heard.    No gallop.  Pulmonary:     Effort: Pulmonary effort is normal. No respiratory distress.     Breath sounds: Normal breath sounds. No wheezing or rales.  Abdominal:     General: Bowel sounds are normal. There is no distension.     Palpations: Abdomen is soft.     Tenderness: There is no abdominal tenderness. There is no guarding.  Musculoskeletal:     Comments: Reproducible anterior chest wall tenderness to palpation  Feet:     Left foot:     Skin integrity: Dry skin present.     Comments: Dry Peeling Skin Plantar Surface Left Foot  Skin:    General: Skin is warm and dry.  Neurological:     Mental Status: She is alert and oriented to person, place, and time.  Psychiatric:        Mood and Affect: Mood normal.        Behavior: Behavior normal.        Judgment: Judgment normal.      BP 112/76 (BP Location: Right Arm, Patient Position: Sitting, Cuff Size: Small)   Pulse (!) 104   Temp (!) 97.5 F (36.4 C) (Oral)   Resp 16   Wt 216 lb (98 kg)   SpO2 96%   BMI 35.94 kg/m  Wt Readings from Last 3 Encounters:  07/02/22 216 lb (98 kg)  06/24/22 211 lb (95.7 kg)  04/07/22 218 lb (98.9 kg)       Assessment & Plan:   Problem List Items Addressed This Visit       Unprioritized   Tinea pedis  of left foot    New, has tried eucerin and steroid cream without improvement. Recommended that she try topical lamisil bid x 2 weeks.       Costochondritis - Primary    New.  Unfortunately, she cannot take nsaids due to her bariatric hx. ED visit included Ct abd/pelvis without acute findings, neg troponins, neg D dimer, normal CXR and reportedly normal EKG (I cannot view the EKG done in the ED).  Recommended tylenol prn. Can apply salon pas patches to chest wall as needed.  She is advised to call if symptoms worsen or if symptoms do not improve.        No orders of the defined types were placed in this encounter.   I, Nance Pear, NP, personally preformed the services described in this documentation.  All medical record entries made by the scribe were at my direction and in my presence.  I have reviewed the chart and discharge instructions (if applicable) and agree that the record reflects my personal performance and is accurate and complete. 07/02/2022   I,Amber Collins,acting as a scribe for Nance Pear, NP.,have documented all relevant documentation on the behalf of Nance Pear, NP,as directed by  Nance Pear, NP while in the presence of Nance Pear, NP.  Nance Pear, NP

## 2022-07-02 NOTE — Assessment & Plan Note (Signed)
New.  Unfortunately, she cannot take nsaids due to her bariatric hx. ED visit included Ct abd/pelvis without acute findings, neg troponins, neg D dimer, normal CXR and reportedly normal EKG (I cannot view the EKG done in the ED).  Recommended tylenol prn. Can apply salon pas patches to chest wall as needed.  She is advised to call if symptoms worsen or if symptoms do not improve.

## 2022-07-02 NOTE — Assessment & Plan Note (Signed)
New, has tried eucerin and steroid cream without improvement. Recommended that she try topical lamisil bid x 2 weeks.

## 2022-07-08 ENCOUNTER — Ambulatory Visit: Payer: 59 | Admitting: Family

## 2022-08-12 IMAGING — MG MM DIGITAL SCREENING BILAT W/ TOMO AND CAD
6 of 10 series · 6 of 30 positions shown · non-contrast
Comparison: Previous exam(s).

ACR Breast Density Category a: The breast tissue is almost entirely
fatty.

CLINICAL DATA: Screening.

EXAM:
DIGITAL SCREENING BILATERAL MAMMOGRAM WITH TOMOSYNTHESIS AND CAD
TECHNIQUE: Bilateral screening digital craniocaudal and mediolateral oblique
mammograms were obtained. Bilateral screening digital breast
tomosynthesis was performed. The images were evaluated with
computer-aided detection.

[R CV synth-2D]
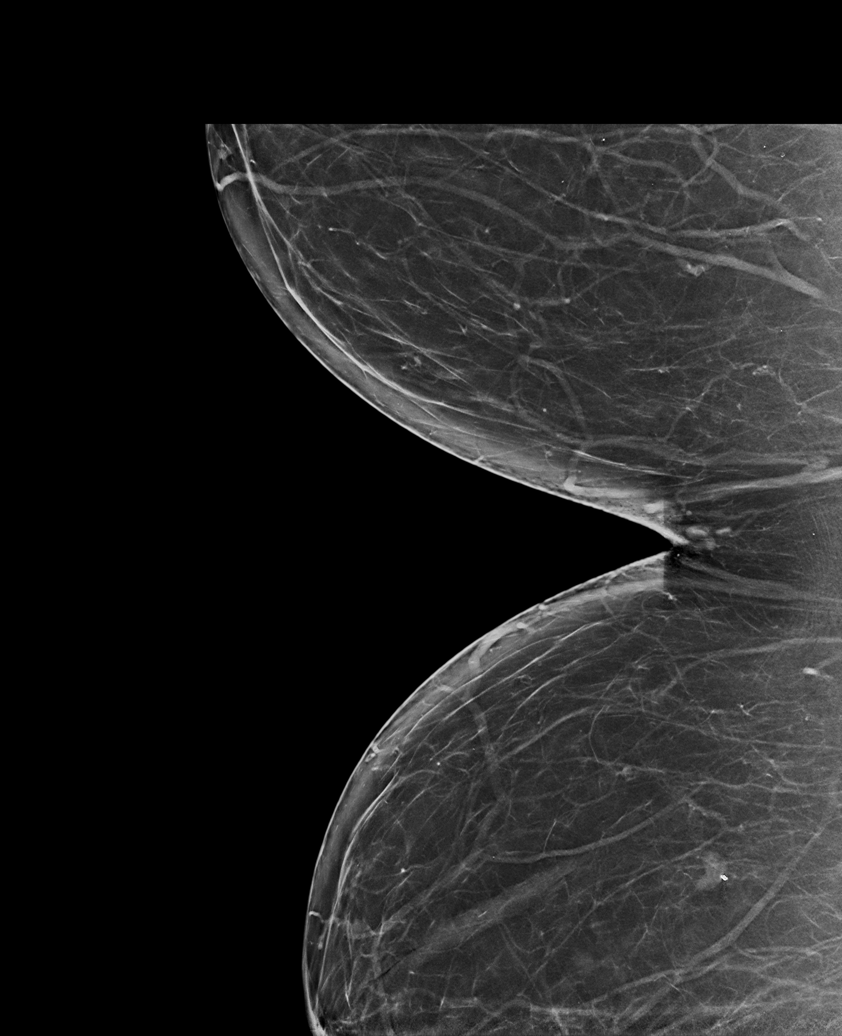

[L MLO synth-2D]
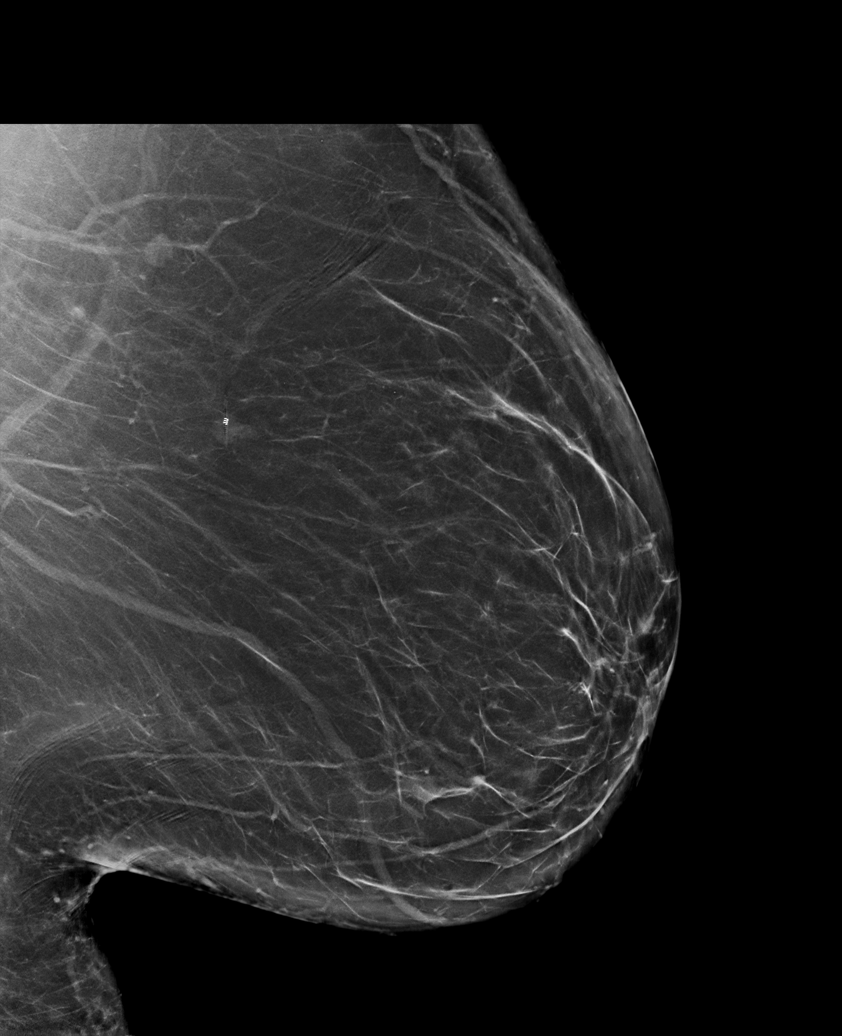

[R CC synth-2D]
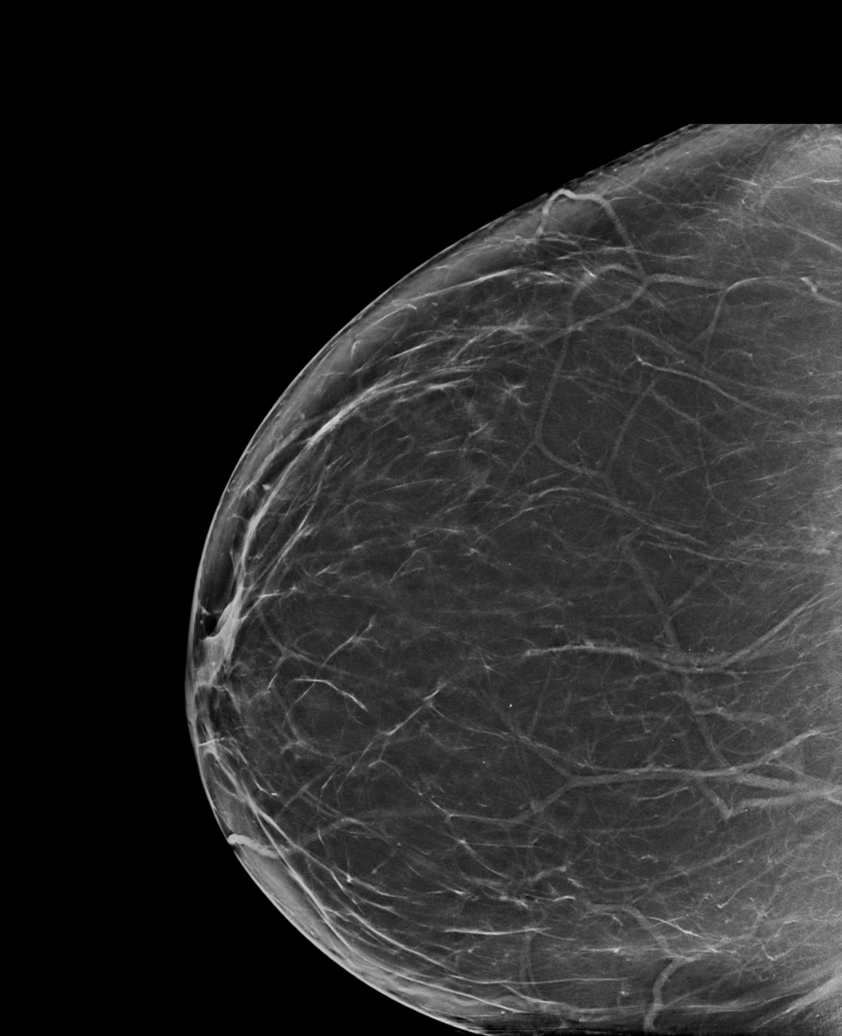

[L CC synth-2D]
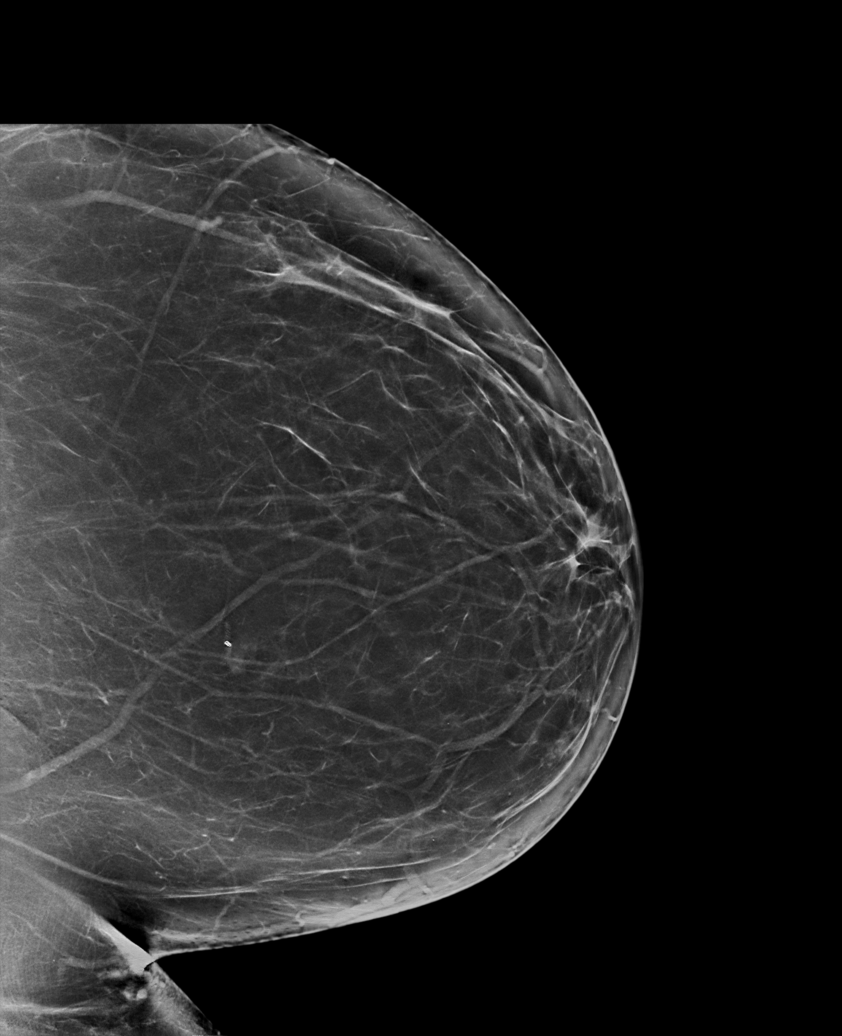

[R MLO synth-2D]
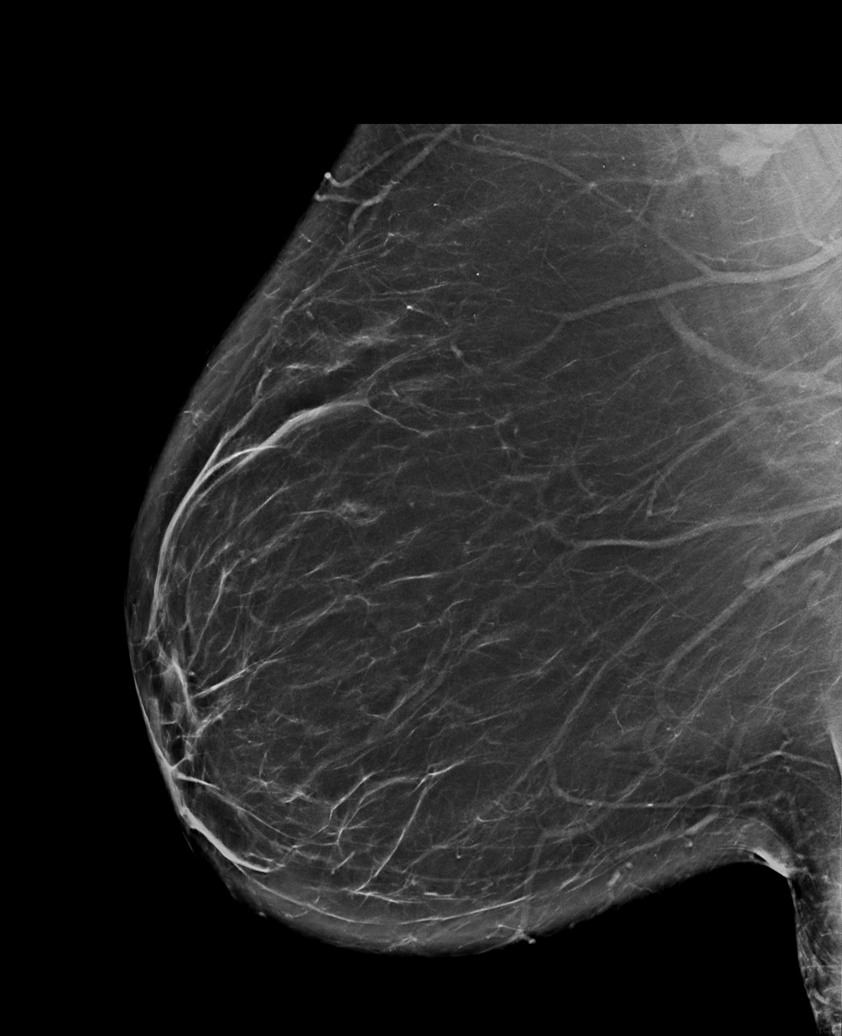

[R CC tomo · tomo slice 41/81.0]
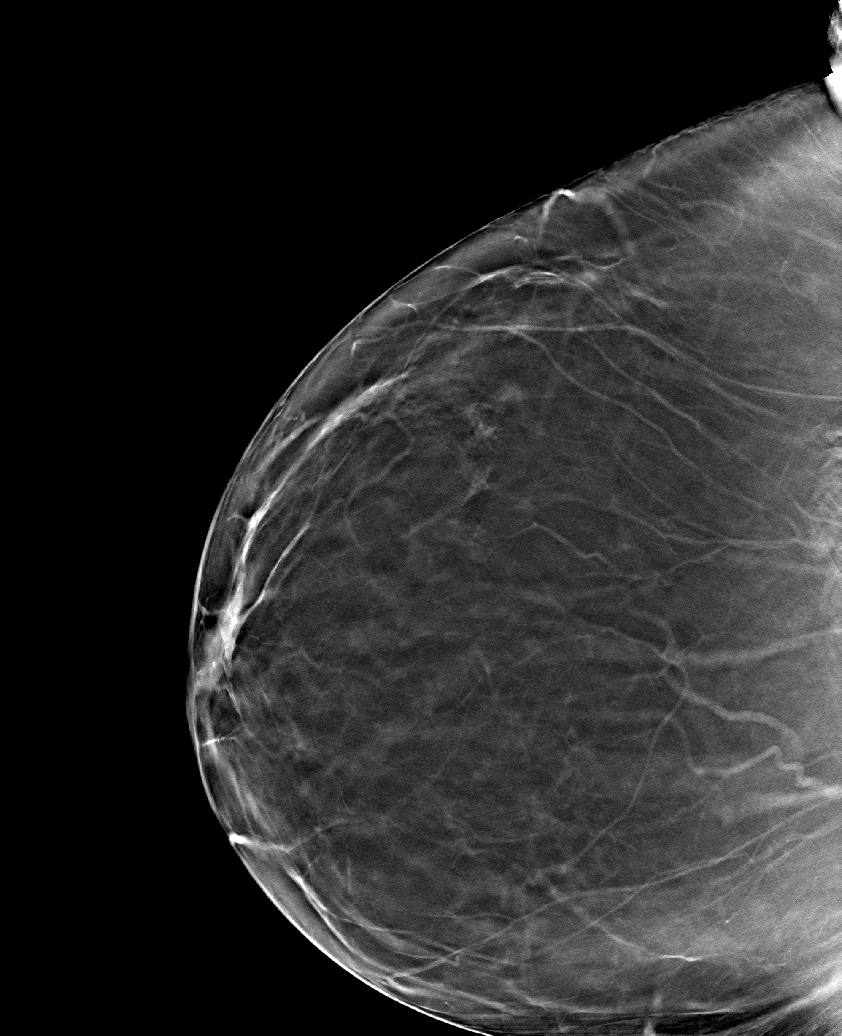

[6 of 30 positions shown; findings below may reference images not displayed]

FINDINGS: There are no findings suspicious for malignancy.
IMPRESSION: No mammographic evidence of malignancy. A result letter of this
screening mammogram will be mailed directly to the patient.

RECOMMENDATION:
Screening mammogram in one year. (Code:0E-3-N98)

BI-RADS CATEGORY  1: Negative.

## 2022-08-21 ENCOUNTER — Other Ambulatory Visit: Payer: Self-pay | Admitting: Family

## 2022-08-21 DIAGNOSIS — H8110 Benign paroxysmal vertigo, unspecified ear: Secondary | ICD-10-CM

## 2022-09-02 ENCOUNTER — Other Ambulatory Visit: Payer: Self-pay | Admitting: Family

## 2022-09-27 ENCOUNTER — Other Ambulatory Visit: Payer: Self-pay | Admitting: Family

## 2022-10-01 ENCOUNTER — Encounter: Payer: Self-pay | Admitting: Family

## 2022-10-01 ENCOUNTER — Telehealth: Payer: Self-pay | Admitting: Family

## 2022-10-01 ENCOUNTER — Ambulatory Visit (INDEPENDENT_AMBULATORY_CARE_PROVIDER_SITE_OTHER): Payer: BC Managed Care – PPO | Admitting: Family

## 2022-10-01 VITALS — BP 126/75 | HR 87 | Temp 97.7°F | Resp 16 | Ht 65.0 in | Wt 213.0 lb

## 2022-10-01 DIAGNOSIS — F32A Depression, unspecified: Secondary | ICD-10-CM

## 2022-10-01 DIAGNOSIS — M940 Chondrocostal junction syndrome [Tietze]: Secondary | ICD-10-CM

## 2022-10-01 DIAGNOSIS — F1011 Alcohol abuse, in remission: Secondary | ICD-10-CM | POA: Insufficient documentation

## 2022-10-01 DIAGNOSIS — F419 Anxiety disorder, unspecified: Secondary | ICD-10-CM

## 2022-10-01 DIAGNOSIS — B353 Tinea pedis: Secondary | ICD-10-CM

## 2022-10-01 DIAGNOSIS — F1010 Alcohol abuse, uncomplicated: Secondary | ICD-10-CM | POA: Diagnosis not present

## 2022-10-01 LAB — COMPREHENSIVE METABOLIC PANEL
ALT: 33 U/L (ref 0–35)
AST: 30 U/L (ref 0–37)
Albumin: 4 g/dL (ref 3.5–5.2)
Alkaline Phosphatase: 93 U/L (ref 39–117)
BUN: 9 mg/dL (ref 6–23)
CO2: 28 mEq/L (ref 19–32)
Calcium: 8.9 mg/dL (ref 8.4–10.5)
Chloride: 104 mEq/L (ref 96–112)
Creatinine, Ser: 0.71 mg/dL (ref 0.40–1.20)
GFR: 96.29 mL/min (ref >60.00)
Glucose, Bld: 84 mg/dL (ref 70–99)
Potassium: 4.2 mEq/L (ref 3.5–5.1)
Sodium: 138 mEq/L (ref 135–145)
Total Bilirubin: 0.5 mg/dL (ref 0.2–1.2)
Total Protein: 6.2 g/dL (ref 6.0–8.3)

## 2022-10-01 LAB — CBC WITH DIFFERENTIAL/PLATELET
Basophils Absolute: 0 10*3/uL (ref 0.0–0.1)
Basophils Relative: 0.9 % (ref 0.0–3.0)
Eosinophils Absolute: 0.1 10*3/uL (ref 0.0–0.7)
Eosinophils Relative: 1.1 % (ref 0.0–5.0)
HCT: 40.4 % (ref 36.0–46.0)
Hemoglobin: 13.5 g/dL (ref 12.0–15.0)
Lymphocytes Relative: 24.9 % (ref 12.0–46.0)
Lymphs Abs: 1.2 10*3/uL (ref 0.7–4.0)
MCHC: 33.4 g/dL (ref 30.0–36.0)
MCV: 87 fl (ref 78.0–100.0)
Monocytes Absolute: 0.4 10*3/uL (ref 0.1–1.0)
Monocytes Relative: 8 % (ref 3.0–12.0)
Neutro Abs: 3.1 10*3/uL (ref 1.4–7.7)
Neutrophils Relative %: 65.1 % (ref 43.0–77.0)
Platelets: 209 10*3/uL (ref 150.0–400.0)
RBC: 4.64 Mil/uL (ref 3.87–5.11)
RDW: 14.3 % (ref 11.5–15.5)
WBC: 4.8 10*3/uL (ref 4.0–10.5)

## 2022-10-01 MED ORDER — BUPROPION HCL ER (XL) 150 MG PO TB24: 150.0000 mg | ORAL_TABLET | Freq: Every day | ORAL | 2 refills | Status: DC

## 2022-10-01 NOTE — Telephone Encounter (Signed)
See mychart.  

## 2022-10-01 NOTE — Assessment & Plan Note (Signed)
Uncontrolled. Recommended that she call [facility] to inquire about their alcohol treatment program.

## 2022-10-01 NOTE — Patient Instructions (Addendum)
Please contact [facility] to look into their alcohol treatment program [phone] Try applying lamisil cream twice daily to foot.  Begin Wellbutrin for mood.

## 2022-10-01 NOTE — Assessment & Plan Note (Signed)
Resolved

## 2022-10-01 NOTE — Progress Notes (Signed)
Subjective:   By signing my name below, I, [name], attest that this documentation has been prepared under the direction and in the presence of [name], NP. 10/01/2022   Patient ID: Melissa Santiago, female    DOB: 1965/11/01, 56 y.o.   MRN: [id number]  Chief Complaint  Patient presents with   Follow-up    Flu shot already completed, recently had covid- given paxlovid from [facility], doing much better    HPI Patient is in today for a follow up visit.   Mood: Her mood is down during this visit. She is drinking a box of wine (4 bottles of wine) every 2 days. She typically starts drinks around 3pm. In the morning she constantly thinks about drinking at 4 pm. She has been doing this for the past year. She does not eat while drinking. She has days where she does not drink and had no withdrawal symptoms and reports feeling good. She visited her daughter who does not allow drinking and reports no new issues while visiting her. She had a liver panel done recently and reports no new issues. Her husband is worried about her mood and drinking habits. Her husband does not drink alcohol. She had an issue with alcohol most her life. 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 She quit at one point and only drank at social occasions but recently started drinking again this year. She has to drink in order to sleep well. If she does not drink, she does not sleep well. She does not wake up well rested. Her father and fathers side of the family has a history of alcoholism. Her father is currently alive and is sober.  She continues taking 60 mg Cymbalta daily PO and reports no new issues while taking it.   Itching: She continues having itching on her foot. She also developed generalized itching since her last visit. She is applying eucerin cream to her foot regularly and finds mild relief.   Chest wall tenderness: Her chest wall tenderness has resolved since her last visit.    Past  Medical History:  Diagnosis Date   Acute lumbar radiculopathy 06/29/2013   Chest pain in adult 05/24/2018   Chronic constipation 05/24/2018   Gastroesophageal reflux disease 05/24/2018   GERD (gastroesophageal reflux disease)    Left sided sciatica 05/10/2013   Lumbar disc herniation 06/29/2013   Morbid obesity ([place]) 05/24/2018   Obesity    Osteopenia 03/23/2015   S/P bariatric surgery 06/30/2013    Past Surgical History:  Procedure Laterality Date   BREAST BIOPSY Left 2014   CHOLECYSTECTOMY  2022   [place] RESECTION  2014   LUMBAR [place] SURGERY  2014/2016    Family History  Problem Relation Age of Onset   Arthritis Mother    Early death Mother    Mental illness Mother    Alcohol abuse Father    Arthritis Father    Early death Sister    Thyroid cancer Sister    Hypertension Sister    Heart attack Maternal Grandfather    Uterine cancer Sister    Hypertension Sister    Cervical cancer Sister    Depression Sister    Hypertension Sister    Colon cancer Neg Hx    Esophageal cancer Neg Hx    Breast cancer Neg Hx    Rectal cancer Neg Hx    Stomach cancer Neg Hx     Social History   Socioeconomic History  Marital status: Married    Spouse name: [name]   Number of children: 2   Years of education: Not on file   Highest education level: Not on file  Occupational History   Occupation: Sales promotion account executive  Tobacco Use   Smoking status: Former    Packs/day: 0.50    Years: 15.00    Total pack years: 7.50    Types: Cigarettes    Quit date: 2006    Years since quitting: 17.9   Smokeless tobacco: Never  Vaping Use   Vaping Use: Never used  Substance and Sexual Activity   Alcohol use: Yes    Comment: rarely   Drug use: Never   Sexual activity: Yes    Partners: Male    Birth control/protection: I.U.D.  Other Topics Concern   Not on file  Social History Narrative   From [place], Divorced since 2015   Works as a Geophysical data processor for [employer]   2 daughters- one in the [employer]- lives in [place], one in [place]- works as a Pharmacist, hospital   Enjoys spending time with her husband   Has a dog and will adopt another soon.    Social Determinants of Health   Financial Resource Strain: Low Risk  (05/24/2018)   Overall Financial Resource Strain (CARDIA)    Difficulty of Paying Living Expenses: Not hard at all  Food Insecurity: No Food Insecurity (05/24/2018)   Hunger Vital Sign    Worried About Running Out of Food in the Last Year: Never true    Ran Out of Food in the Last Year: Never true  Transportation Needs: No Transportation Needs (05/24/2018)   PRAPARE - Hydrologist (Medical): No    Lack of Transportation (Non-Medical): No  Physical Activity: Unknown (05/24/2018)   Exercise Vital Sign    Days of Exercise per Week: 0 days    Minutes of Exercise per Session: Not on file  Stress: No Stress Concern Present (05/24/2018)   [place]    Feeling of Stress : Only a little  Social Connections: Somewhat Isolated (05/24/2018)   Social Connection and Isolation Panel [NHANES]    Frequency of Communication with Friends and Family: More than three times a week    Frequency of Social Gatherings with Friends and Family: Never    Attends Religious Services: Never    Marine scientist or Organizations: No    Attends Archivist Meetings: Never    Marital Status: Married  Human resources officer Violence: Not At Risk (05/24/2018)   Humiliation, Afraid, Rape, and Kick questionnaire    Fear of Current or Ex-Partner: No    Emotionally Abused: No    Physically Abused: No    Sexually Abused: No    Outpatient Medications Prior to Visit  Medication Sig Dispense Refill   augmented betamethasone dipropionate (DIPROLENE-AF) 0.05 % ointment Apply topically 2 (two) times daily. 50 g 2   DULoxetine (CYMBALTA) 60 MG capsule TAKE 1 CAPSULE BY MOUTH EVERY DAY 90  capsule 1   omeprazole (PRILOSEC) 40 MG capsule Take 1 capsule (40 mg total) by mouth in the morning and at bedtime. 180 capsule 1   ondansetron (ZOFRAN) 4 MG tablet Take 1 tablet (4 mg total) by mouth every 4 (four) hours as needed for nausea or vomiting. 12 tablet 0   sucralfate (CARAFATE) 1 g tablet Take 1 tablet (1 g total) by mouth with breakfast, with  lunch, and with evening meal for 7 days. 21 tablet 0   No facility-administered medications prior to visit.    Allergies  Allergen Reactions   Tolmetin     Patient reports taking OTC ibuprofen and is not allergic     Review of Systems  Skin:  Positive for itching (generalized).  Psychiatric/Behavioral:  Positive for depression and substance abuse (alcohol).        Objective:    Physical Exam Constitutional:      General: She is not in acute distress.    Appearance: Normal appearance. She is not ill-appearing.  HENT:     Head: Normocephalic and atraumatic.     Right Ear: External ear normal.     Left Ear: External ear normal.  Eyes:     Extraocular Movements: Extraocular movements intact.     Pupils: Pupils are equal, round, and reactive to light.  Cardiovascular:     Rate and Rhythm: Normal rate and regular rhythm.     Heart sounds: Normal heart sounds. No murmur heard.    No gallop.  Pulmonary:     Effort: Pulmonary effort is normal. No respiratory distress.     Breath sounds: Normal breath sounds. No wheezing or rales.  Skin:    General: Skin is warm and dry.     Comments: Peeling skin plantar surface left foot  Neurological:     Mental Status: She is alert and oriented to person, place, and time.  Psychiatric:        Judgment: Judgment normal.     BP 126/75   Pulse 87   Temp 97.7 F (36.5 C) (Oral)   Resp 16   Ht '5\' 5"'$  (1.651 m)   Wt 213 lb (96.6 kg)   SpO2 99%   BMI 35.45 kg/m  Wt Readings from Last 3 Encounters:  10/01/22 213 lb (96.6 kg)  07/02/22 216 lb (98 kg)  06/24/22 211 lb (95.7 kg)       Assessment & Plan:  Alcohol abuse disorder Assessment & Plan: Uncontrolled. Recommended that she call [facility] to inquire about their alcohol treatment program.   Orders: -     Comprehensive metabolic panel -     CBC with Differential/Platelet  Costochondritis Assessment & Plan: Resolved.    Tinea pedis of left foot Assessment & Plan: Never tried lamisil. Recommended topical otc lamisil trial.    Morbid obesity ([place]) Assessment & Plan: Wt Readings from Last 3 Encounters:  10/01/22 213 lb (96.6 kg)  07/02/22 216 lb (98 kg)  06/24/22 211 lb (95.7 kg)   Weight is stable.  Discussed how decreasing her alcohol consumption will also help with weight loss.    Anxiety and depression Assessment & Plan: Depression is uncontrolled.  Continue cymbalta. Recommended that she add wellbutrin xl '150mg'$  once daily.    Other orders -     buPROPion HCl ER (XL); Take 1 tablet (150 mg total) by mouth daily.  Dispense: 30 tablet; Refill: 2    Meds ordered this encounter  Medications   buPROPion (WELLBUTRIN XL) 150 MG 24 hr tablet    Sig: Take 1 tablet (150 mg total) by mouth daily.    Dispense:  30 tablet    Refill:  2    Order Specific Question:   Supervising Provider    Answer:   Penni Homans A [[id number]]    I, Nance Pear, NP, personally preformed the services described in this documentation.  All medical record entries made by  the scribe were at my direction and in my presence.  I have reviewed the chart and discharge instructions (if applicable) and agree that the record reflects my personal performance and is accurate and complete. 10/01/2022   I,[name],acting as a Education administrator for Nance Pear, NP.,have documented all relevant documentation on the behalf of Nance Pear, NP,as directed by  Nance Pear, NP while in the presence of Nance Pear, NP.   Nance Pear, NP

## 2022-10-01 NOTE — Assessment & Plan Note (Signed)
Depression is uncontrolled.  Continue cymbalta. Recommended that she add wellbutrin xl '150mg'$  once daily.

## 2022-10-01 NOTE — Assessment & Plan Note (Addendum)
Never tried lamisil. Recommended topical otc lamisil trial.

## 2022-10-01 NOTE — Assessment & Plan Note (Signed)
Wt Readings from Last 3 Encounters:  10/01/22 213 lb (96.6 kg)  07/02/22 216 lb (98 kg)  06/24/22 211 lb (95.7 kg)   Weight is stable.  Discussed how decreasing her alcohol consumption will also help with weight loss.

## 2022-10-23 ENCOUNTER — Other Ambulatory Visit: Payer: Self-pay | Admitting: Family

## 2022-10-23 NOTE — Telephone Encounter (Signed)
This is a new rx, patient needs to follow up before getting greater ammount

## 2022-11-03 ENCOUNTER — Ambulatory Visit: Payer: BC Managed Care – PPO | Admitting: Family

## 2022-11-03 ENCOUNTER — Telehealth: Payer: Self-pay | Admitting: Family

## 2022-11-03 VITALS — BP 119/73 | HR 84 | Temp 97.8°F | Resp 16 | Wt 210.0 lb

## 2022-11-03 DIAGNOSIS — E669 Obesity, unspecified: Secondary | ICD-10-CM | POA: Insufficient documentation

## 2022-11-03 DIAGNOSIS — F1010 Alcohol abuse, uncomplicated: Secondary | ICD-10-CM | POA: Diagnosis not present

## 2022-11-03 DIAGNOSIS — F32A Depression, unspecified: Secondary | ICD-10-CM

## 2022-11-03 DIAGNOSIS — F419 Anxiety disorder, unspecified: Secondary | ICD-10-CM | POA: Diagnosis not present

## 2022-11-03 MED ORDER — WEGOVY 0.25 MG/0.5ML ~~LOC~~ SOAJ: 0.2500 mg | SUBCUTANEOUS | 1 refills | Status: DC

## 2022-11-03 NOTE — Telephone Encounter (Signed)
Please contact pharmacy and cancel [facility].

## 2022-11-03 NOTE — Progress Notes (Signed)
Subjective:   By signing my name below, I, Melissa Santiago, attest that this documentation has been prepared under the direction and in the presence of Melissa Alar, NP. 11/03/2022   Patient ID: Melissa Santiago, female    DOB: [birth year], 57 y.o.   MRN: [id number]  Chief Complaint  Patient presents with   Alcohol abuse disorder    Patient reports drinking less   Depression    "Doing better on medication"    Depression        Patient is in today for a follow up visit.   Alcohol: She is in a treatment. She has had 2 boxes of wine total since her last visit (4 weeks) as opposed to drinking a box of wine every 2 days. She has also joined an alcohol cessation program from her church. She calls her preacher every day at 4 pm when her urges to drink are strongest.  Mood: She started Wellbutrin last visit and she reports her mood has improved while taking it.   Wegovy: She reports her insurance is now covering wegovy and so she is requesting to start taking it.    Past Medical History:  Diagnosis Date   Acute lumbar radiculopathy 06/29/2013   Chest pain in adult 05/24/2018   Chronic constipation 05/24/2018   Gastroesophageal reflux disease 05/24/2018   GERD (gastroesophageal reflux disease)    Left sided sciatica 05/10/2013   Lumbar disc herniation 06/29/2013   Morbid obesity ([place]) 05/24/2018   Obesity    Osteopenia 03/23/2015   S/P bariatric surgery 06/30/2013    Past Surgical History:  Procedure Laterality Date   BREAST BIOPSY Left 2014   CHOLECYSTECTOMY  2022   [place] RESECTION  2014   [place] SURGERY  2014/2016    Family History  Problem Relation Age of Onset   Arthritis Mother    Early death Mother    Mental illness Mother    Alcohol abuse Father    Arthritis Father    Early death Sister    Thyroid cancer Sister    Hypertension Sister    Heart attack Maternal Grandfather    Uterine cancer Sister    Hypertension Sister    Cervical cancer  Sister    Depression Sister    Hypertension Sister    Colon cancer Neg Hx    Esophageal cancer Neg Hx    Breast cancer Neg Hx    Rectal cancer Neg Hx    Stomach cancer Neg Hx     Social History   Socioeconomic History   Marital status: Married    Spouse name: Melissa Santiago   Number of children: 2   Years of education: Not on file   Highest education level: Not on file  Occupational History   Occupation: Sales promotion account executive  Tobacco Use   Smoking status: Former    Packs/day: 0.50    Years: 15.00    Total pack years: 7.50    Types: Cigarettes    Quit date: 2006    Years since quitting: 18.0   Smokeless tobacco: Never  Vaping Use   Vaping Use: Never used  Substance and Sexual Activity   Alcohol use: Yes    Comment: rarely   Drug use: Never   Sexual activity: Yes    Partners: Male    Birth control/protection: I.U.D.  Other Topics Concern   Not on file  Social History Narrative   From [place], Divorced since 2015   Works as a Geophysical data processor  for [employer]   Remarried   2 daughters- one in the [employer]- lives in [place], one in [place]- works as a Pharmacist, hospital   Enjoys spending time with her husband   Has a dog and will adopt another soon.    Social Determinants of Health   Financial Resource Strain: Low Risk  (05/24/2018)   Overall Financial Resource Strain (CARDIA)    Difficulty of Paying Living Expenses: Not hard at all  Food Insecurity: No Food Insecurity (05/24/2018)   Hunger Vital Sign    Worried About Running Out of Food in the Last Year: Never true    Ran Out of Food in the Last Year: Never true  Transportation Needs: No Transportation Needs (05/24/2018)   PRAPARE - Hydrologist (Medical): No    Lack of Transportation (Non-Medical): No  Physical Activity: Unknown (05/24/2018)   Exercise Vital Sign    Days of Exercise per Week: 0 days    Minutes of Exercise per Session: Not on file  Stress: No Stress Concern Present (05/24/2018)   [place]    Feeling of Stress : Only a little  Social Connections: Somewhat Isolated (05/24/2018)   Social Connection and Isolation Panel [NHANES]    Frequency of Communication with Friends and Family: More than three times a week    Frequency of Social Gatherings with Friends and Family: Never    Attends Religious Services: Never    Marine scientist or Organizations: No    Attends Archivist Meetings: Never    Marital Status: Married  Human resources officer Violence: Not At Risk (05/24/2018)   Humiliation, Afraid, Rape, and Kick questionnaire    Fear of Current or Ex-Partner: No    Emotionally Abused: No    Physically Abused: No    Sexually Abused: No    Outpatient Medications Prior to Visit  Medication Sig Dispense Refill   augmented betamethasone dipropionate (DIPROLENE-AF) 0.05 % ointment Apply topically 2 (two) times daily. 50 g 2   buPROPion (WELLBUTRIN XL) 150 MG 24 hr tablet Take 1 tablet (150 mg total) by mouth daily. 30 tablet 2   DULoxetine (CYMBALTA) 60 MG capsule TAKE 1 CAPSULE BY MOUTH EVERY DAY 90 capsule 1   omeprazole (PRILOSEC) 40 MG capsule Take 1 capsule (40 mg total) by mouth in the morning and at bedtime. 180 capsule 1   ondansetron (ZOFRAN) 4 MG tablet Take 1 tablet (4 mg total) by mouth every 4 (four) hours as needed for nausea or vomiting. 12 tablet 0   sucralfate (CARAFATE) 1 g tablet Take 1 tablet (1 g total) by mouth with breakfast, with lunch, and with evening meal for 7 days. 21 tablet 0   No facility-administered medications prior to visit.    Allergies  Allergen Reactions   Tolmetin     Patient reports taking OTC ibuprofen and is not allergic     Review of Systems  Psychiatric/Behavioral:  Positive for depression.        Objective:    Physical Exam Constitutional:      General: She is not in acute distress.    Appearance: Normal appearance. She is not ill-appearing.   HENT:     Head: Normocephalic and atraumatic.     Right Ear: External ear normal.     Left Ear: External ear normal.  Eyes:     Extraocular Movements: Extraocular movements intact.     Pupils: Pupils are  equal, round, and reactive to light.  Cardiovascular:     Rate and Rhythm: Normal rate and regular rhythm.     Heart sounds: Normal heart sounds. No murmur heard.    No gallop.  Pulmonary:     Effort: Pulmonary effort is normal. No respiratory distress.     Breath sounds: Normal breath sounds. No wheezing or rales.  Skin:    General: Skin is warm and dry.  Neurological:     Mental Status: She is alert and oriented to person, place, and time.  Psychiatric:        Judgment: Judgment normal.     BP 119/73 (BP Location: Right Arm, Patient Position: Sitting, Cuff Size: Small)   Pulse 84   Temp 97.8 F (36.6 C) (Oral)   Resp 16   Wt 210 lb (95.3 kg)   SpO2 98%   BMI 34.95 kg/m  Wt Readings from Last 3 Encounters:  11/03/22 210 lb (95.3 kg)  10/01/22 213 lb (96.6 kg)  07/02/22 216 lb (98 kg)       Assessment & Plan:  Alcohol abuse disorder Assessment & Plan: She has cut back on her alcohol consumption significantly.  She was drinking 2 bottles/day and she has had the equivalent of 8 bottles in the last one month.  She denies any signs of withdrawal. She is working with her pastor at her church.  She continues to have alcohol cravings at 4 PM every day.  She is interested in trying [facility] both for weight loss as well as help with alcohol craving. She certainly would qualify from a BMI standpoint. I initially sent Rx but later saw family hx of thyroid cancer and advised pt that I did not think it was a safe option for her.    Anxiety and depression Assessment & Plan: She reports feeling better since starting Wellbutrin. Will continue wellbutrin along with cymbalta.      Other orders -     Wegovy; Inject 0.25 mg into the skin once a week.  Dispense: 2 mL; Refill: 1   30  minutes spent on today's visit.  Time was spent counseling patient, reviewing medical record.  I, Nance Pear, NP, personally preformed the services described in this documentation.  All medical record entries made by the scribe were at my direction and in my presence.  I have reviewed the chart and discharge instructions (if applicable) and agree that the record reflects my personal performance and is accurate and complete. 11/03/2022   I,Melissa Santiago,acting as a Education administrator for Nance Pear, NP.,have documented all relevant documentation on the behalf of Nance Pear, NP,as directed by  Nance Pear, NP while in the presence of Nance Pear, NP.   Nance Pear, NP

## 2022-11-03 NOTE — Assessment & Plan Note (Signed)
She reports feeling better since starting Wellbutrin. Will continue wellbutrin along with cymbalta.

## 2022-11-03 NOTE — Assessment & Plan Note (Addendum)
She has cut back on her alcohol consumption significantly.  She was drinking 2 bottles/day and she has had the equivalent of 8 bottles in the last one month.  She denies any signs of withdrawal. She is working with her pastor at her church.  She continues to have alcohol cravings at 4 PM every day.  She is interested in trying [facility] both for weight loss as well as help with alcohol craving. She certainly would qualify from a BMI standpoint. I initially sent Rx but later saw family hx of thyroid cancer and advised pt that I did not think it was a safe option for her.

## 2022-11-03 NOTE — Assessment & Plan Note (Signed)
Wt Readings from Last 3 Encounters:  11/03/22 210 lb (95.3 kg)  10/01/22 213 lb (96.6 kg)  07/02/22 216 lb (98 kg)   Trial of wegovy. Discussed side effects.

## 2022-11-04 ENCOUNTER — Other Ambulatory Visit: Payer: Self-pay

## 2022-11-05 NOTE — Telephone Encounter (Signed)
Yes, medication was cancelled.

## 2022-11-05 NOTE — Telephone Encounter (Signed)
This was completed, correct? Just checking, tks.

## 2022-12-05 ENCOUNTER — Ambulatory Visit: Payer: BC Managed Care – PPO | Admitting: Family

## 2022-12-31 ENCOUNTER — Other Ambulatory Visit: Payer: Self-pay | Admitting: Family

## 2023-01-02 ENCOUNTER — Telehealth (INDEPENDENT_AMBULATORY_CARE_PROVIDER_SITE_OTHER): Payer: BC Managed Care – PPO | Admitting: Family

## 2023-01-02 DIAGNOSIS — B002 Herpesviral gingivostomatitis and pharyngotonsillitis: Secondary | ICD-10-CM | POA: Insufficient documentation

## 2023-01-02 DIAGNOSIS — F419 Anxiety disorder, unspecified: Secondary | ICD-10-CM | POA: Diagnosis not present

## 2023-01-02 DIAGNOSIS — F32A Depression, unspecified: Secondary | ICD-10-CM | POA: Diagnosis not present

## 2023-01-02 DIAGNOSIS — F1010 Alcohol abuse, uncomplicated: Secondary | ICD-10-CM | POA: Diagnosis not present

## 2023-01-02 MED ORDER — VALACYCLOVIR HCL 500 MG PO TABS: 500.0000 mg | ORAL_TABLET | Freq: Every day | ORAL | 1 refills | Status: DC

## 2023-01-02 NOTE — Progress Notes (Signed)
MyChart Video Visit    Virtual Visit via Video Note     Patient location:  Patient and provider in visit Provider location: Office  I discussed the limitations of evaluation and management by telemedicine and the availability of in person appointments. The patient expressed understanding and agreed to proceed.  Visit Date: 01/02/2023.  Today's healthcare provider: Nance Pear, NP     Subjective:    Patient ID: Melissa Santiago, female    DOB: 08-27-1966, 57 y.o.   MRN: [id number]  Chief Complaint  Patient presents with   Mouth Lesions    Complains of recurrent cold sores     HPI Patient is in today for a virtual video visit.  Cold Sores:  She complains of recurring cold sores that are relatively new, up to 2 or 3 at a time. The initial onset was this past October 2023 when she was in [place]. She is experiencing a cycle with her sores almost resolving, and then flaring up again.  Mood: She admits to feeling stressed lately. Otherwise she is generally feeling okay on Wellbutrin and Cymbalta.  Alcohol Consumption:  She states that her alcohol use is almost over.   States that her insurance would not cover [phone].   Past Medical History:  Diagnosis Date   Acute lumbar radiculopathy 06/29/2013   Chest pain in adult 05/24/2018   Chronic constipation 05/24/2018   Gastroesophageal reflux disease 05/24/2018   GERD (gastroesophageal reflux disease)    Left sided sciatica 05/10/2013   Lumbar disc herniation 06/29/2013   Morbid obesity ([place]) 05/24/2018   Obesity    Osteopenia 03/23/2015   S/P bariatric surgery 06/30/2013    Past Surgical History:  Procedure Laterality Date   BREAST BIOPSY Left 2014   CHOLECYSTECTOMY  2022   [place] RESECTION  2014   [place] SURGERY  2014/2016    Family History  Problem Relation Age of Onset   Arthritis Mother    Early death Mother    Mental illness Mother    Alcohol abuse Father    Arthritis Father     Early death Sister    Thyroid cancer Sister    Hypertension Sister    Heart attack Maternal Grandfather    Uterine cancer Sister    Hypertension Sister    Cervical cancer Sister    Depression Sister    Hypertension Sister    Colon cancer Neg Hx    Esophageal cancer Neg Hx    Breast cancer Neg Hx    Rectal cancer Neg Hx    Stomach cancer Neg Hx     Social History   Socioeconomic History   Marital status: Married    Spouse name: [name]   Number of children: 2   Years of education: Not on file   Highest education level: Not on file  Occupational History   Occupation: Sales promotion account executive  Tobacco Use   Smoking status: Former    Packs/day: 0.50    Years: 15.00    Total pack years: 7.50    Types: Cigarettes    Quit date: 2006    Years since quitting: 18.1   Smokeless tobacco: Never  Vaping Use   Vaping Use: Never used  Substance and Sexual Activity   Alcohol use: Yes    Comment: rarely   Drug use: Never   Sexual activity: Yes    Partners: Male    Birth control/protection: I.U.D.  Other Topics Concern   Not on file  Social History Narrative   From [place], Divorced since 2015   Works as a Geophysical data processor for [employer]   2 daughters- one in the [employer]- lives in [place], one in [place]- works as a Pharmacist, hospital   Enjoys spending time with her husband   Has a dog and will adopt another soon.    Social Determinants of Health   Financial Resource Strain: Low Risk  (05/24/2018)   Overall Financial Resource Strain (CARDIA)    Difficulty of Paying Living Expenses: Not hard at all  Food Insecurity: No Food Insecurity (05/24/2018)   Hunger Vital Sign    Worried About Running Out of Food in the Last Year: Never true    Ran Out of Food in the Last Year: Never true  Transportation Needs: No Transportation Needs (05/24/2018)   PRAPARE - Hydrologist (Medical): No    Lack of Transportation (Non-Medical): No  Physical Activity: Unknown (05/24/2018)    Exercise Vital Sign    Days of Exercise per Week: 0 days    Minutes of Exercise per Session: Not on file  Stress: No Stress Concern Present (05/24/2018)   [place]    Feeling of Stress : Only a little  Social Connections: Somewhat Isolated (05/24/2018)   Social Connection and Isolation Panel [NHANES]    Frequency of Communication with Friends and Family: More than three times a week    Frequency of Social Gatherings with Friends and Family: Never    Attends Religious Services: Never    Marine scientist or Organizations: No    Attends Archivist Meetings: Never    Marital Status: Married  Human resources officer Violence: Not At Risk (05/24/2018)   Humiliation, Afraid, Rape, and Kick questionnaire    Fear of Current or Ex-Partner: No    Emotionally Abused: No    Physically Abused: No    Sexually Abused: No    Outpatient Medications Prior to Visit  Medication Sig Dispense Refill   augmented betamethasone dipropionate (DIPROLENE-AF) 0.05 % ointment Apply topically 2 (two) times daily. 50 g 2   buPROPion (WELLBUTRIN XL) 150 MG 24 hr tablet TAKE 1 TABLET BY MOUTH EVERY DAY 90 tablet 1   DULoxetine (CYMBALTA) 60 MG capsule TAKE 1 CAPSULE BY MOUTH EVERY DAY 90 capsule 1   omeprazole (PRILOSEC) 40 MG capsule Take 1 capsule (40 mg total) by mouth in the morning and at bedtime. 180 capsule 1   ondansetron (ZOFRAN) 4 MG tablet Take 1 tablet (4 mg total) by mouth every 4 (four) hours as needed for nausea or vomiting. 12 tablet 0   sucralfate (CARAFATE) 1 g tablet Take 1 tablet (1 g total) by mouth with breakfast, with lunch, and with evening meal for 7 days. 21 tablet 0   No facility-administered medications prior to visit.    Allergies  Allergen Reactions   Tolmetin     Patient reports taking OTC ibuprofen and is not allergic     Review of Systems  Constitutional:  Negative for fever.  HENT:  Negative for  congestion, sinus pain and sore throat.        Cold sores.  Respiratory:  Negative for cough, shortness of breath and wheezing.   Cardiovascular:  Negative for chest pain and palpitations.  Gastrointestinal:  Negative for blood in stool, constipation, diarrhea, nausea and vomiting.  Genitourinary:  Negative for dysuria, frequency and hematuria.  Musculoskeletal:  Negative  for joint pain and myalgias.       Objective:    Gen: Awake, alert, no acute distress. ENT: several cold sores noted left upper lip Resp: Breathing is even and non-labored. Psych: calm/pleasant demeanor. Neuro: Alert and Oriented x3, + facial symmetry, speech is clear.   There were no vitals taken for this visit. Wt Readings from Last 3 Encounters:  11/03/22 210 lb (95.3 kg)  10/01/22 213 lb (96.6 kg)  07/02/22 216 lb (98 kg)      Assessment & Plan:   Problem List Items Addressed This Visit       Unprioritized   Oral herpes - Primary    New. Has been off and on since October 2023. She has had a lot of stress lately.  Will begin valtrex '500mg'$  once daily for prophylaxis. She will let me know if she is having breakthrough lesions on this dose.        Relevant Medications   valACYclovir (VALTREX) 500 MG tablet   Anxiety and depression    Reports stable mood on wellbutrin/cymbalta. Continue same.       Alcohol abuse disorder    Improved. Reports that she has almost completed stopped drinking and is meeting weekly with her pastor which is helpful.         Meds ordered this encounter  Medications   valACYclovir (VALTREX) 500 MG tablet    Sig: Take 1 tablet (500 mg total) by mouth daily.    Dispense:  90 tablet    Refill:  1    Order Specific Question:   Supervising Provider    Answer:   Penni Homans A [[id number]]    I discussed the assessment and treatment plan with the patient. The patient was provided an opportunity to ask questions and all were answered. The patient agreed with the plan and  demonstrated an understanding of the instructions.   The patient was advised to call back or seek an in-person evaluation if the symptoms worsen or if the condition fails to improve as anticipated.  I,Mathew Stumpf,acting as a Education administrator for [employer], NP.,have documented all relevant documentation on the behalf of Nance Pear, NP,as directed by  Nance Pear, NP while in the presence of Nance Pear, NP.  I, Nance Pear, NP, personally preformed the services described in this documentation.  All medical record entries made by the scribe were at my direction and in my presence.  I have reviewed the chart and discharge instructions (if applicable) and agree that the record reflects my personal performance and is accurate and complete.  Nance Pear, NP [facility] [place] (phone) [phone] (fax)  [place]

## 2023-01-02 NOTE — Assessment & Plan Note (Signed)
Improved. Reports that she has almost completed stopped drinking and is meeting weekly with her pastor which is helpful.

## 2023-01-02 NOTE — Assessment & Plan Note (Signed)
New. Has been off and on since October 2023. She has had a lot of stress lately.  Will begin valtrex '500mg'$  once daily for prophylaxis. She will let me know if she is having breakthrough lesions on this dose.

## 2023-01-02 NOTE — Assessment & Plan Note (Signed)
Reports stable mood on wellbutrin/cymbalta. Continue same.

## 2023-02-25 ENCOUNTER — Encounter (HOSPITAL_BASED_OUTPATIENT_CLINIC_OR_DEPARTMENT_OTHER): Payer: Self-pay | Admitting: Radiology

## 2023-02-25 ENCOUNTER — Emergency Department (HOSPITAL_BASED_OUTPATIENT_CLINIC_OR_DEPARTMENT_OTHER)
Admission: EM | Admit: 2023-02-25 | Discharge: 2023-02-25 | Disposition: A | Discharge status: Home / Self Care | Payer: BC Managed Care – PPO | Attending: Emergency Medicine | Admitting: Emergency Medicine

## 2023-02-25 ENCOUNTER — Other Ambulatory Visit: Payer: Self-pay

## 2023-02-25 ENCOUNTER — Emergency Department (HOSPITAL_BASED_OUTPATIENT_CLINIC_OR_DEPARTMENT_OTHER): Payer: BC Managed Care – PPO

## 2023-02-25 DIAGNOSIS — M546 Pain in thoracic spine: Secondary | ICD-10-CM

## 2023-02-25 DIAGNOSIS — Z87891 Personal history of nicotine dependence: Secondary | ICD-10-CM | POA: Diagnosis not present

## 2023-02-25 DIAGNOSIS — R9431 Abnormal electrocardiogram [ECG] [EKG]: Secondary | ICD-10-CM | POA: Diagnosis not present

## 2023-02-25 DIAGNOSIS — R071 Chest pain on breathing: Secondary | ICD-10-CM | POA: Diagnosis not present

## 2023-02-25 LAB — COMPREHENSIVE METABOLIC PANEL
ALT: 22 U/L (ref 0–44)
AST: 26 U/L (ref 15–41)
Albumin: 3.5 g/dL (ref 3.5–5.0)
Alkaline Phosphatase: 97 U/L (ref 38–126)
Anion gap: 13 (ref 5–15)
BUN: 13 mg/dL (ref 6–20)
CO2: 24 mmol/L (ref 22–32)
Calcium: 9.1 mg/dL (ref 8.9–10.3)
Chloride: 100 mmol/L (ref 98–111)
Creatinine, Ser: 0.67 mg/dL (ref 0.44–1.00)
GFR, Estimated: >60 mL/min (ref >60)
Glucose, Bld: 107 mg/dL — ABNORMAL HIGH (ref 70–99)
Potassium: 3.7 mmol/L (ref 3.5–5.1)
Sodium: 137 mmol/L (ref 135–145)
Total Bilirubin: 0.3 mg/dL (ref 0.3–1.2)
Total Protein: 6.5 g/dL (ref 6.5–8.1)

## 2023-02-25 LAB — CBC WITH DIFFERENTIAL/PLATELET
Abs Immature Granulocytes: 0.03 10*3/uL (ref 0.00–0.07)
Basophils Absolute: 0 10*3/uL (ref 0.0–0.1)
Basophils Relative: 0 %
Eosinophils Absolute: 0.1 10*3/uL (ref 0.0–0.5)
Eosinophils Relative: 1 %
HCT: 38.8 % (ref 36.0–46.0)
Hemoglobin: 12.8 g/dL (ref 12.0–15.0)
Immature Granulocytes: 0 %
Lymphocytes Relative: 17 %
Lymphs Abs: 1.2 10*3/uL (ref 0.7–4.0)
MCH: 30.2 pg (ref 26.0–34.0)
MCHC: 33 g/dL (ref 30.0–36.0)
MCV: 91.5 fL (ref 80.0–100.0)
Monocytes Absolute: 0.5 10*3/uL (ref 0.1–1.0)
Monocytes Relative: 7 %
Neutro Abs: 5.4 10*3/uL (ref 1.7–7.7)
Neutrophils Relative %: 75 %
Platelets: 224 10*3/uL (ref 150–400)
RBC: 4.24 MIL/uL (ref 3.87–5.11)
RDW: 15.5 % (ref 11.5–15.5)
WBC: 7.2 10*3/uL (ref 4.0–10.5)
nRBC: 0 % (ref 0.0–0.2)

## 2023-02-25 LAB — D-DIMER, QUANTITATIVE: D-Dimer, Quant: 0.5 ug/mL-FEU (ref 0.00–0.50)

## 2023-02-25 NOTE — Discharge Instructions (Signed)
You were evaluated in the Emergency Department and after careful evaluation, we did not find any emergent condition requiring admission or further testing in the hospital.  Your exam/testing today is overall reassuring.  No signs of blood clots today.  Commend Tylenol or Motrin for discomfort.  Please return to the Emergency Department if you experience any worsening of your condition.   Thank you for allowing [place] to be a part of your care.

## 2023-02-25 NOTE — ED Triage Notes (Signed)
Pt states she has been on 4 different flights in the past week. Pt having pain in her back on the right side and hurts when she takes a deep breath. Pt states her left leg also has throbbing pain that started behind the knee cap and goes into her calf area. Pt states her daughter and her mother have all had PE after flying.

## 2023-02-25 NOTE — ED Provider Notes (Signed)
[facility]-EMERGENCY DEPT [facility] Emergency Department Provider Note MRN:  [id number]  Arrival date & time: 02/25/23     Chief Complaint   Back pain History of Present Illness   Melissa Santiago is a 57 y.o. year-old female with no pertinent past medical history presenting to the ED with chief complaint of back pain.  Multiple flights recently, strong family history of blood clots after flights.  Had some left leg pain a few days ago which has resolved.  Now she has right thoracic back pain that is a bit worse with deep breaths.  Denies shortness of breath, no other complaints.  Review of Systems  A thorough review of systems was obtained and all systems are negative except as noted in the HPI and PMH.   Patient's Health History    Past Medical History:  Diagnosis Date   Acute lumbar radiculopathy 06/29/2013   Chest pain in adult 05/24/2018   Chronic constipation 05/24/2018   Gastroesophageal reflux disease 05/24/2018   GERD (gastroesophageal reflux disease)    Left sided sciatica 05/10/2013   Lumbar disc herniation 06/29/2013   Morbid obesity (HCC) 05/24/2018   Obesity    Osteopenia 03/23/2015   S/P bariatric surgery 06/30/2013    Past Surgical History:  Procedure Laterality Date   BREAST BIOPSY Left 2014   CHOLECYSTECTOMY  2022   LAPAROSCOPIC GASTRIC SLEEVE RESECTION  2014   LUMBAR DISC SURGERY  2014/2016    Family History  Problem Relation Age of Onset   Arthritis Mother    Early death Mother    Mental illness Mother    Alcohol abuse Father    Arthritis Father    Early death Sister    Thyroid cancer Sister    Hypertension Sister    Heart attack Maternal Grandfather    Uterine cancer Sister    Hypertension Sister    Cervical cancer Sister    Depression Sister    Hypertension Sister    Colon cancer Neg Hx    Esophageal cancer Neg Hx    Breast cancer Neg Hx    Rectal cancer Neg Hx    Stomach cancer Neg Hx     Social History   Socioeconomic History   Marital  status: Married    Spouse name: Caryn Bee   Number of children: 2   Years of education: Not on file   Highest education level: Not on file  Occupational History   Occupation: Nature conservation officer  Tobacco Use   Smoking status: Former    Packs/day: 0.50    Years: 15.00    Additional pack years: 0.00    Total pack years: 7.50    Types: Cigarettes    Quit date: 2006    Years since quitting: 18.3   Smokeless tobacco: Never  Vaping Use   Vaping Use: Never used  Substance and Sexual Activity   Alcohol use: Yes    Comment: rarely   Drug use: Never   Sexual activity: Yes    Partners: Male    Birth control/protection: I.U.D.  Other Topics Concern   Not on file  Social History Narrative   From [place], Divorced since 2015   Works as a Research officer, political party for [employer]   2 daughters- one in the [employer]- lives in [place], one in [place]- works as a Runner, broadcasting/film/video   Enjoys spending time with her husband   Has a dog and will adopt another soon.    Social Determinants of Health  Financial Resource Strain: Low Risk  (05/24/2018)   Overall Financial Resource Strain (CARDIA)    Difficulty of Paying Living Expenses: Not hard at all  Food Insecurity: No Food Insecurity (05/24/2018)   Hunger Vital Sign    Worried About Running Out of Food in the Last Year: Never true    Ran Out of Food in the Last Year: Never true  Transportation Needs: No Transportation Needs (05/24/2018)   PRAPARE - Administrator, Civil Service (Medical): No    Lack of Transportation (Non-Medical): No  Physical Activity: Unknown (05/24/2018)   Exercise Vital Sign    Days of Exercise per Week: 0 days    Minutes of Exercise per Session: Not on file  Stress: No Stress Concern Present (05/24/2018)   [employer] of Occupational Health - Occupational Stress Questionnaire    Feeling of Stress : Only a little  Social Connections: Somewhat Isolated (05/24/2018)   Social Connection and Isolation Panel [NHANES]     Frequency of Communication with Friends and Family: More than three times a week    Frequency of Social Gatherings with Friends and Family: Never    Attends Religious Services: Never    Database administrator or Organizations: No    Attends Banker Meetings: Never    Marital Status: Married  Catering manager Violence: Not At Risk (05/24/2018)   Humiliation, Afraid, Rape, and Kick questionnaire    Fear of Current or Ex-Partner: No    Emotionally Abused: No    Physically Abused: No    Sexually Abused: No     Physical Exam   Vitals:   02/25/23 2242 02/25/23 2243  BP:  115/73  Pulse:  97  Resp:  20  Temp: 98.1 F (36.7 C)   SpO2:  99%    CONSTITUTIONAL: Well-appearing, NAD NEURO/PSYCH:  Alert and oriented x 3, no focal deficits EYES:  eyes equal and reactive ENT/NECK:  no LAD, no JVD CARDIO: Regular rate, well-perfused, normal S1 and S2 PULM:  CTAB no wheezing or rhonchi GI/GU:  non-distended, non-tender MSK/SPINE:  No gross deformities, no edema SKIN:  no rash, atraumatic   *Additional and/or pertinent findings included in MDM below  Diagnostic and Interventional Summary    EKG Interpretation  Date/Time:  Wednesday Feb 25 2023 19:18:16 EDT Ventricular Rate:  87 PR Interval:  132 QRS Duration: 96 QT Interval:  368 QTC Calculation: 443 R Axis:   69 Text Interpretation: Sinus rhythm Low voltage, precordial leads Confirmed by Kennis Carina [phone]) on 02/25/2023 10:58:21 PM       Labs Reviewed  COMPREHENSIVE METABOLIC PANEL - Abnormal; Notable for the following components:      Result Value   Glucose, Bld 107 (*)    All other components within normal limits  D-DIMER, QUANTITATIVE  CBC WITH DIFFERENTIAL/PLATELET    DG Chest 2 View  Final Result      Medications - No data to display   Procedures  /  Critical Care Procedures  ED Course and Medical Decision Making  Initial Impression and Ddx Patient is sitting comfortably in no acute distress  with normal vital signs.  No evidence of DVT on exam.  Not having any chest pain and so doubt ACS.  Considered low risk for PE.  Her Wells score puts her at a 1.3% chance of PE at this time.  Past medical/surgical history that increases complexity of ED encounter: None  Interpretation of Diagnostics I personally reviewed the EKG and my  interpretation is as follows: Sinus rhythm  Chest x-ray is normal, no pneumothorax, labs reassuring with no significant blood count or electrolyte disturbance, D-dimer negative.  Patient Reassessment and Ultimate Disposition/Management     With the low Wells score and the negative D-dimer PE seems to be largely excluded at this time.  CT PE study discussed as an option, deferred for now, strict return precautions, anti-inflammatories.  Patient management required discussion with the following services or consulting groups:  None  Complexity of Problems Addressed Acute illness or injury that poses threat of life of bodily function  Additional Data Reviewed and Analyzed Further history obtained from: Further history from spouse/family member  Additional Factors Impacting ED Encounter Risk None  Elmer Sow. Pilar Plate, MD [facility] Emergency Medicine [facility] [email or web address]  Final Clinical Impressions(s) / ED Diagnoses     ICD-10-CM   1. Acute right-sided thoracic back pain  M54.6       ED Discharge Orders     None        Discharge Instructions Discussed with and Provided to Patient:    Discharge Instructions      You were evaluated in the Emergency Department and after careful evaluation, we did not find any emergent condition requiring admission or further testing in the hospital.  Your exam/testing today is overall reassuring.  No signs of blood clots today.  Commend Tylenol or Motrin for discomfort.  Please return to the Emergency Department if you experience any worsening of your condition.   Thank you for  allowing [place] to be a part of your care.      Sabas Sous, MD 02/25/23 [phone]

## 2023-03-16 ENCOUNTER — Ambulatory Visit: Payer: BC Managed Care – PPO | Admitting: Family

## 2023-03-20 ENCOUNTER — Telehealth: Payer: BC Managed Care – PPO | Admitting: Family Medicine

## 2023-03-20 ENCOUNTER — Telehealth: Payer: BC Managed Care – PPO | Admitting: Nurse Practitioner

## 2023-03-20 ENCOUNTER — Encounter: Payer: Self-pay | Admitting: Family

## 2023-03-20 DIAGNOSIS — L299 Pruritus, unspecified: Secondary | ICD-10-CM

## 2023-03-20 DIAGNOSIS — H1013 Acute atopic conjunctivitis, bilateral: Secondary | ICD-10-CM | POA: Diagnosis not present

## 2023-03-20 DIAGNOSIS — L309 Dermatitis, unspecified: Secondary | ICD-10-CM

## 2023-03-20 MED ORDER — FAMOTIDINE 20 MG PO TABS
20.0000 mg | ORAL_TABLET | Freq: Two times a day (BID) | ORAL | 2 refills | Status: DC
Start: 2023-03-20 — End: 2024-03-04

## 2023-03-20 MED ORDER — MUPIROCIN CALCIUM 2 % EX CREA
1.0000 | TOPICAL_CREAM | Freq: Two times a day (BID) | CUTANEOUS | 0 refills | Status: DC
Start: 2023-03-20 — End: 2024-03-04

## 2023-03-20 MED ORDER — OLOPATADINE HCL 0.1 % OP SOLN
1.0000 [drp] | Freq: Two times a day (BID) | OPHTHALMIC | 12 refills | Status: AC
Start: 2023-03-20 — End: 2023-06-18

## 2023-03-20 MED ORDER — PREDNISONE 20 MG PO TABS: 40.0000 mg | ORAL_TABLET | Freq: Every day | ORAL | 0 refills | Status: AC

## 2023-03-20 MED ORDER — HYDROXYZINE PAMOATE 25 MG PO CAPS: 25.0000 mg | ORAL_CAPSULE | Freq: Three times a day (TID) | ORAL | 0 refills | Status: DC | PRN

## 2023-03-20 MED ORDER — BETAMETHASONE DIPROPIONATE 0.05 % EX CREA
TOPICAL_CREAM | Freq: Two times a day (BID) | CUTANEOUS | 0 refills | Status: DC
Start: 2023-03-20 — End: 2024-03-04

## 2023-03-20 NOTE — Progress Notes (Signed)
Virtual Visit Consent   Melissa Santiago, you are scheduled for a virtual visit with a [facility] provider today. Just as with appointments in the office, your consent must be obtained to participate. Your consent will be active for this visit and any virtual visit you may have with one of our providers in the next 365 days. If you have a MyChart account, a copy of this consent can be sent to you electronically.  As this is a virtual visit, video technology does not allow for your provider to perform a traditional examination. This may limit your provider's ability to fully assess your condition. If your provider identifies any concerns that need to be evaluated in person or the need to arrange testing (such as labs, EKG, etc.), we will make arrangements to do so. Although advances in technology are sophisticated, we cannot ensure that it will always work on either your end or our end. If the connection with a video visit is poor, the visit may have to be switched to a telephone visit. With either a video or telephone visit, we are not always able to ensure that we have a secure connection.  By engaging in this virtual visit, you consent to the provision of healthcare and authorize for your insurance to be billed (if applicable) for the services provided during this visit. Depending on your insurance coverage, you may receive a charge related to this service.  I need to obtain your verbal consent now. Are you willing to proceed with your visit today? Melissa Santiago has provided verbal consent on 03/20/2023 for a virtual visit (video or telephone). Viviano Simas, FNP  Date: 03/20/2023 8:01 AM  Virtual Visit via Video Note   I, Viviano Simas, connected with  Melissa Santiago  ([id number], 1965-12-07) on 03/20/23 at  8:00 AM EDT by a video-enabled telemedicine application and verified that I am speaking with the correct person using two identifiers.  Location: Patient: Virtual Visit Location Patient:  Home Provider: Virtual Visit Location Provider: Home Office   I discussed the limitations of evaluation and management by telemedicine and the availability of in person appointments. The patient expressed understanding and agreed to proceed.    History of Present Illness: Melissa Santiago is a 57 y.o. who identifies as a female who was assigned female at birth, and is being seen today for itching.   Symptom onset was last summer  Initially thought is was from dry skin  She has been using Cereva lotion and anti-itch creams She has tried benadryl and Zyrtec  Has had several PCP appointments/ ED and UC for this  Has been on allergy eye drops as well     She has switched to clear detergent  Stopped using perfume Stopped makeup   Notices an increase in symptoms [place]  Has not had allergy testing   Problems:  Patient Active Problem List   Diagnosis Date Noted   Oral herpes 01/02/2023   Obesity (BMI 30.0-34.9) 11/03/2022   Alcohol abuse disorder 10/01/2022   Tinea pedis of left foot 07/02/2022   Elevated LFTs 04/09/2022   Vestibular neuritis 04/07/2022   Lymphadenopathy 01/27/2022   Overactive bladder 06/17/2021   Anxiety and depression 05/03/2021   Liver mass 05/03/2021   Morbid obesity (HCC) 05/24/2018   Gastroesophageal reflux disease 05/24/2018   Chronic constipation 05/24/2018   Osteopenia 03/23/2015   S/P bariatric surgery 06/30/2013   Lumbar disc herniation 06/29/2013   Left  sided sciatica 05/10/2013    Allergies:  Allergies  Allergen Reactions   Tolmetin     Patient reports taking OTC ibuprofen and is not allergic    Medications:  Current Outpatient Medications:    betamethasone dipropionate 0.05 % cream, Apply topically 2 (two) times daily., Disp: 30 g, Rfl: 0   buPROPion (WELLBUTRIN XL) 150 MG 24 hr tablet, TAKE 1 TABLET BY MOUTH EVERY DAY, Disp: 90 tablet, Rfl: 1   DULoxetine (CYMBALTA) 60 MG capsule, TAKE 1 CAPSULE BY  MOUTH EVERY DAY, Disp: 90 capsule, Rfl: 1   mupirocin cream (BACTROBAN) 2 %, Apply 1 Application topically 2 (two) times daily., Disp: 15 g, Rfl: 0   omeprazole (PRILOSEC) 40 MG capsule, Take 1 capsule (40 mg total) by mouth in the morning and at bedtime., Disp: 180 capsule, Rfl: 1   ondansetron (ZOFRAN) 4 MG tablet, Take 1 tablet (4 mg total) by mouth every 4 (four) hours as needed for nausea or vomiting., Disp: 12 tablet, Rfl: 0   sucralfate (CARAFATE) 1 g tablet, Take 1 tablet (1 g total) by mouth with breakfast, with lunch, and with evening meal for 7 days., Disp: 21 tablet, Rfl: 0   valACYclovir (VALTREX) 500 MG tablet, Take 1 tablet (500 mg total) by mouth daily., Disp: 90 tablet, Rfl: 1  Observations/Objective: Patient is well-developed, well-nourished in no acute distress.  Resting comfortably  at home.  Head is normocephalic, atraumatic.  No labored breathing.  Speech is clear and coherent with logical content.  Patient is alert and oriented at baseline.    Assessment and Plan: 1. Pruritus  Claritin in am Zyrtec in evening  Advised allergy consultation   Meds ordered this encounter  Medications   famotidine (PEPCID) 20 MG tablet    Sig: Take 1 tablet (20 mg total) by mouth 2 (two) times daily.    Dispense:  60 tablet    Refill:  2   olopatadine (PATADAY) 0.1 % ophthalmic solution    Sig: Place 1 drop into both eyes 2 (two) times daily.    Dispense:  5 mL    Refill:  12        Follow Up Instructions: I discussed the assessment and treatment plan with the patient. The patient was provided an opportunity to ask questions and all were answered. The patient agreed with the plan and demonstrated an understanding of the instructions.  A copy of instructions were sent to the patient via MyChart unless otherwise noted below.    The patient was advised to call back or seek an in-person evaluation if the symptoms worsen or if the condition fails to improve as  anticipated.  Time:  I spent 15 minutes with the patient via telehealth technology discussing the above problems/concerns.    Viviano Simas, FNP

## 2023-03-20 NOTE — Progress Notes (Signed)
E-Visit for Eczema  We are sorry that you are not feeling well. Here is how we plan to help! Based on what you shared with me it looks like you have eczema (atopic dermatitis).  Although the cause of eczema is not completely understood, genetics appear to play a strong role, and people with a family history of eczema are at increased risk of developing the condition. In most people with eczema, there is a genetic abnormality in the outermost layer of the skin, called the epidermis   Most people with eczema develop their first symptoms as children, before the age of 51. Intense itching of the skin, patches of redness, small bumps, and skin flaking are common. Scratching can further inflame the skin and worsen the itching. The itchiness may be more noticeable at nighttime.  Eczema commonly affects the back of the neck, the elbow creases, and the backs of the knees. Other affected areas may include the face, wrists, and forearms. The skin may become thickened and darkened, or even scarred, from repeated scratching. Eliminating factors that aggravate your eczema symptoms can help to control the symptoms. Possible triggers may include: ? Cold or dry environments ? Sweating ? Emotional stress or anxiety ? Rapid temperature changes ? Exposure to certain chemicals or cleaning solutions, including soaps and detergents, perfumes and cosmetics, wool or synthetic fibers, dust, sand, and cigarette smoke Keeping your skin hydrated Emollients -- Emollients are creams and ointments that moisturize the skin and prevent it from drying out. The best emollients for people with eczema are thick creams (such as Eucerin, Cetaphil, and Nutraderm) or ointments (such as petroleum jelly, Aquaphor, and Vaseline), which contain little to no water. Emollients are most effective when applied immediately after bathing. Emollients can be applied twice a day or more often if needed. Lotions contain more water than creams and  ointments and are less effective for moisturizing the skin. Bathing -- It is not clear if showers or baths are better for keeping the skin hydrated. Lukewarm baths or showers can hydrate and cool the skin, temporarily relieving itching from eczema. An unscented, mild soap or non-soap cleanser (such as Cetaphil) should be used sparingly. Apply an emollient immediately after bathing or showering to prevent your skin from drying out as a result of water evaporation. Emollient bath additives (products you add to the bath water) have not been found to help relieve symptoms. Hot or long baths (more than 10 to 15 minutes) and showers should be avoided since they can dry out the skin.  Based on what you shared with me you may have eczema.   Betamethasone ointment (or cream).  Apply to effected areas twice per day  Based on what you shared with me you may have a skin infection.    I have prescribed a topical antibiotic cream called Mupiricin 2%. Apply to effected areas twice per day for two weeks. The crusty lesions are the areas to apply this to, as long as the skin is intact.  Since this appears to be an on going problem, you will need to follow up with your PCP for any refills or on going needs about this.  I recommend dilute bleach baths for people with eczema. These baths help to decrease the number of bacteria on the skin that can cause infections or worsen symptoms. To prepare a bleach bath, one-fourth to one-half cup of bleach is placed in a full bathtub (about 40 gallons) of water. Bleach baths are usually taken for 5 to  10 minutes twice per week and should be followed by application of an emollient (listed above). I recommend you take Benadryl 25mg  - 50mg  every 4 hours to control the symptoms (including itching) but if they last over 24 hours it is best that you see an office based provider for follow up.  HOME CARE: Take lukewarm showers or baths Apply creams and ointments to prevent the skin  from drying (Eucerin, Cetaphil, Nutraderm, petroleum jelly, Aquaphor or Vaseline) - these products contain less water than other lotions and are more effective for moisturizing the skin Limit exposure to cold or dry environments, sweating, emotional stress and anxiety, rapid temperature changes and exposure to chemicals and cleaning products, soaps and detergents, perfumes, cosmetics, wool and synthetic fibers, dust, sand and cigarette- factors which can aggravate eczema symptoms.  Use a hydrocortisone cream once or twice a day Take an antihistamine like Benadryl for widespread rashes that itch.  The adult dosage of Benadryl is 25-50 mg by mouth 4 times daily. Caution: This type of medication may cause sleepiness.  Do not drink alcohol, drive, or operate dangerous machinery while taking antihistamines.  Do not take these medications if you have prostate enlargement.  Read the package instructions thoroughly on all medications that you take.  GET HELP RIGHT AWAY IF: Symptoms that don't go away after treatment. Severe itching that persists. You develop a fever. Your skin begins to drain. You have a sore throat. You become short of breath.  MAKE SURE YOU   Understand these instructions. Will watch your condition. Will get help right away if you are not doing well or get worse.    Thank you for choosing an e-visit.  Your e-visit answers were reviewed by a board certified advanced clinical practitioner to complete your personal care plan. Depending upon the condition, your plan could have included both over the counter or prescription medications.  Please review your pharmacy choice. Make sure the pharmacy is open so you can pick up prescription now. If there is a problem, you may contact your provider through [employer] and have the prescription routed to another pharmacy.  Your safety is important to [place]. If you have drug allergies check your prescription carefully.   For the next 24  hours you can use MyChart to ask questions about today's visit, request a non-urgent call back, or ask for a work or school excuse. You will get an email in the next two days asking about your experience. I hope that your e-visit has been valuable and will speed your recovery.    I provided 5 minutes of non face-to-face time during this encounter for chart review, medication and order placement, as well as and documentation.

## 2023-03-29 ENCOUNTER — Other Ambulatory Visit: Payer: Self-pay | Admitting: Family

## 2023-04-29 ENCOUNTER — Ambulatory Visit: Payer: BC Managed Care – PPO | Admitting: Internal Medicine

## 2023-06-16 ENCOUNTER — Other Ambulatory Visit: Payer: Self-pay | Admitting: Nurse Practitioner

## 2023-06-16 DIAGNOSIS — L299 Pruritus, unspecified: Secondary | ICD-10-CM

## 2023-06-28 ENCOUNTER — Other Ambulatory Visit: Payer: Self-pay | Admitting: Family

## 2023-07-02 ENCOUNTER — Other Ambulatory Visit: Payer: Self-pay | Admitting: Family

## 2023-07-12 DIAGNOSIS — H60501 Unspecified acute noninfective otitis externa, right ear: Secondary | ICD-10-CM | POA: Diagnosis not present

## 2023-07-12 DIAGNOSIS — M5416 Radiculopathy, lumbar region: Secondary | ICD-10-CM | POA: Diagnosis not present

## 2023-09-29 ENCOUNTER — Ambulatory Visit: Payer: BC Managed Care – PPO | Admitting: Family

## 2023-09-29 ENCOUNTER — Telehealth: Payer: BC Managed Care – PPO | Admitting: Family

## 2023-09-29 DIAGNOSIS — M5431 Sciatica, right side: Secondary | ICD-10-CM | POA: Diagnosis not present

## 2023-09-29 DIAGNOSIS — F1011 Alcohol abuse, in remission: Secondary | ICD-10-CM

## 2023-09-29 MED ORDER — METHYLPREDNISOLONE 4 MG PO TBPK
ORAL_TABLET | ORAL | 0 refills | Status: DC
Start: 2023-09-29 — End: 2023-12-15

## 2023-09-29 MED ORDER — METHOCARBAMOL 500 MG PO TABS
500.0000 mg | ORAL_TABLET | Freq: Three times a day (TID) | ORAL | 0 refills | Status: DC | PRN
Start: 2023-09-29 — End: 2024-03-04

## 2023-09-29 NOTE — Assessment & Plan Note (Addendum)
  Right-sided sciatica with pain radiating to the calf and foot, worsening over the past two months. Previous history of severe left-sided sciatica requiring hospitalization and two surgeries. Current pain is not as severe as previous left-sided sciatica, but patient is concerned about potential progression.  Patient has a history of gastric sleeve surgery.  -Continue to avoid NSAIDs when possible. If necessary, take with omeprazole to protect the stomach lining.  -Start a steroid taper to reduce inflammation and pain.  -Prescribe a muscle relaxer, as it has been helpful in the past.  -Red flags discussed:severe leg weakness, sudden inability to control bowel or bladder. If these occur, patient should go to the ER.  -If symptoms persist or worsen, consider referral for physical therapy and potential MRI (pending insurance approval).

## 2023-09-29 NOTE — Progress Notes (Deleted)
 Subjective:     Patient ID: Melissa Santiago, female    DOB: October 16, 1966, 57 y.o.   MRN: [id number]  Chief Complaint  Patient presents with   Back Pain    Complains of low back pain going down her right leg   Patient location: Home. Patient and provider in visit Provider location: Office  I discussed the limitations of evaluation and management by telemedicine and the availability of in person appointments. The patient expressed understanding and agreed to proceed.   Visit Date: 09/29/2023  Today's healthcare provider: Lemont Fillers, NP   HPI  Discussed the use of AI scribe software for clinical note transcription with the patient, who gave verbal consent to proceed.  History of Present Illness         The patient, with a history of left-sided sciatica requiring two back surgeries, now presents with right-sided sciatica. The discomfort, initially localized to the lower back, has progressed to the middle of the right calf and occasionally the right foot over the past two months. The pain is constant, but the level of discomfort varies. The patient has been managing the pain with stretches, hot and cold treatments, and occasional anti-inflammatories taken with omeprazole. However, these measures only provide temporary relief. The patient is concerned that the pain may become as debilitating as it was on the left side, which previously resulted in hospitalization.      Health Maintenance Due  Topic Date Due   HIV Screening  Never done   Hepatitis C Screening  Never done   Zoster Vaccines- Shingrix (1 of 2) Never done   DTaP/Tdap/Td (2 - Td or Tdap) 06/23/2022   MAMMOGRAM  12/04/2022   INFLUENZA VACCINE  05/28/2023   COVID-19 Vaccine (5 - 2023-24 season) 06/28/2023    Past Medical History:  Diagnosis Date   Acute lumbar radiculopathy 06/29/2013   Chest pain in adult 05/24/2018   Chronic constipation 05/24/2018   Gastroesophageal reflux disease 05/24/2018   GERD  (gastroesophageal reflux disease)    Left sided sciatica 05/10/2013   Lumbar disc herniation 06/29/2013   Morbid obesity (HCC) 05/24/2018   Obesity    Osteopenia 03/23/2015   S/P bariatric surgery 06/30/2013    Past Surgical History:  Procedure Laterality Date   BREAST BIOPSY Left 2014   CHOLECYSTECTOMY  2022   LAPAROSCOPIC GASTRIC SLEEVE RESECTION  2014   LUMBAR DISC SURGERY  2014/2016    Family History  Problem Relation Age of Onset   Arthritis Mother    Early death Mother    Mental illness Mother    Alcohol abuse Father    Arthritis Father    Early death Sister    Thyroid cancer Sister    Hypertension Sister    Heart attack Maternal Grandfather    Uterine cancer Sister    Hypertension Sister    Cervical cancer Sister    Depression Sister    Hypertension Sister    Colon cancer Neg Hx    Esophageal cancer Neg Hx    Breast cancer Neg Hx    Rectal cancer Neg Hx    Stomach cancer Neg Hx     Social History   Socioeconomic History   Marital status: Married    Spouse name: Caryn Bee   Number of children: 2   Years of education: Not on file   Highest education level: Associate degree: academic program  Occupational History   Occupation: Nature conservation officer  Tobacco Use   Smoking status: Former  Current packs/day: 0.00    Average packs/day: 0.5 packs/day for 15.0 years (7.5 ttl pk-yrs)    Types: Cigarettes    Start date: 62    Quit date: 2006    Years since quitting: 18.9   Smokeless tobacco: Never  Vaping Use   Vaping status: Never Used  Substance and Sexual Activity   Alcohol use: Yes    Comment: rarely   Drug use: Never   Sexual activity: Yes    Partners: Male    Birth control/protection: I.U.D.  Other Topics Concern   Not on file  Social History Narrative   From [place], Divorced since 2015   Works as a Research officer, political party for [employer]   2 daughters- one in the [employer]- lives in [place], one in [place]- works as a Runner, broadcasting/film/video   Enjoys spending time  with her husband   Has a dog and will adopt another soon.    Social Determinants of Health   Financial Resource Strain: Low Risk  (09/26/2023)   Overall Financial Resource Strain (CARDIA)    Difficulty of Paying Living Expenses: Not hard at all  Food Insecurity: No Food Insecurity (09/26/2023)   Hunger Vital Sign    Worried About Running Out of Food in the Last Year: Never true    Ran Out of Food in the Last Year: Never true  Transportation Needs: No Transportation Needs (09/26/2023)   PRAPARE - Administrator, Civil Service (Medical): No    Lack of Transportation (Non-Medical): No  Physical Activity: Unknown (09/26/2023)   Exercise Vital Sign    Days of Exercise per Week: 0 days    Minutes of Exercise per Session: Not on file  Stress: Stress Concern Present (09/26/2023)   [employer] of Occupational Health - Occupational Stress Questionnaire    Feeling of Stress : Rather much  Social Connections: Socially Integrated (09/26/2023)   Social Connection and Isolation Panel [NHANES]    Frequency of Communication with Friends and Family: More than three times a week    Frequency of Social Gatherings with Friends and Family: Once a week    Attends Religious Services: More than 4 times per year    Active Member of [employer] or Organizations: Yes    Attends Banker Meetings: More than 4 times per year    Marital Status: Married  Catering manager Violence: Unknown (04/04/2023)   Received from [facility]   HITS    Physically Hurt: Not on file    Insult or Talk Down To: Not on file    Threaten Physical Harm: Not on file    Scream or Curse: Not on file    Outpatient Medications Prior to Visit  Medication Sig Dispense Refill   betamethasone dipropionate 0.05 % cream Apply topically 2 (two) times daily. 30 g 0   buPROPion (WELLBUTRIN XL) 150 MG 24 hr tablet TAKE 1 TABLET BY MOUTH EVERY DAY 90 tablet 1   DULoxetine (CYMBALTA) 60 MG capsule TAKE 1 CAPSULE BY  MOUTH EVERY DAY 90 capsule 1   hydrOXYzine (VISTARIL) 25 MG capsule Take 1 capsule (25 mg total) by mouth every 8 (eight) hours as needed. 30 capsule 0   mupirocin cream (BACTROBAN) 2 % Apply 1 Application topically 2 (two) times daily. 15 g 0   omeprazole (PRILOSEC) 40 MG capsule Take 1 capsule (40 mg total) by mouth in the morning and at bedtime. 180 capsule 1   ondansetron (ZOFRAN) 4 MG tablet Take 1  tablet (4 mg total) by mouth every 4 (four) hours as needed for nausea or vomiting. 12 tablet 0   sucralfate (CARAFATE) 1 g tablet Take 1 tablet (1 g total) by mouth with breakfast, with lunch, and with evening meal for 7 days. 21 tablet 0   valACYclovir (VALTREX) 500 MG tablet TAKE 1 TABLET (500 MG TOTAL) BY MOUTH DAILY. 90 tablet 1   famotidine (PEPCID) 20 MG tablet Take 1 tablet (20 mg total) by mouth 2 (two) times daily. 60 tablet 2   No facility-administered medications prior to visit.    Allergies  Allergen Reactions   Tolmetin     Patient reports taking OTC ibuprofen and is not allergic     ROS See HPI    Objective:    Physical Exam   There were no vitals taken for this visit. Wt Readings from Last 3 Encounters:  02/25/23 215 lb (97.5 kg)  11/03/22 210 lb (95.3 kg)  10/01/22 213 lb (96.6 kg)    Gen: Awake, alert, no acute distress Resp: Breathing is even and non-labored Psych: calm/pleasant demeanor Neuro: Alert and Oriented x 3, + facial symmetry, speech is clear.      Assessment & Plan:   Problem List Items Addressed This Visit       Unprioritized   Sciatica of right side - Primary     Right-sided sciatica with pain radiating to the calf and foot, worsening over the past two months. Previous history of severe left-sided sciatica requiring hospitalization and two surgeries. Current pain is not as severe as previous left-sided sciatica, but patient is concerned about potential progression.  Patient has a history of gastric sleeve surgery.  -Continue to avoid  NSAIDs when possible. If necessary, take with omeprazole to protect the stomach lining.  -Start a steroid taper to reduce inflammation and pain.  -Prescribe a muscle relaxer, as it has been helpful in the past.  -Red flags discussed:severe leg weakness, sudden inability to control bowel or bladder. If these occur, patient should go to the ER.  -If symptoms persist or worsen, consider referral for physical therapy and potential MRI (pending insurance approval).        Relevant Medications   methylPREDNISolone (MEDROL DOSEPAK) 4 MG TBPK tablet   methocarbamol (ROBAXIN) 500 MG tablet   History of alcohol abuse     Patient reports continued sobriety. -Congratulate patient on maintaining sobriety.        I am having Tresa Endo E. Moat "[employer]" start on methylPREDNISolone and methocarbamol. I am also having her maintain her omeprazole, sucralfate, ondansetron, betamethasone dipropionate, mupirocin cream, famotidine, hydrOXYzine, buPROPion, valACYclovir, and DULoxetine.  Meds ordered this encounter  Medications   methylPREDNISolone (MEDROL DOSEPAK) 4 MG TBPK tablet    Sig: Please take per package instructions    Dispense:  21 tablet    Refill:  0    Order Specific Question:   Supervising Provider    Answer:   Danise Edge A [[id number]]   methocarbamol (ROBAXIN) 500 MG tablet    Sig: Take 1 tablet (500 mg total) by mouth every 8 (eight) hours as needed.    Dispense:  20 tablet    Refill:  0    Order Specific Question:   Supervising Provider    Answer:   Danise Edge A [[id number]]     I discussed the assessment and treatment plan with the patient. The patient was provided an opportunity to ask questions and all were answered. The patient agreed with the  plan and demonstrated an understanding of the instructions.   The patient was advised to call back or seek an in-person evaluation if the symptoms worsen or if the condition fails to improve as anticipated.     Lemont Fillers,  NP [facility] [phone] (phone) [phone] (fax)   [facility]

## 2023-09-29 NOTE — Assessment & Plan Note (Signed)
  Patient reports continued sobriety. -Congratulate patient on maintaining sobriety.

## 2023-09-29 NOTE — Patient Instructions (Signed)
VISIT SUMMARY:  During today's visit, we discussed your right-sided sciatica, which has been causing you discomfort from your lower back to your calf and occasionally your foot over the past two months. We also reviewed your continued sobriety and your history of gastric sleeve surgery.  YOUR PLAN:  -SCIATICA: Sciatica is pain that radiates along the path of the sciatic nerve, which runs from your lower back through your hips and down each leg. We will start you on a steroid taper to reduce inflammation and pain, and prescribe a muscle relaxer, as it has been helpful for you in the past. Be aware of severe leg weakness or sudden inability to control your bowel or bladder, and go to the ER if these occur. If your symptoms persist or worsen, we may refer you for physical therapy and consider an MRI, pending insurance approval.  -SOBRIETY: Congratulations on maintaining your sobriety. This is an important achievement for your overall health and well-being.  -GASTRIC SLEEVE: Given your history of gastric sleeve surgery, it is important to avoid NSAIDs when possible. If you must take them, continue to use omeprazole to protect your stomach lining.  INSTRUCTIONS:  Please check in with [place] after starting the steroid taper and muscle relaxer to assess your response to the treatment. If your symptoms persist or worsen, we may need to refer you for physical therapy and consider an MRI, pending insurance approval.

## 2023-12-15 ENCOUNTER — Telehealth (INDEPENDENT_AMBULATORY_CARE_PROVIDER_SITE_OTHER): Payer: BC Managed Care – PPO | Admitting: Family

## 2023-12-15 ENCOUNTER — Ambulatory Visit: Payer: BC Managed Care – PPO | Admitting: Family

## 2023-12-15 DIAGNOSIS — L304 Erythema intertrigo: Secondary | ICD-10-CM

## 2023-12-15 DIAGNOSIS — F1011 Alcohol abuse, in remission: Secondary | ICD-10-CM | POA: Diagnosis not present

## 2023-12-15 MED ORDER — NYSTATIN 100000 UNIT/GM EX POWD: CUTANEOUS | 1 refills | Status: AC

## 2023-12-15 MED ORDER — FLUCONAZOLE 150 MG PO TABS
ORAL_TABLET | ORAL | 0 refills | Status: DC
Start: 2023-12-15 — End: 2024-03-04

## 2023-12-15 NOTE — Progress Notes (Deleted)
 Subjective:     Patient ID: Melissa Santiago, female    DOB: [birth year], 58 y.o.   MRN: [id number]  Chief Complaint  Patient presents with   Skin irritation    Patient reports skin irritation and itching at bottom of abdomen.     HPI  Discussed the use of AI scribe software for clinical note transcription with the patient, who gave verbal consent to proceed.  History of Present Illness              Health Maintenance Due  Topic Date Due   HIV Screening  Never done   Hepatitis C Screening  Never done   Zoster Vaccines- Shingrix (1 of 2) Never done   DTaP/Tdap/Td (2 - Td or Tdap) 06/23/2022   MAMMOGRAM  12/04/2022   INFLUENZA VACCINE  05/28/2023   COVID-19 Vaccine (5 - 2024-25 season) 06/28/2023   Cervical Cancer Screening (HPV/Pap Cotest)  12/15/2023    Past Medical History:  Diagnosis Date   Acute lumbar radiculopathy 06/29/2013   Chest pain in adult 05/24/2018   Chronic constipation 05/24/2018   Gastroesophageal reflux disease 05/24/2018   GERD (gastroesophageal reflux disease)    Left sided sciatica 05/10/2013   Lumbar disc herniation 06/29/2013   Morbid obesity (HCC) 05/24/2018   Obesity    Osteopenia 03/23/2015   S/P bariatric surgery 06/30/2013    Past Surgical History:  Procedure Laterality Date   BREAST BIOPSY Left 2014   CHOLECYSTECTOMY  2022   LAPAROSCOPIC GASTRIC SLEEVE RESECTION  2014   LUMBAR DISC SURGERY  2014/2016    Family History  Problem Relation Age of Onset   Arthritis Mother    Early death Mother    Mental illness Mother    Alcohol abuse Father    Arthritis Father    Early death Sister    Thyroid cancer Sister    Hypertension Sister    Heart attack Maternal Grandfather    Uterine cancer Sister    Hypertension Sister    Cervical cancer Sister    Depression Sister    Hypertension Sister    Colon cancer Neg Hx    Esophageal cancer Neg Hx    Breast cancer Neg Hx    Rectal cancer Neg Hx    Stomach cancer Neg Hx     Social History    Socioeconomic History   Marital status: Married    Spouse name: Caryn Bee   Number of children: 2   Years of education: Not on file   Highest education level: Associate degree: academic program  Occupational History   Occupation: Nature conservation officer  Tobacco Use   Smoking status: Former    Current packs/day: 0.00    Average packs/day: 0.5 packs/day for 15.0 years (7.5 ttl pk-yrs)    Types: Cigarettes    Start date: 72    Quit date: 2006    Years since quitting: 19.1   Smokeless tobacco: Never  Vaping Use   Vaping status: Never Used  Substance and Sexual Activity   Alcohol use: Yes    Comment: rarely   Drug use: Never   Sexual activity: Yes    Partners: Male    Birth control/protection: I.U.D.  Other Topics Concern   Not on file  Social History Narrative   From [place], Divorced since 2015   Works as a Research officer, political party for [employer]   2 daughters- one in the [employer]- lives in [place], one in [place]- works as a Runner, broadcasting/film/video  Enjoys spending time with her husband   Has a dog and will adopt another soon.    Social Drivers of Corporate investment banker Strain: Low Risk  (09/26/2023)   Overall Financial Resource Strain (CARDIA)    Difficulty of Paying Living Expenses: Not hard at all  Food Insecurity: No Food Insecurity (09/26/2023)   Hunger Vital Sign    Worried About Running Out of Food in the Last Year: Never true    Ran Out of Food in the Last Year: Never true  Transportation Needs: No Transportation Needs (09/26/2023)   PRAPARE - Administrator, Civil Service (Medical): No    Lack of Transportation (Non-Medical): No  Physical Activity: Unknown (09/26/2023)   Exercise Vital Sign    Days of Exercise per Week: 0 days    Minutes of Exercise per Session: Not on file  Stress: Stress Concern Present (09/26/2023)   [employer] of Occupational Health - Occupational Stress Questionnaire    Feeling of Stress : Rather much  Social Connections:  Socially Integrated (09/26/2023)   Social Connection and Isolation Panel [NHANES]    Frequency of Communication with Friends and Family: More than three times a week    Frequency of Social Gatherings with Friends and Family: Once a week    Attends Religious Services: More than 4 times per year    Active Member of [employer] or Organizations: Yes    Attends Banker Meetings: More than 4 times per year    Marital Status: Married  Catering manager Violence: Unknown (04/04/2023)   Received from [facility]   HITS    Physically Hurt: Not on file    Insult or Talk Down To: Not on file    Threaten Physical Harm: Not on file    Scream or Curse: Not on file    Outpatient Medications Prior to Visit  Medication Sig Dispense Refill   betamethasone dipropionate 0.05 % cream Apply topically 2 (two) times daily. 30 g 0   buPROPion (WELLBUTRIN XL) 150 MG 24 hr tablet TAKE 1 TABLET BY MOUTH EVERY DAY 90 tablet 1   DULoxetine (CYMBALTA) 60 MG capsule TAKE 1 CAPSULE BY MOUTH EVERY DAY 90 capsule 1   hydrOXYzine (VISTARIL) 25 MG capsule Take 1 capsule (25 mg total) by mouth every 8 (eight) hours as needed. 30 capsule 0   methocarbamol (ROBAXIN) 500 MG tablet Take 1 tablet (500 mg total) by mouth every 8 (eight) hours as needed. 20 tablet 0   mupirocin cream (BACTROBAN) 2 % Apply 1 Application topically 2 (two) times daily. 15 g 0   omeprazole (PRILOSEC) 40 MG capsule Take 1 capsule (40 mg total) by mouth in the morning and at bedtime. 180 capsule 1   sucralfate (CARAFATE) 1 g tablet Take 1 tablet (1 g total) by mouth with breakfast, with lunch, and with evening meal for 7 days. 21 tablet 0   valACYclovir (VALTREX) 500 MG tablet TAKE 1 TABLET (500 MG TOTAL) BY MOUTH DAILY. 90 tablet 1   methylPREDNISolone (MEDROL DOSEPAK) 4 MG TBPK tablet Please take per package instructions 21 tablet 0   ondansetron (ZOFRAN) 4 MG tablet Take 1 tablet (4 mg total) by mouth every 4 (four) hours as needed for nausea or  vomiting. 12 tablet 0   famotidine (PEPCID) 20 MG tablet Take 1 tablet (20 mg total) by mouth 2 (two) times daily. 60 tablet 2   No facility-administered medications prior to visit.    Allergies  Allergen Reactions   Tolmetin     Patient reports taking OTC ibuprofen and is not allergic     ROS     Objective:    Physical Exam   There were no vitals taken for this visit. Wt Readings from Last 3 Encounters:  02/25/23 215 lb (97.5 kg)  11/03/22 210 lb (95.3 kg)  10/01/22 213 lb (96.6 kg)       Assessment & Plan:   Problem List Items Addressed This Visit   None   I have discontinued Tresa Endo E. Dario "Saadiya Mella"'s ondansetron and methylPREDNISolone. I am also having her maintain her omeprazole, sucralfate, betamethasone dipropionate, mupirocin cream, famotidine, hydrOXYzine, buPROPion, valACYclovir, DULoxetine, and methocarbamol.  No orders of the defined types were placed in this encounter.

## 2023-12-17 DIAGNOSIS — L304 Erythema intertrigo: Secondary | ICD-10-CM | POA: Insufficient documentation

## 2023-12-17 NOTE — Addendum Note (Signed)
 Addended by: Sandford Craze on: 12/17/2023 01:24 PM   Modules accepted: Level of Service

## 2023-12-17 NOTE — Assessment & Plan Note (Signed)
  Chronic rash in the abdominal skin fold, likely worsened by heat and obesity. Discussed the importance of keeping the area dry and the use of antifungal treatments. -Prescribe Diflucan and antifungal powder. -Advise to dry the area thoroughly after showering and apply antifungal powder daily.  Panniculectomy Consult Discussed the potential benefits of surgical intervention for recurrent skin infections and back pain. Patient expressed interest in a consultation. -Refer to a plastic surgeon for a consultation.

## 2023-12-17 NOTE — Progress Notes (Addendum)
 MyChart Video Visit    Virtual Visit via Video Note    Patient location: Home. Patient and provider in visit Provider location: Office  I discussed the limitations of evaluation and management by telemedicine and the availability of in person appointments. The patient expressed understanding and agreed to proceed.  Visit Date: 12/15/2023  Today's healthcare provider: Lemont Fillers, NP     Subjective:    Patient ID: Melissa Santiago, female    DOB: Mar 12, 1966, 58 y.o.   MRN: [id number]  Chief Complaint  Patient presents with   Skin irritation    Patient reports skin irritation and itching at bottom of abdomen.     HPI  Melissa Santiago "Melissa Santiago" is a 58 year old female who presents with a rash under her abdominal skin fold. She has had this issue for years but has been too embarrassed to address it.  Rash is described as 'super raw' and embarrassing, which worsens in hot weather. She has tried various creams, including Cerva cream, but feels they have exacerbated the condition.  She has experienced significant weight loss over her adult life, previously weighing 300 pounds and now approximately 205 pounds. Her bariatric surgeon had advised that the rash might need surgical intervention at some time.   She has a history of severe lower back issues, having undergone two back surgeries.   She is maintaining sobriety and attends sessions with her pastor every two weeks, as well as participating in a program called [facility].  Past Medical History:  Diagnosis Date   Acute lumbar radiculopathy 06/29/2013   Chest pain in adult 05/24/2018   Chronic constipation 05/24/2018   Gastroesophageal reflux disease 05/24/2018   GERD (gastroesophageal reflux disease)    Left sided sciatica 05/10/2013   Lumbar disc herniation 06/29/2013   Morbid obesity (HCC) 05/24/2018   Obesity    Osteopenia 03/23/2015   S/P bariatric surgery 06/30/2013    Past Surgical History:   Procedure Laterality Date   BREAST BIOPSY Left 2014   CHOLECYSTECTOMY  2022   LAPAROSCOPIC GASTRIC SLEEVE RESECTION  2014   LUMBAR DISC SURGERY  2014/2016    Family History  Problem Relation Age of Onset   Arthritis Mother    Early death Mother    Mental illness Mother    Alcohol abuse Father    Arthritis Father    Early death Sister    Thyroid cancer Sister    Hypertension Sister    Heart attack Maternal Grandfather    Uterine cancer Sister    Hypertension Sister    Cervical cancer Sister    Depression Sister    Hypertension Sister    Colon cancer Neg Hx    Esophageal cancer Neg Hx    Breast cancer Neg Hx    Rectal cancer Neg Hx    Stomach cancer Neg Hx     Social History   Socioeconomic History   Marital status: Married    Spouse name: Melissa Santiago   Number of children: 2   Years of education: Not on file   Highest education level: Associate degree: academic program  Occupational History   Occupation: Nature conservation officer  Tobacco Use   Smoking status: Former    Current packs/day: 0.00    Average packs/day: 0.5 packs/day for 15.0 years (7.5 ttl pk-yrs)    Types: Cigarettes    Start date: 25    Quit date: 2006    Years since quitting: 19.1   Smokeless tobacco: Never  Vaping Use   Vaping status: Never Used  Substance and Sexual Activity   Alcohol use: Yes    Comment: rarely   Drug use: Never   Sexual activity: Yes    Partners: Male    Birth control/protection: I.U.D.  Other Topics Concern   Not on file  Social History Narrative   From [place], Divorced since 2015   Works as a Research officer, political party for [employer]   2 daughters- one in the [employer]- lives in [place], one in [place]- works as a Runner, broadcasting/film/video   Enjoys spending time with her husband   Has a dog and will adopt another soon.    Social Drivers of Corporate investment banker Strain: Low Risk  (09/26/2023)   Overall Financial Resource Strain (CARDIA)    Difficulty of Paying Living Expenses: Not hard  at all  Food Insecurity: No Food Insecurity (09/26/2023)   Hunger Vital Sign    Worried About Running Out of Food in the Last Year: Never true    Ran Out of Food in the Last Year: Never true  Transportation Needs: No Transportation Needs (09/26/2023)   PRAPARE - Administrator, Civil Service (Medical): No    Lack of Transportation (Non-Medical): No  Physical Activity: Unknown (09/26/2023)   Exercise Vital Sign    Days of Exercise per Week: 0 days    Minutes of Exercise per Session: Not on file  Stress: Stress Concern Present (09/26/2023)   [employer] of Occupational Health - Occupational Stress Questionnaire    Feeling of Stress : Rather much  Social Connections: Socially Integrated (09/26/2023)   Social Connection and Isolation Panel [NHANES]    Frequency of Communication with Friends and Family: More than three times a week    Frequency of Social Gatherings with Friends and Family: Once a week    Attends Religious Services: More than 4 times per year    Active Member of [employer] or Organizations: Yes    Attends Banker Meetings: More than 4 times per year    Marital Status: Married  Catering manager Violence: Unknown (04/04/2023)   Received from [facility]   HITS    Physically Hurt: Not on file    Insult or Talk Down To: Not on file    Threaten Physical Harm: Not on file    Scream or Curse: Not on file    Outpatient Medications Prior to Visit  Medication Sig Dispense Refill   betamethasone dipropionate 0.05 % cream Apply topically 2 (two) times daily. 30 g 0   buPROPion (WELLBUTRIN XL) 150 MG 24 hr tablet TAKE 1 TABLET BY MOUTH EVERY DAY 90 tablet 1   DULoxetine (CYMBALTA) 60 MG capsule TAKE 1 CAPSULE BY MOUTH EVERY DAY 90 capsule 1   hydrOXYzine (VISTARIL) 25 MG capsule Take 1 capsule (25 mg total) by mouth every 8 (eight) hours as needed. 30 capsule 0   methocarbamol (ROBAXIN) 500 MG tablet Take 1 tablet (500 mg total) by mouth every 8 (eight)  hours as needed. 20 tablet 0   mupirocin cream (BACTROBAN) 2 % Apply 1 Application topically 2 (two) times daily. 15 g 0   omeprazole (PRILOSEC) 40 MG capsule Take 1 capsule (40 mg total) by mouth in the morning and at bedtime. 180 capsule 1   sucralfate (CARAFATE) 1 g tablet Take 1 tablet (1 g total) by mouth with breakfast, with lunch, and with evening meal for 7 days. 21 tablet 0  valACYclovir (VALTREX) 500 MG tablet TAKE 1 TABLET (500 MG TOTAL) BY MOUTH DAILY. 90 tablet 1   methylPREDNISolone (MEDROL DOSEPAK) 4 MG TBPK tablet Please take per package instructions 21 tablet 0   ondansetron (ZOFRAN) 4 MG tablet Take 1 tablet (4 mg total) by mouth every 4 (four) hours as needed for nausea or vomiting. 12 tablet 0   famotidine (PEPCID) 20 MG tablet Take 1 tablet (20 mg total) by mouth 2 (two) times daily. 60 tablet 2   No facility-administered medications prior to visit.    Allergies  Allergen Reactions   Tolmetin     Patient reports taking OTC ibuprofen and is not allergic     ROS See HPI    Objective:    Physical Exam Constitutional:      General: She is not in acute distress.    Appearance: Normal appearance. She is well-developed.  HENT:     Head: Normocephalic and atraumatic.     Right Ear: External ear normal.     Left Ear: External ear normal.  Eyes:     General: No scleral icterus. Neck:     Thyroid: No thyromegaly.  Pulmonary:     Effort: Pulmonary effort is normal.  Skin:    Comments: Unable to examine abdominal rash on video  Neurological:     Mental Status: She is alert and oriented to person, place, and time.  Psychiatric:        Mood and Affect: Mood normal.        Behavior: Behavior normal.        Thought Content: Thought content normal.        Judgment: Judgment normal.     There were no vitals taken for this visit. Wt Readings from Last 3 Encounters:  02/25/23 215 lb (97.5 kg)  11/03/22 210 lb (95.3 kg)  10/01/22 213 lb (96.6 kg)        Assessment & Plan:   Problem List Items Addressed This Visit       Unprioritized   Intertrigo - Primary    Chronic rash in the abdominal skin fold, likely worsened by heat and obesity. Discussed the importance of keeping the area dry and the use of antifungal treatments. -Prescribe Diflucan and antifungal powder. -Advise to dry the area thoroughly after showering and apply antifungal powder daily.  Panniculectomy Consult Discussed the potential benefits of surgical intervention for recurrent skin infections and back pain. Patient expressed interest in a consultation. -Refer to a plastic surgeon for a consultation.      Relevant Medications   fluconazole (DIFLUCAN) 150 MG tablet   nystatin powder   Other Relevant Orders   Ambulatory referral to Plastic Surgery    I have discontinued Tresa Endo E. Arnone "Merina Hecht"'s ondansetron and methylPREDNISolone. I am also having her start on fluconazole and nystatin. Additionally, I am having her maintain her omeprazole, sucralfate, betamethasone dipropionate, mupirocin cream, famotidine, hydrOXYzine, buPROPion, valACYclovir, DULoxetine, and methocarbamol.  Meds ordered this encounter  Medications   fluconazole (DIFLUCAN) 150 MG tablet    Sig: Take 1 tablet by mouth today and repeat 1 tablet by mouth in 7 days    Dispense:  2 tablet    Refill:  0    Supervising Provider:   Danise Edge A [[id number]]   nystatin powder    Sig: Apply twice daily as needed to skin fold    Dispense:  60 g    Refill:  1    Supervising Provider:   Abner Greenspan,  STACEY A [[id number]]    I discussed the assessment and treatment plan with the patient. The patient was provided an opportunity to ask questions and all were answered. The patient agreed with the plan and demonstrated an understanding of the instructions.   The patient was advised to call back or seek an in-person evaluation if the symptoms worsen or if the condition fails to improve as anticipated. Lemont Fillers, NP [facility] [phone] (phone) [phone] (fax)  [facility]

## 2023-12-17 NOTE — Patient Instructions (Signed)
 VISIT SUMMARY:  Today, we discussed the persistent rash under your belly, which has been particularly troublesome in hot weather. We also talked about your significant weight loss and the potential need for surgical intervention. Additionally, we reviewed your ongoing sobriety and participation in supportive programs.  YOUR PLAN:  -INTERTRIGO: Intertrigo is a rash that occurs in skin folds, often worsened by heat and moisture. To manage this, you should keep the area dry and use antifungal treatments. I have prescribed Diflucan and an antifungal powder. Please dry the area thoroughly after showering and apply the antifungal powder daily.  -PANNICULECTOMY CONSULT: A panniculectomy is a surgical procedure to remove excess skin and tissue, which can help with recurrent skin infections and back pain. Given your interest, I am referring you to a plastic surgeon for a consultation.  -SOBRIETY: Maintaining sobriety is crucial for your overall health. I encourage you to continue participating in your supportive community programs, including your sessions with your pastor and [facility].  INSTRUCTIONS:  If the rash does not improve, please return to the office for further evaluation.

## 2023-12-17 NOTE — Assessment & Plan Note (Signed)
  Patient reports ongoing sobriety and participation in supportive community programs. -Encourage continued participation in supportive programs.

## 2023-12-22 NOTE — Progress Notes (Addendum)
 Virtual telephone visit    Virtual Visit via Telephone Note      Patient location: Home. Patient and provider in visit Provider location: Office  I discussed the limitations of evaluation and management by telemedicine and the availability of in person appointments. The patient expressed understanding and agreed to proceed.   Visit Date: 09/29/2023  Today's healthcare provider: Lemont Fillers, NP     Subjective:    Patient ID: Melissa Santiago, female    DOB: 1966/02/27, 58 y.o.   MRN: [id number]  Chief Complaint  Patient presents with   Back Pain    Complains of low back pain going down her right leg    HPI  Past Medical History:  Diagnosis Date   Acute lumbar radiculopathy 06/29/2013   Chest pain in adult 05/24/2018   Chronic constipation 05/24/2018   Gastroesophageal reflux disease 05/24/2018   GERD (gastroesophageal reflux disease)    Left sided sciatica 05/10/2013   Lumbar disc herniation 06/29/2013   Morbid obesity (HCC) 05/24/2018   Obesity    Osteopenia 03/23/2015   S/P bariatric surgery 06/30/2013    Past Surgical History:  Procedure Laterality Date   BREAST BIOPSY Left 2014   CHOLECYSTECTOMY  2022   LAPAROSCOPIC GASTRIC SLEEVE RESECTION  2014   LUMBAR DISC SURGERY  2014/2016    Family History  Problem Relation Age of Onset   Arthritis Mother    Early death Mother    Mental illness Mother    Alcohol abuse Father    Arthritis Father    Early death Sister    Thyroid cancer Sister    Hypertension Sister    Heart attack Maternal Grandfather    Uterine cancer Sister    Hypertension Sister    Cervical cancer Sister    Depression Sister    Hypertension Sister    Colon cancer Neg Hx    Esophageal cancer Neg Hx    Breast cancer Neg Hx    Rectal cancer Neg Hx    Stomach cancer Neg Hx     Social History   Socioeconomic History   Marital status: Married    Spouse name: Caryn Bee   Number of children: 2   Years of education: Not on file    Highest education level: Associate degree: academic program  Occupational History   Occupation: Nature conservation officer  Tobacco Use   Smoking status: Former    Current packs/day: 0.00    Average packs/day: 0.5 packs/day for 15.0 years (7.5 ttl pk-yrs)    Types: Cigarettes    Start date: 53    Quit date: 2006    Years since quitting: 19.1   Smokeless tobacco: Never  Vaping Use   Vaping status: Never Used  Substance and Sexual Activity   Alcohol use: Yes    Comment: rarely   Drug use: Never   Sexual activity: Yes    Partners: Male    Birth control/protection: I.U.D.  Other Topics Concern   Not on file  Social History Narrative   From [place], Divorced since 2015   Works as a Research officer, political party for [employer]   2 daughters- one in the [employer]- lives in [place], one in [place]- works as a Runner, broadcasting/film/video   Enjoys spending time with her husband   Has a dog and will adopt another soon.    Social Drivers of Health   Financial Resource Strain: Low Risk  (09/26/2023)   Overall Financial Resource Strain (CARDIA)  Difficulty of Paying Living Expenses: Not hard at all  Food Insecurity: No Food Insecurity (09/26/2023)   Hunger Vital Sign    Worried About Running Out of Food in the Last Year: Never true    Ran Out of Food in the Last Year: Never true  Transportation Needs: No Transportation Needs (09/26/2023)   PRAPARE - Administrator, Civil Service (Medical): No    Lack of Transportation (Non-Medical): No  Physical Activity: Unknown (09/26/2023)   Exercise Vital Sign    Days of Exercise per Week: 0 days    Minutes of Exercise per Session: Not on file  Stress: Stress Concern Present (09/26/2023)   [employer] of Occupational Health - Occupational Stress Questionnaire    Feeling of Stress : Rather much  Social Connections: Socially Integrated (09/26/2023)   Social Connection and Isolation Panel [NHANES]    Frequency of Communication with Friends and Family: More  than three times a week    Frequency of Social Gatherings with Friends and Family: Once a week    Attends Religious Services: More than 4 times per year    Active Member of [employer] or Organizations: Yes    Attends Banker Meetings: More than 4 times per year    Marital Status: Married  Catering manager Violence: Unknown (04/04/2023)   Received from [facility]   HITS    Physically Hurt: Not on file    Insult or Talk Down To: Not on file    Threaten Physical Harm: Not on file    Scream or Curse: Not on file    Outpatient Medications Prior to Visit  Medication Sig Dispense Refill   betamethasone dipropionate 0.05 % cream Apply topically 2 (two) times daily. 30 g 0   buPROPion (WELLBUTRIN XL) 150 MG 24 hr tablet TAKE 1 TABLET BY MOUTH EVERY DAY 90 tablet 1   DULoxetine (CYMBALTA) 60 MG capsule TAKE 1 CAPSULE BY MOUTH EVERY DAY 90 capsule 1   hydrOXYzine (VISTARIL) 25 MG capsule Take 1 capsule (25 mg total) by mouth every 8 (eight) hours as needed. 30 capsule 0   mupirocin cream (BACTROBAN) 2 % Apply 1 Application topically 2 (two) times daily. 15 g 0   omeprazole (PRILOSEC) 40 MG capsule Take 1 capsule (40 mg total) by mouth in the morning and at bedtime. 180 capsule 1   sucralfate (CARAFATE) 1 g tablet Take 1 tablet (1 g total) by mouth with breakfast, with lunch, and with evening meal for 7 days. 21 tablet 0   valACYclovir (VALTREX) 500 MG tablet TAKE 1 TABLET (500 MG TOTAL) BY MOUTH DAILY. 90 tablet 1   ondansetron (ZOFRAN) 4 MG tablet Take 1 tablet (4 mg total) by mouth every 4 (four) hours as needed for nausea or vomiting. 12 tablet 0   famotidine (PEPCID) 20 MG tablet Take 1 tablet (20 mg total) by mouth 2 (two) times daily. 60 tablet 2   No facility-administered medications prior to visit.    Allergies  Allergen Reactions   Tolmetin     Patient reports taking OTC ibuprofen and is not allergic     ROS     Objective:    Physical Exam  There were no vitals  taken for this visit. Wt Readings from Last 3 Encounters:  02/25/23 215 lb (97.5 kg)  11/03/22 210 lb (95.3 kg)  10/01/22 213 lb (96.6 kg)        Assessment & Plan:   Problem List Items Addressed This  Visit       Unprioritized   Sciatica of right side - Primary    Right-sided sciatica with pain radiating to the calf and foot, worsening over the past two months. Previous history of severe left-sided sciatica requiring hospitalization and two surgeries. Current pain is not as severe as previous left-sided sciatica, but patient is concerned about potential progression.  Patient has a history of gastric sleeve surgery.  -Continue to avoid NSAIDs when possible. If necessary, take with omeprazole to protect the stomach lining.  -Start a steroid taper to reduce inflammation and pain.  -Prescribe a muscle relaxer, as it has been helpful in the past.  -Red flags discussed:severe leg weakness, sudden inability to control bowel or bladder. If these occur, patient should go to the ER.  -If symptoms persist or worsen, consider referral for physical therapy and potential MRI (pending insurance approval).        Relevant Medications   methocarbamol (ROBAXIN) 500 MG tablet   History of alcohol abuse    Patient reports continued sobriety. -Congratulate patient on maintaining sobriety.        I am having Tresa Endo E. Chilton "[employer]" start on methocarbamol. I am also having her maintain her omeprazole, sucralfate, betamethasone dipropionate, mupirocin cream, famotidine, hydrOXYzine, buPROPion, valACYclovir, and DULoxetine.  Meds ordered this encounter  Medications   DISCONTD: methylPREDNISolone (MEDROL DOSEPAK) 4 MG TBPK tablet    Sig: Please take per package instructions    Dispense:  21 tablet    Refill:  0    Supervising Provider:   Danise Edge A [[id number]]   methocarbamol (ROBAXIN) 500 MG tablet    Sig: Take 1 tablet (500 mg total) by mouth every 8 (eight) hours as needed.     Dispense:  20 tablet    Refill:  0    Supervising Provider:   Danise Edge A [[id number]]     I discussed the assessment and treatment plan with the patient. The patient was provided an opportunity to ask questions and all were answered. The patient agreed with the plan and demonstrated an understanding of the instructions.   The patient was advised to call back or seek an in-person evaluation if the symptoms worsen or if the condition fails to improve as anticipated.   Lemont Fillers, NP [facility] [phone] ([phone] (fax)  [facility]

## 2023-12-24 ENCOUNTER — Other Ambulatory Visit: Payer: Self-pay | Admitting: Family

## 2024-01-13 ENCOUNTER — Ambulatory Visit: Payer: BC Managed Care – PPO | Admitting: Plastic Surgery

## 2024-01-13 ENCOUNTER — Encounter: Payer: Self-pay | Admitting: Plastic Surgery

## 2024-01-13 VITALS — BP 94/66 | HR 112 | Ht 64.5 in | Wt 204.8 lb

## 2024-01-13 DIAGNOSIS — E65 Localized adiposity: Secondary | ICD-10-CM

## 2024-01-13 DIAGNOSIS — Z9884 Bariatric surgery status: Secondary | ICD-10-CM

## 2024-01-13 DIAGNOSIS — M793 Panniculitis, unspecified: Secondary | ICD-10-CM | POA: Diagnosis not present

## 2024-01-13 NOTE — Progress Notes (Signed)
 Referring Provider Melissa Craze, NP [id number] [place],  [place] [address]   CC:  Chief Complaint  Patient presents with   Advice Only      Melissa Santiago is an 57 y.o. female.  HPI: Melissa Santiago is a 58 year old female who underwent bariatric surgery in 2014 with a revision recently.  She has had an 80 pound weight loss.  She presents today with complaints of ongoing rashes on the posterior aspect of her pannus as well as interference with her daily activities including but not limited to bending over to tie her shoes picking up items from the floor maintaining balance when standing.  She finds it difficult to find close that fit appropriately.  Additionally she has had longstanding problems with her back and is required lumbar discectomy twice.  She now has pain in her back again and is curious as to whether a panniculectomy may improve this pain.  Allergies  Allergen Reactions   Tolmetin     Patient reports taking OTC ibuprofen and is not allergic     Outpatient Encounter Medications as of 01/13/2024  Medication Sig   betamethasone dipropionate 0.05 % cream Apply topically 2 (two) times daily.   buPROPion (WELLBUTRIN XL) 150 MG 24 hr tablet TAKE 1 TABLET BY MOUTH EVERY DAY   DULoxetine (CYMBALTA) 60 MG capsule TAKE 1 CAPSULE BY MOUTH EVERY DAY   fluconazole (DIFLUCAN) 150 MG tablet Take 1 tablet by mouth today and repeat 1 tablet by mouth in 7 days   hydrOXYzine (VISTARIL) 25 MG capsule Take 1 capsule (25 mg total) by mouth every 8 (eight) hours as needed.   methocarbamol (ROBAXIN) 500 MG tablet Take 1 tablet (500 mg total) by mouth every 8 (eight) hours as needed.   mupirocin cream (BACTROBAN) 2 % Apply 1 Application topically 2 (two) times daily.   nystatin powder Apply twice daily as needed to skin fold   valACYclovir (VALTREX) 500 MG tablet TAKE 1 TABLET (500 MG TOTAL) BY MOUTH DAILY.   famotidine (PEPCID) 20 MG tablet Take 1 tablet (20 mg total) by  mouth 2 (two) times daily.   omeprazole (PRILOSEC) 40 MG capsule Take 1 capsule (40 mg total) by mouth in the morning and at bedtime. (Patient not taking: Reported on 01/13/2024)   sucralfate (CARAFATE) 1 g tablet Take 1 tablet (1 g total) by mouth with breakfast, with lunch, and with evening meal for 7 days. (Patient not taking: Reported on 01/13/2024)   No facility-administered encounter medications on file as of 01/13/2024.     Past Medical History:  Diagnosis Date   Acute lumbar radiculopathy 06/29/2013   Chest pain in adult 05/24/2018   Chronic constipation 05/24/2018   Gastroesophageal reflux disease 05/24/2018   GERD (gastroesophageal reflux disease)    Left sided sciatica 05/10/2013   Lumbar disc herniation 06/29/2013   Morbid obesity (HCC) 05/24/2018   Obesity    Osteopenia 03/23/2015   S/P bariatric surgery 06/30/2013    Past Surgical History:  Procedure Laterality Date   BREAST BIOPSY Left 2014   CHOLECYSTECTOMY  2022   LAPAROSCOPIC GASTRIC SLEEVE RESECTION  2014   LUMBAR DISC SURGERY  2014/2016    Family History  Problem Relation Age of Onset   Arthritis Mother    Early death Mother    Mental illness Mother    Alcohol abuse Father    Arthritis Father    Early death Sister    Thyroid cancer Sister  Hypertension Sister    Heart attack Maternal Grandfather    Uterine cancer Sister    Hypertension Sister    Cervical cancer Sister    Depression Sister    Hypertension Sister    Colon cancer Neg Hx    Esophageal cancer Neg Hx    Breast cancer Neg Hx    Rectal cancer Neg Hx    Stomach cancer Neg Hx     Social History   Social History Narrative   From [place], Divorced since 2015   Works as a Research officer, political party for [employer]   2 daughters- one in the [employer]- lives in [place], one in [place]- works as a Runner, broadcasting/film/video   Enjoys spending time with her husband   Has a dog and will adopt another soon.      Review of Systems General: Denies fevers, chills, weight  loss CV: Denies chest pain, shortness of breath, palpitations Abdomen: Excess skin and fat on the anterior abdominal wall which interferes with daily activities.  She also has significant ongoing rashes which she has not found a satisfactory treatment for  Physical Exam    01/13/2024    2:37 PM 02/25/2023   11:30 PM 02/25/2023   11:15 PM  Vitals with BMI  Height 5' 4.5"    Weight 204 lbs 13 oz    BMI 34.62    Systolic 94 123 116  Diastolic 66 81 90  Pulse 112  93    General:  No acute distress,  Alert and oriented, Non-Toxic, Normal speech and affect Abdomen: Patient has a large pannus which extends to her mid thigh.  Pannus is quite heavy.  She has scarring on the posterior aspect of the pannus consistent with frequent infections. Mammogram: No mammograms reported since 2022.  The mammogram in 2022 was a BI-RADS 1.  Will offer to obtain a mammogram for her if she likes. Assessment/Plan Panniculitis: Patient has a large pannus and would almost certainly benefit from a panniculectomy.  The pannus does extend to her mid thighs and the patient reports that it does interfere with daily activities.  She also complains of rashes on the posterior aspect of the pannus and in the intertriginous regions.  We discussed panniculectomy at length.  First and foremost I told her that I do not believe that it will significantly improve her lower back pain.  I think that she will derive significant benefit from resection of the pannus in regards to her rashes and with her daily activities.  I showed her the location of the incision and we discussed the unpredictable nature of scarring and wound healing.  We discussed the risks of bleeding, infection, and seroma formation.  She understands that she will have drains postoperatively and these may be in place for up to 4 weeks and that she will need to wear compressive garment for 6 weeks.  We discussed the postoperative limitations including no heavy lifting greater  than 20 pounds, no vigorous activity, and no submerging the incisions in water for 6 weeks.  I have told her that I will ask that she begin ambulating immediately after surgery to help decrease the risk of DVT.  She will be able to return to light activity including administrative and computer type work as soon as she feels comfortable.  All questions were answered to her satisfaction.  Photographs were obtained today with her consent.  Will submit her for a panniculectomy at her request.   Santiago Glad 01/13/2024,  3:04 PM

## 2024-01-26 ENCOUNTER — Encounter: Payer: Self-pay | Admitting: *Deleted

## 2024-01-26 ENCOUNTER — Ambulatory Visit: Admitting: Family

## 2024-03-02 ENCOUNTER — Ambulatory Visit: Admitting: Physician Assistant

## 2024-03-02 VITALS — BP 89/55 | HR 97 | Ht 64.0 in | Wt 208.2 lb

## 2024-03-02 DIAGNOSIS — M793 Panniculitis, unspecified: Secondary | ICD-10-CM

## 2024-03-02 DIAGNOSIS — Z8249 Family history of ischemic heart disease and other diseases of the circulatory system: Secondary | ICD-10-CM

## 2024-03-02 NOTE — Progress Notes (Signed)
 Patient ID: Melissa Santiago, female    DOB: [birth year], 58 y.o.   MRN: [id number]  Chief Complaint  Patient presents with   Pre-op Exam      ICD-10-CM   1. Panniculitis  M79.3        History of Present Illness: Melissa Santiago is a 58 y.o.  female  with a history of panniculitis.  She presents for preoperative evaluation for upcoming procedure, panniculectomy, scheduled for 03/25/2024 with Dr.  Carolynne Citron .  The patient has not had problems with anesthesia.  Previous back surgeries and Roux-en-Y surgery without complication.  She denies any personal history of cancer, cardiac or pulmonary disease, inflammatory bowel disease, or varicosities.  Former smoker, but quit nearly 20 years ago.  NKDA.  She would not need to hold any medications prior to surgery.  BP is soft here in clinic and heart rate is borderline tachycardic.  She states that this is her baseline and it appeared similar at her consult 12/2023.  Patient tells me that her mother had innumerable DVTs, but admittedly was in poor health.  However, both of her daughters have each had DVTs and pulmonary emboli.  Her daughter was just recently diagnosed with protein S deficiency.  Discussed with patient that hematologic workup for her is needed given her extensive family history of blood clots.  This is an elective surgery that already has risks for complications and the need to anticoagulate postoperatively intensifies the risks.  Discussed case with Dr. Carolynne Citron who agrees that patient will need hematologic workup and recommendations from hematology.  Summary of Previous Visit: Patient was seen for consult Dr. Carolynne Citron on 01/13/2024.  At that time, complained of large overhanging pannus in the context of significant weight loss following bariatric surgery.  Expressed interest in panniculectomy.  Discussed risks and benefits and patient was agreeable to proceed.  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  PMH Significant for: Recurrent panniculitis, history of bariatric surgery with subsequent extreme weight loss, obesity, GERD.   Past Medical History: Allergies: Allergies  Allergen Reactions   Tolmetin     Patient reports taking OTC ibuprofen and is not allergic     Current Medications:  Current Outpatient Medications:    DULoxetine  (CYMBALTA ) 60 MG capsule, TAKE 1 CAPSULE BY MOUTH EVERY DAY, Disp: 90 capsule, Rfl: 1   valACYclovir  (VALTREX ) 500 MG tablet, TAKE 1 TABLET (500 MG TOTAL) BY MOUTH DAILY., Disp: 90 tablet, Rfl: 1   betamethasone  dipropionate 0.05 % cream, Apply topically 2 (two) times daily. (Patient not taking: Reported on 03/02/2024), Disp: 30 g, Rfl: 0   buPROPion  (WELLBUTRIN  XL) 150 MG 24 hr tablet, TAKE 1 TABLET BY MOUTH EVERY DAY (Patient not taking: Reported on 03/02/2024), Disp: 90 tablet, Rfl: 1   famotidine  (PEPCID ) 20 MG tablet, Take 1 tablet (20 mg total) by mouth 2 (two) times daily., Disp: 60 tablet, Rfl: 2   fluconazole  (DIFLUCAN ) 150 MG tablet, Take 1 tablet by mouth today and repeat 1 tablet by mouth in 7 days (Patient not taking: Reported on 03/02/2024), Disp: 2 tablet, Rfl: 0   hydrOXYzine  (VISTARIL ) 25 MG capsule, Take 1 capsule (25 mg total) by mouth every 8 (eight) hours as needed. (Patient not taking: Reported on 03/02/2024), Disp: 30 capsule, Rfl: 0   methocarbamol  (ROBAXIN ) 500 MG tablet, Take 1 tablet (500 mg total) by mouth every 8 (eight) hours as needed. (Patient not taking: Reported on  03/02/2024), Disp: 20 tablet, Rfl: 0   mupirocin  cream (BACTROBAN ) 2 %, Apply 1 Application topically 2 (two) times daily. (Patient not taking: Reported on 03/02/2024), Disp: 15 g, Rfl: 0   nystatin  powder, Apply twice daily as needed to skin fold (Patient not taking: Reported on 03/02/2024), Disp: 60 g, Rfl: 1   omeprazole  (PRILOSEC) 40 MG capsule, Take 1 capsule (40 mg total) by mouth in the morning and at bedtime. (Patient not taking: Reported on  03/02/2024), Disp: 180 capsule, Rfl: 1   sucralfate  (CARAFATE ) 1 g tablet, Take 1 tablet (1 g total) by mouth with breakfast, with lunch, and with evening meal for 7 days. (Patient not taking: Reported on 03/02/2024), Disp: 21 tablet, Rfl: 0  Past Medical Problems: Past Medical History:  Diagnosis Date   Acute lumbar radiculopathy 06/29/2013   Chest pain in adult 05/24/2018   Chronic constipation 05/24/2018   Gastroesophageal reflux disease 05/24/2018   GERD (gastroesophageal reflux disease)    Left sided sciatica 05/10/2013   Lumbar disc herniation 06/29/2013   Morbid obesity (HCC) 05/24/2018   Obesity    Osteopenia 03/23/2015   S/P bariatric surgery 06/30/2013    Past Surgical History: Past Surgical History:  Procedure Laterality Date   BREAST BIOPSY Left 2014   CHOLECYSTECTOMY  2022   LAPAROSCOPIC GASTRIC SLEEVE RESECTION  2014   LUMBAR DISC SURGERY  2014/2016    Social History: Social History   Socioeconomic History   Marital status: Married    Spouse name: Ernestina Headland   Number of children: 2   Years of education: Not on file   Highest education level: Associate degree: academic program  Occupational History   Occupation: Nature conservation officer  Tobacco Use   Smoking status: Former    Current packs/day: 0.00    Average packs/day: 0.5 packs/day for 15.0 years (7.5 ttl pk-yrs)    Types: Cigarettes    Start date: 16    Quit date: 2006    Years since quitting: 19.3   Smokeless tobacco: Never  Vaping Use   Vaping status: Never Used  Substance and Sexual Activity   Alcohol use: Yes    Comment: rarely   Drug use: Never   Sexual activity: Yes    Partners: Male    Birth control/protection: I.U.D.  Other Topics Concern   Not on file  Social History Narrative   From [place] , Divorced since 2015   Works as a Research officer, political party for [employer]   Remarried   2 daughters- one in [employer]- lives in [place] , one in [place] - works as a Runner, broadcasting/film/video   Enjoys spending time with her husband   Has a dog  and will adopt another soon.    Social Drivers of Corporate investment banker Strain: Low Risk  (09/26/2023)   Overall Financial Resource Strain (CARDIA)    Difficulty of Paying Living Expenses: Not hard at all  Food Insecurity: No Food Insecurity (09/26/2023)   Hunger Vital Sign    Worried About Running Out of Food in the Last Year: Never true    Ran Out of Food in the Last Year: Never true  Transportation Needs: No Transportation Needs (09/26/2023)   PRAPARE - Administrator, Civil Service (Medical): No    Lack of Transportation (Non-Medical): No  Physical Activity: Unknown (09/26/2023)   Exercise Vital Sign    Days of Exercise per Week: 0 days    Minutes of Exercise per Session: Not on file  Stress: Stress Concern Present (  09/26/2023)   [employer] of Occupational Health - Occupational Stress Questionnaire    Feeling of Stress : Rather much  Social Connections: Socially Integrated (09/26/2023)   Social Connection and Isolation Panel [NHANES]    Frequency of Communication with Friends and Family: More than three times a week    Frequency of Social Gatherings with Friends and Family: Once a week    Attends Religious Services: More than 4 times per year    Active Member of [employer] or Organizations: Yes    Attends Engineer, structural: More than 4 times per year    Marital Status: Married  Catering manager Violence: Unknown (04/04/2023)   Received from [facility]   HITS    Physically Hurt: Not on file    Insult or Talk Down To: Not on file    Threaten Physical Harm: Not on file    Scream or Curse: Not on file    Family History: Family History  Problem Relation Age of Onset   Arthritis Mother    Early death Mother    Mental illness Mother    Alcohol abuse Father    Arthritis Father    Early death Sister    Thyroid  cancer Sister    Hypertension Sister    Heart attack Maternal Grandfather    Uterine cancer Sister    Hypertension Sister     Cervical cancer Sister    Depression Sister    Hypertension Sister    Colon cancer Neg Hx    Esophageal cancer Neg Hx    Breast cancer Neg Hx    Rectal cancer Neg Hx    Stomach cancer Neg Hx     Review of Systems: ROS Denies any recent infections or traumas.  Physical Exam: Vital Signs BP (!) 89/55 (BP Location: Left Arm, Patient Position: Sitting, Cuff Size: Normal) Comment: Patient states she feels fine. She states her BPs are always low  Pulse 97   Ht 5\' 4"  (1.626 m)   Wt 208 lb 3.2 oz (94.4 kg)   SpO2 96%   BMI 35.74 kg/m   Physical Exam Constitutional:      General: Not in acute distress.    Appearance: Normal appearance. Not ill-appearing.  HENT:     Head: Normocephalic and atraumatic.  Eyes:     Pupils: Pupils are equal, round. Cardiovascular:     Rate and Rhythm: Normal rate.    Pulses: Normal pulses.  Pulmonary:     Effort: No respiratory distress or increased work of breathing.  Speaks in full sentences. Abdominal:     General: Abdomen is flat. No distension.   Musculoskeletal: Normal range of motion. No lower extremity swelling or edema. No varicosities. Skin:    General: Skin is warm and dry.     Findings: No erythema or rash.  Neurological:     Mental Status: Alert and oriented to person, place, and time.  Psychiatric:        Mood and Affect: Mood normal.        Behavior: Behavior normal.    Assessment/Plan: The patient is scheduled for panniculectomy with Dr.  Carolynne Citron .  Risks, benefits, and alternatives of procedure discussed, questions answered and consent obtained.    Smoking Status: Non-smoker.  Caprini Score: 7; Risk Factors include: Age, BMI greater than 25, family history of thromboses (extensive), and length of planned surgery.  Patient will need evaluation and clearance from hematology prior to moving forward with surgery.  Will notify surgical schedulers.  Dr. Carolynne Citron is already aware and agreeable to plan.  Pictures obtained:  01/13/2024  Post-op Rx sent to pharmacy: Will hold off until hematologic workup  Patient was provided with the General Surgical Risk consent document and Pain Medication Agreement prior to their appointment.  They had adequate time to read through the risk consent documents and Pain Medication Agreement. We also discussed them in person together during this preop appointment. All of their questions were answered to their satisfaction.  Recommended calling if they have any further questions.  Risk consent form and Pain Medication Agreement to be scanned into patient's chart.  The risk that can be encountered for this procedure were discussed and include the following but not limited to these: asymmetry, fluid accumulation, firmness of the tissue, skin loss, decrease or no sensation, fat necrosis, bleeding, infection, healing delay.  Deep vein thrombosis, cardiac and pulmonary complications are risks to any procedure.  There are risks of anesthesia, changes to skin sensation and injury to nerves or blood vessels.  The muscle can be temporarily or permanently injured.  You may have an allergic reaction to tape, suture, glue, blood products which can result in skin discoloration, swelling, pain, skin lesions, poor healing.  Any of these can lead to the need for revisonal surgery or stage procedures.  Weight gain and weigh loss can also effect the long term appearance. The results are not guaranteed to last a lifetime.  Future surgery may be required.      Electronically signed by: Mariel Shope, PA-C 03/02/2024 3:57 PM

## 2024-03-04 ENCOUNTER — Ambulatory Visit (INDEPENDENT_AMBULATORY_CARE_PROVIDER_SITE_OTHER): Admitting: Family

## 2024-03-04 VITALS — BP 106/73 | HR 77 | Temp 97.6°F | Ht 64.0 in | Wt 211.8 lb

## 2024-03-04 DIAGNOSIS — Z01818 Encounter for other preprocedural examination: Secondary | ICD-10-CM | POA: Diagnosis not present

## 2024-03-04 DIAGNOSIS — Z8249 Family history of ischemic heart disease and other diseases of the circulatory system: Secondary | ICD-10-CM | POA: Diagnosis not present

## 2024-03-04 DIAGNOSIS — L304 Erythema intertrigo: Secondary | ICD-10-CM | POA: Diagnosis not present

## 2024-03-04 DIAGNOSIS — L299 Pruritus, unspecified: Secondary | ICD-10-CM | POA: Insufficient documentation

## 2024-03-04 DIAGNOSIS — Z0000 Encounter for general adult medical examination without abnormal findings: Secondary | ICD-10-CM | POA: Insufficient documentation

## 2024-03-04 DIAGNOSIS — F32A Depression, unspecified: Secondary | ICD-10-CM

## 2024-03-04 DIAGNOSIS — F419 Anxiety disorder, unspecified: Secondary | ICD-10-CM | POA: Diagnosis not present

## 2024-03-04 MED ORDER — HYDROXYZINE PAMOATE 25 MG PO CAPS: 25.0000 mg | ORAL_CAPSULE | Freq: Three times a day (TID) | ORAL | 0 refills | Status: DC | PRN

## 2024-03-04 NOTE — Assessment & Plan Note (Signed)
 Both daughters have had clots. One has protein S deficiency. Will check hypercoag panel. If abnormal will likely need post op anticoagulation.

## 2024-03-04 NOTE — Assessment & Plan Note (Signed)
Stable on wellbutrin and cymbalta.  Continue same.

## 2024-03-04 NOTE — Assessment & Plan Note (Signed)
 Only at night- has relief with hydroxyzine . Refill provided.  Check b12 and folate level.

## 2024-03-04 NOTE — Patient Instructions (Signed)
 VISIT SUMMARY:  You came in today for a preoperative evaluation for your upcoming panniculectomy scheduled on May 30th. We discussed your family history of blood clots and potential Protein S deficiency, as well as your ongoing issues with itchy feet at night.  YOUR PLAN:  PREOPERATIVE EVALUATION FOR SURGERY: You are scheduled for a panniculectomy and we need to ensure you are ready for surgery, especially considering your family history of blood clots and potential Protein S deficiency. -We will test for Protein S deficiency and other clotting disorders. -We will also do routine preoperative labs including a complete blood count (CBC), tests for kidney and liver function, and a coagulation profile. -You will have an EKG and a chest x-ray. -Once we have all the test results, we will send your surgical clearance to your surgeon.  PRURITUS OF FEET: You have been experiencing severe itching of your feet at night, which has been managed with hydroxyzine . -Continue taking hydroxyzine  to manage the itching. -We will check your serum B12 levels to see if a deficiency might be contributing to your symptoms.

## 2024-03-04 NOTE — Progress Notes (Signed)
 Subjective:     Patient ID: Melissa Santiago, female    DOB: [birth year], 58 y.o.   MRN: [id number]  Chief Complaint  Patient presents with   Pre-op Exam    Patient presents today for surgical clearance     HPI  Discussed the use of AI scribe software for clinical note transcription with the patient, who gave verbal consent to proceed.  History of Present Illness    History of Present Illness  Melissa Santiago "Melissa Santiago" is a 58 year old female who presents for preoperative evaluation for a planned paniculectomy on 7/30.  She is scheduled for a panniculectomy on May 30th. She has suffered from chronic intertrigo due to her abdomina pannus. There is a significant family history of blood clots and protein S deficiency. Both daughters have experienced pulmonary embolisms; the oldest daughter tested positive for protein S deficiency.  She experiences severe pruritus of her feet, particularly at night, leading to excoriations. Hydroxyzine  effectively manages these symptoms. There is no associated rash or xerosis.  She has persistent back pain. Her hearing and vision are satisfactory with glasses.   Health Maintenance Due  Topic Date Due   HIV Screening  Never done   Hepatitis C Screening  Never done   Zoster Vaccines- Shingrix (1 of 2) Never done   DTaP/Tdap/Td (2 - Td or Tdap) 06/23/2022   MAMMOGRAM  12/04/2022   COVID-19 Vaccine (5 - 2024-25 season) 06/28/2023   Cervical Cancer Screening (HPV/Pap Cotest)  12/15/2023    Past Medical History:  Diagnosis Date   Acute lumbar radiculopathy 06/29/2013   Chest pain in adult 05/24/2018   Chronic constipation 05/24/2018   Gastroesophageal reflux disease 05/24/2018   GERD (gastroesophageal reflux disease)    Left sided sciatica 05/10/2013   Lumbar disc herniation 06/29/2013   Morbid obesity (HCC) 05/24/2018   Obesity    Osteopenia 03/23/2015   S/P bariatric surgery 06/30/2013    Past Surgical History:  Procedure Laterality Date    BREAST BIOPSY Left 2014   CHOLECYSTECTOMY  2022   LAPAROSCOPIC GASTRIC SLEEVE RESECTION  2014   LUMBAR DISC SURGERY  2014/2016    Family History  Problem Relation Age of Onset   Arthritis Mother    Early death Mother    Mental illness Mother    Alcohol abuse Father    Arthritis Father    Early death Sister    Thyroid  cancer Sister    Hypertension Sister    Heart attack Maternal Grandfather    Uterine cancer Sister    Hypertension Sister    Cervical cancer Sister    Depression Sister    Hypertension Sister    Colon cancer Neg Hx    Esophageal cancer Neg Hx    Breast cancer Neg Hx    Rectal cancer Neg Hx    Stomach cancer Neg Hx     Social History   Socioeconomic History   Marital status: Married    Spouse name: Ernestina Headland   Number of children: 2   Years of education: Not on file   Highest education level: Bachelor's degree (e.g., BA, AB, BS)  Occupational History   Occupation: Nature conservation officer  Tobacco Use   Smoking status: Former    Current packs/day: 0.00    Average packs/day: 0.5 packs/day for 15.0 years (7.5 ttl pk-yrs)    Types: Cigarettes    Start date: 54    Quit date: 2006    Years since quitting: 19.3   Smokeless  tobacco: Never  Vaping Use   Vaping status: Never Used  Substance and Sexual Activity   Alcohol use: Yes    Comment: rarely   Drug use: Never   Sexual activity: Yes    Partners: Male    Birth control/protection: I.U.D.  Other Topics Concern   Not on file  Social History Narrative   From [place] , Divorced since 2015   Works as a Research officer, political party for [employer]   Remarried   2 daughters- one in [employer]- lives in [place] , [place] - works as a Runner, broadcasting/film/video   Enjoys spending time with her husband   Has a dog and will adopt another soon.    Social Drivers of Corporate investment banker Strain: Low Risk  (03/04/2024)   Overall Financial Resource Strain (CARDIA)    Difficulty of Paying Living Expenses: Not hard at all  Food Insecurity: No  Food Insecurity (03/04/2024)   Hunger Vital Sign    Worried About Running Out of Food in the Last Year: Never true    Ran Out of Food in the Last Year: Never true  Transportation Needs: No Transportation Needs (03/04/2024)   PRAPARE - Administrator, Civil Service (Medical): No    Lack of Transportation (Non-Medical): No  Physical Activity: Inactive (03/04/2024)   Exercise Vital Sign    Days of Exercise per Week: 1 day    Minutes of Exercise per Session: 0 min  Stress: Stress Concern Present (03/04/2024)   [employer] of Occupational Health - Occupational Stress Questionnaire    Feeling of Stress : Rather much  Social Connections: Socially Integrated (03/04/2024)   Social Connection and Isolation Panel [NHANES]    Frequency of Communication with Friends and Family: More than three times a week    Frequency of Social Gatherings with Friends and Family: Once a week    Attends Religious Services: More than 4 times per year    Active Member of [employer] or Organizations: Yes    Attends Banker Meetings: More than 4 times per year    Marital Status: Married  Catering manager Violence: Unknown (04/04/2023)   Received from [facility]   HITS    Physically Hurt: Not on file    Insult or Talk Down To: Not on file    Threaten Physical Harm: Not on file    Scream or Curse: Not on file    Outpatient Medications Prior to Visit  Medication Sig Dispense Refill   buPROPion  (WELLBUTRIN  XL) 150 MG 24 hr tablet TAKE 1 TABLET BY MOUTH EVERY DAY 90 tablet 1   DULoxetine  (CYMBALTA ) 60 MG capsule TAKE 1 CAPSULE BY MOUTH EVERY DAY 90 capsule 1   nystatin  powder Apply twice daily as needed to skin fold 60 g 1   valACYclovir  (VALTREX ) 500 MG tablet TAKE 1 TABLET (500 MG TOTAL) BY MOUTH DAILY. 90 tablet 1   hydrOXYzine  (VISTARIL ) 25 MG capsule Take 1 capsule (25 mg total) by mouth every 8 (eight) hours as needed. 30 capsule 0   betamethasone  dipropionate 0.05 % cream Apply topically 2  (two) times daily. (Patient not taking: Reported on 03/02/2024) 30 g 0   famotidine  (PEPCID ) 20 MG tablet Take 1 tablet (20 mg total) by mouth 2 (two) times daily. 60 tablet 2   fluconazole  (DIFLUCAN ) 150 MG tablet Take 1 tablet by mouth today and repeat 1 tablet by mouth in 7 days (Patient not taking: Reported on 03/02/2024) 2 tablet 0  methocarbamol  (ROBAXIN ) 500 MG tablet Take 1 tablet (500 mg total) by mouth every 8 (eight) hours as needed. (Patient not taking: Reported on 03/02/2024) 20 tablet 0   mupirocin  cream (BACTROBAN ) 2 % Apply 1 Application topically 2 (two) times daily. (Patient not taking: Reported on 03/02/2024) 15 g 0   omeprazole  (PRILOSEC) 40 MG capsule Take 1 capsule (40 mg total) by mouth in the morning and at bedtime. (Patient not taking: Reported on 03/02/2024) 180 capsule 1   sucralfate  (CARAFATE ) 1 g tablet Take 1 tablet (1 g total) by mouth with breakfast, with lunch, and with evening meal for 7 days. (Patient not taking: Reported on 03/02/2024) 21 tablet 0   No facility-administered medications prior to visit.    Allergies  Allergen Reactions   Tolmetin     Patient reports taking OTC ibuprofen and is not allergic     Review of Systems  HENT:  Negative for congestion and hearing loss.   Eyes:  Negative for blurred vision.  Respiratory:  Negative for cough and sputum production.   Cardiovascular:  Negative for chest pain.  Gastrointestinal:  Negative for constipation and diarrhea.  Genitourinary:  Negative for dysuria and frequency.  Musculoskeletal:  Positive for back pain. Negative for myalgias.  Skin:  Negative for rash.  Neurological:  Negative for headaches.  Psychiatric/Behavioral:  Negative for depression. The patient is not nervous/anxious.        Denies depression/anxiety       Objective:     Physical Exam Constitutional:      General: She is not in acute distress.    Appearance: Normal appearance. She is well-developed.  HENT:     Head: Normocephalic  and atraumatic.     Right Ear: Tympanic membrane, ear canal and external ear normal.     Left Ear: Ear canal and external ear normal. There is impacted cerumen.     Mouth/Throat:     Mouth: Mucous membranes are moist.     Pharynx: No posterior oropharyngeal erythema.  Eyes:     General: No scleral icterus. Neck:     Thyroid : No thyromegaly.  Cardiovascular:     Rate and Rhythm: Normal rate and regular rhythm.     Heart sounds: Normal heart sounds. No murmur heard. Pulmonary:     Effort: Pulmonary effort is normal. No respiratory distress.     Breath sounds: Normal breath sounds. No wheezing.  Musculoskeletal:     Cervical back: Neck supple.  Skin:    General: Skin is warm and dry.  Neurological:     Mental Status: She is alert and oriented to person, place, and time.  Psychiatric:        Mood and Affect: Mood normal.        Behavior: Behavior normal.        Thought Content: Thought content normal.        Judgment: Judgment normal.      BP 106/73   Pulse 77   Temp 97.6 F (36.4 C)   Ht 5\' 4"  (1.626 m)   Wt 211 lb 12.8 oz (96.1 kg)   SpO2 99%   BMI 36.36 kg/m  Wt Readings from Last 3 Encounters:  03/04/24 211 lb 12.8 oz (96.1 kg)  03/02/24 208 lb 3.2 oz (94.4 kg)  01/13/24 204 lb 12.8 oz (92.9 kg)       Assessment & Plan:   Problem List Items Addressed This Visit       Unprioritized   Pruritus  Only at night- has relief with hydroxyzine . Refill provided.  Check b12 and folate level.       Relevant Medications   hydrOXYzine  (VISTARIL ) 25 MG capsule   Other Relevant Orders   B12 and Folate Panel   Preoperative evaluation to rule out surgical contraindication   EKG tracing is personally reviewed.  EKG notes NSR.  No acute changes.   Obtain labs and CXR as part of pre-op evaluation.       Relevant Orders   CBC w/Diff   Comp Met (CMET)   Protime-INR   PTT   DG Chest 2 View   EKG 12-Lead   Family history of blood clots - Primary   Both daughters  have had clots. One has protein S deficiency. Will check hypercoag panel. If abnormal will likely need post op anticoagulation.       Relevant Orders   Antithrombin III   Protein C activity   Protein C, total   Protein S activity   Protein S, total   Lupus anticoagulant panel   Beta-2-glycoprotein i abs, IgG/M/A   Homocysteine, serum   Factor 5 leiden   Prothrombin gene mutation   Cardiolipin antibodies, IgG, IgM, IgA   Anxiety and depression   Stable on wellbutrin  and cymbalta . Continue same.       Relevant Medications   hydrOXYzine  (VISTARIL ) 25 MG capsule    I have discontinued Erleen E. Nault "Sherina Lombard"'s omeprazole , sucralfate , betamethasone  dipropionate, mupirocin  cream, famotidine , methocarbamol , and fluconazole . I am also having her maintain her DULoxetine , nystatin , buPROPion , valACYclovir , and hydrOXYzine .  Meds ordered this encounter  Medications   hydrOXYzine  (VISTARIL ) 25 MG capsule    Sig: Take 1 capsule (25 mg total) by mouth every 8 (eight) hours as needed.    Dispense:  30 capsule    Refill:  0    Supervising Provider:   Randie Bustle A [[id number]]

## 2024-03-04 NOTE — Assessment & Plan Note (Signed)
 EKG tracing is personally reviewed.  EKG notes NSR.  No acute changes.   Obtain labs and CXR as part of pre-op evaluation.

## 2024-03-05 LAB — LUPUS ANTICOAGULANT PANEL

## 2024-03-06 LAB — BETA-2-GLYCOPROTEIN I ABS, IGG/M/A

## 2024-03-06 LAB — LUPUS ANTICOAGULANT PANEL

## 2024-03-07 ENCOUNTER — Telehealth: Payer: Self-pay | Admitting: Family

## 2024-03-07 DIAGNOSIS — E538 Deficiency of other specified B group vitamins: Secondary | ICD-10-CM | POA: Insufficient documentation

## 2024-03-07 DIAGNOSIS — Z01818 Encounter for other preprocedural examination: Secondary | ICD-10-CM | POA: Diagnosis not present

## 2024-03-07 DIAGNOSIS — D6859 Other primary thrombophilia: Secondary | ICD-10-CM | POA: Insufficient documentation

## 2024-03-07 DIAGNOSIS — Z8249 Family history of ischemic heart disease and other diseases of the circulatory system: Secondary | ICD-10-CM | POA: Diagnosis not present

## 2024-03-07 MED ORDER — FOLIC ACID 1 MG PO TABS: 1.0000 mg | ORAL_TABLET | Freq: Every day | ORAL | Status: DC

## 2024-03-07 NOTE — Telephone Encounter (Addendum)
 Please advise patient that her testing panel is still resulting, but the protein S shows deficiency.  I would recommend that she meet with Dr. Maria Shiner hematology prior to surgery to get his input on blood thinners post op to prevent clot.  He can also give us  information on if he feels she needs to be on aspirin therapy long term.  I will see how quickly we can get her in to be seen, but I can't guarantee it will be before the 5/30.  It would be wise to postpone surgery slightly if necessary to get her seen first though.  We will also let Dr. Cleora Daft know.   Also, please let pt know that her b12 and folate levels are low. This is likely cause for her feet itching.  I would recommend that she begin b12 injections 1000 mcg IM weekly x 4 then continue monthly.    Also add folic acid 1mg  by mouth otc daily. Repeat b12 and folate levels in 3 months.   Dr. Cleora Daft- FYI.  Thanks.

## 2024-03-07 NOTE — Addendum Note (Signed)
 Addended by: Susa Engman A on: 03/07/2024 08:02 AM   Modules accepted: Orders

## 2024-03-07 NOTE — Telephone Encounter (Signed)
 Patient notified of results, provider's comments and recommendations.  She will call with pharmacy information in [place]  (she is there for 2 weeks) she will want B12 and supplies be sent to her for at home administration.

## 2024-03-08 NOTE — Telephone Encounter (Signed)
 Opened in error

## 2024-03-09 LAB — LUPUS ANTICOAGULANT PANEL
Dilute Viper Venom Time: 40.7 s (ref 0.0–47.0)
PTT Lupus Anticoagulant: 37.2 s (ref 0.0–43.5)

## 2024-03-10 ENCOUNTER — Encounter: Payer: Self-pay | Admitting: Family

## 2024-03-13 LAB — COMPREHENSIVE METABOLIC PANEL WITH GFR
AG Ratio: 2 (calc) (ref 1.0–2.5)
ALT: 17 U/L (ref 6–29)
AST: 18 U/L (ref 10–35)
Albumin: 4.1 g/dL (ref 3.6–5.1)
Alkaline phosphatase (APISO): 95 U/L (ref 37–153)
BUN: 12 mg/dL (ref 7–25)
CO2: 27 mmol/L (ref 20–32)
Calcium: 9 mg/dL (ref 8.6–10.4)
Chloride: 103 mmol/L (ref 98–110)
Creat: 0.65 mg/dL (ref 0.50–1.03)
Globulin: 2.1 g/dL (ref 1.9–3.7)
Glucose, Bld: 82 mg/dL (ref 65–99)
Potassium: 4.2 mmol/L (ref 3.5–5.3)
Sodium: 140 mmol/L (ref 135–146)
Total Bilirubin: 0.3 mg/dL (ref 0.2–1.2)
Total Protein: 6.2 g/dL (ref 6.1–8.1)
eGFR: 103 mL/min/{1.73_m2} (ref >=60)

## 2024-03-13 LAB — CBC WITH DIFFERENTIAL/PLATELET
Absolute Lymphocytes: 2138 {cells}/uL (ref 850–3900)
Absolute Monocytes: 307 {cells}/uL (ref 200–950)
Basophils Absolute: 58 {cells}/uL (ref 0–200)
Basophils Relative: 0.9 %
Eosinophils Absolute: 70 {cells}/uL (ref 15–500)
Eosinophils Relative: 1.1 %
HCT: 38.9 % (ref 35.0–45.0)
Hemoglobin: 13.3 g/dL (ref 11.7–15.5)
MCH: 28.6 pg (ref 27.0–33.0)
MCHC: 34.2 g/dL (ref 32.0–36.0)
MCV: 83.7 fL (ref 80.0–100.0)
MPV: 11.1 fL (ref 7.5–12.5)
Monocytes Relative: 4.8 %
Neutro Abs: 3827 {cells}/uL (ref 1500–7800)
Neutrophils Relative %: 59.8 %
Platelets: 220 10*3/uL (ref 140–400)
RBC: 4.65 10*6/uL (ref 3.80–5.10)
RDW: 13 % (ref 11.0–15.0)
Total Lymphocyte: 33.4 %
WBC: 6.4 10*3/uL (ref 3.8–10.8)

## 2024-03-13 LAB — PROTIME-INR
INR: 0.9
Prothrombin Time: 9.5 s (ref 9.0–11.5)

## 2024-03-13 LAB — PROTEIN C ACTIVITY: Protein C Activity: 156 %{normal} (ref 70–180)

## 2024-03-13 LAB — B12 AND FOLATE PANEL
Folate: 5 ng/mL — ABNORMAL LOW
Vitamin B-12: 260 pg/mL (ref 200–1100)

## 2024-03-13 LAB — FACTOR 5 LEIDEN: Result: NEGATIVE

## 2024-03-13 LAB — CARDIOLIPIN ANTIBODIES, IGG, IGM, IGA
Anticardiolipin IgA: <2 [APL'U]/mL (ref <20.0)
Anticardiolipin IgG: <2 [GPL'U]/mL (ref <20.0)
Anticardiolipin IgM: <2 [MPL'U]/mL (ref <20.0)

## 2024-03-13 LAB — HOMOCYSTEINE: Homocysteine: 20 umol/L — ABNORMAL HIGH (ref <10.4)

## 2024-03-13 LAB — PROTHROMBIN GENE MUTATION: PROTHROMBIN (FACTOR II): NEGATIVE

## 2024-03-13 LAB — PROTEIN S ACTIVITY: Protein S Activity: 35 %{normal} — ABNORMAL LOW (ref 60–140)

## 2024-03-13 LAB — PROTEIN C, TOTAL: Protein C Antigen: 110 %{normal} (ref 70–140)

## 2024-03-13 LAB — ANTITHROMBIN III: AntiThromb III Func: 140 %{normal} — ABNORMAL HIGH (ref 80–135)

## 2024-03-13 LAB — APTT: aPTT: 24 s (ref 23–32)

## 2024-03-13 LAB — PROTEIN S, TOTAL: PROTEIN S ANTIGEN, TOTAL: 57 %{normal} — ABNORMAL LOW (ref 70–140)

## 2024-03-14 ENCOUNTER — Encounter: Payer: Self-pay | Admitting: Hematology & Oncology

## 2024-03-14 ENCOUNTER — Inpatient Hospital Stay

## 2024-03-14 ENCOUNTER — Encounter: Admitting: Family

## 2024-03-18 ENCOUNTER — Other Ambulatory Visit: Payer: Self-pay | Admitting: Family

## 2024-03-18 DIAGNOSIS — E7211 Homocystinuria: Secondary | ICD-10-CM

## 2024-03-18 DIAGNOSIS — Z8249 Family history of ischemic heart disease and other diseases of the circulatory system: Secondary | ICD-10-CM

## 2024-03-18 DIAGNOSIS — D6859 Other primary thrombophilia: Secondary | ICD-10-CM

## 2024-03-22 ENCOUNTER — Inpatient Hospital Stay: Attending: Hematology & Oncology

## 2024-03-22 ENCOUNTER — Encounter: Payer: Self-pay | Admitting: Family

## 2024-03-22 ENCOUNTER — Telehealth: Payer: Self-pay | Admitting: Family

## 2024-03-22 ENCOUNTER — Inpatient Hospital Stay (HOSPITAL_BASED_OUTPATIENT_CLINIC_OR_DEPARTMENT_OTHER): Admitting: Family

## 2024-03-22 VITALS — BP 110/67 | HR 90 | Temp 98.6°F | Resp 19 | Ht 64.5 in | Wt 213.1 lb

## 2024-03-22 DIAGNOSIS — Z87891 Personal history of nicotine dependence: Secondary | ICD-10-CM

## 2024-03-22 DIAGNOSIS — Z808 Family history of malignant neoplasm of other organs or systems: Secondary | ICD-10-CM

## 2024-03-22 DIAGNOSIS — Z79899 Other long term (current) drug therapy: Secondary | ICD-10-CM | POA: Insufficient documentation

## 2024-03-22 DIAGNOSIS — Z7901 Long term (current) use of anticoagulants: Secondary | ICD-10-CM

## 2024-03-22 DIAGNOSIS — Z832 Family history of diseases of the blood and blood-forming organs and certain disorders involving the immune mechanism: Secondary | ICD-10-CM | POA: Insufficient documentation

## 2024-03-22 DIAGNOSIS — E7211 Homocystinuria: Secondary | ICD-10-CM | POA: Diagnosis not present

## 2024-03-22 DIAGNOSIS — D6859 Other primary thrombophilia: Secondary | ICD-10-CM | POA: Insufficient documentation

## 2024-03-22 DIAGNOSIS — Z8249 Family history of ischemic heart disease and other diseases of the circulatory system: Secondary | ICD-10-CM

## 2024-03-22 LAB — CBC WITH DIFFERENTIAL (CANCER CENTER ONLY)
Abs Immature Granulocytes: 0.02 10*3/uL (ref 0.00–0.07)
Basophils Absolute: 0.1 10*3/uL (ref 0.0–0.1)
Basophils Relative: 1 %
Eosinophils Absolute: 0.1 10*3/uL (ref 0.0–0.5)
Eosinophils Relative: 1 %
HCT: 39.4 % (ref 36.0–46.0)
Hemoglobin: 12.7 g/dL (ref 12.0–15.0)
Immature Granulocytes: 0 %
Lymphocytes Relative: 32 %
Lymphs Abs: 2.2 10*3/uL (ref 0.7–4.0)
MCH: 27.4 pg (ref 26.0–34.0)
MCHC: 32.2 g/dL (ref 30.0–36.0)
MCV: 85.1 fL (ref 80.0–100.0)
Monocytes Absolute: 0.4 10*3/uL (ref 0.1–1.0)
Monocytes Relative: 6 %
Neutro Abs: 4 10*3/uL (ref 1.7–7.7)
Neutrophils Relative %: 60 %
Platelet Count: 244 10*3/uL (ref 150–400)
RBC: 4.63 MIL/uL (ref 3.87–5.11)
RDW: 13.5 % (ref 11.5–15.5)
WBC Count: 6.8 10*3/uL (ref 4.0–10.5)
nRBC: 0 % (ref 0.0–0.2)

## 2024-03-22 LAB — CMP (CANCER CENTER ONLY)
ALT: 19 U/L (ref 0–44)
AST: 21 U/L (ref 15–41)
Albumin: 4.4 g/dL (ref 3.5–5.0)
Alkaline Phosphatase: 106 U/L (ref 38–126)
Anion gap: 8 (ref 5–15)
BUN: 11 mg/dL (ref 6–20)
CO2: 28 mmol/L (ref 22–32)
Calcium: 9.4 mg/dL (ref 8.9–10.3)
Chloride: 104 mmol/L (ref 98–111)
Creatinine: 0.75 mg/dL (ref 0.44–1.00)
GFR, Estimated: >60 mL/min (ref >60)
Glucose, Bld: 97 mg/dL (ref 70–99)
Potassium: 4.3 mmol/L (ref 3.5–5.1)
Sodium: 140 mmol/L (ref 135–145)
Total Bilirubin: 0.3 mg/dL (ref 0.0–1.2)
Total Protein: 6.2 g/dL — ABNORMAL LOW (ref 6.5–8.1)

## 2024-03-22 MED ORDER — FOLIC ACID 1 MG PO TABS: 1.0000 mg | ORAL_TABLET | Freq: Every day | ORAL | 11 refills | Status: AC

## 2024-03-22 NOTE — Telephone Encounter (Signed)
 Pt states she is supposed to receive an rx for b-12 shots but has not gotten anything. Please advise.

## 2024-03-22 NOTE — Progress Notes (Unsigned)
 Hematology/Oncology Consultation   Name: Melissa Santiago      MRN: [id number]    Location: Room/bed info not found  Date: 03/22/2024 Time:2:44 PM   REFERRING PHYSICIAN:  Dorrene Gaucher   REASON FOR CONSULT:  Protein S deficiency, Family history of blood clots   DIAGNOSIS:  Protein S deficiency  Significant family history of thromboembolic disease  HISTORY OF PRESENT ILLNESS: Ms. Melissa Santiago is a very pleasant 58 yo caucasian female with recent diagnoses of protein S deficiency as well as hyperhomocystinemia.  Order placed for her to start taking 1 mg folic acid  PO daily.  She has not personal history of thrombotic event but her mother and both daughters have history of PE and DVTs.  Patient does have history of miscarriage.  Her mother was diagnosed with PE and DVT. She was a heavy smoker.   Her youngest daughter developed extensive PE and DVT's at 56 yo after starting oral contraception. She was treated with Coumadin for years and then transitioned to Lovenox which she is still on.  Her second daughter was diagnosed with PE and DVT several years ago after having her gallbladder removed and then flying from [place]  to [place] .  Her eldest daughter received hyper coag testing while going through fertility work up and her youngest daughter followed suit right after. They both have protein S deficiency.  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 She was scheduled for panniculectomy on 5/30 with Dr. Larraine Santiago but this is on pause for now while we put together to prevent development of a thrombus.  She states that she has had 4 major surgeries, 2 of which were on her lower back, without any complications.  She also travels to [place]  and [place]  20 or more times a year.  No history of cancer.  No history of diabetes or thyroid  disease.  No smoking, ETOH or recreational drug use.  Appetite and hydration are good. Weight is stable at 213 lbs.  No fever,  chills, n/v, cough, rash, dizziness, SOB, chest pain, palpitations, abdominal pain or changes in bowel or bladder habits.  No swelling in her extremities.  She has chronic lower back issues that cause numbness and tingling in her feet as well as sciatica in both legs.  No falls or syncope reported.  She stays busy visiting her girls in [place]  and going [place]  for work.  She works in Chiropractor for travel positions.   ROS: All other 10 point review of systems is negative.   PAST MEDICAL HISTORY:   Past Medical History:  Diagnosis Date   Acute lumbar radiculopathy 06/29/2013   Chest pain in adult 05/24/2018   Chronic constipation 05/24/2018   Gastroesophageal reflux disease 05/24/2018   GERD (gastroesophageal reflux disease)    Left sided sciatica 05/10/2013   Lumbar disc herniation 06/29/2013   Morbid obesity (HCC) 05/24/2018   Obesity    Osteopenia 03/23/2015   S/P bariatric surgery 06/30/2013    ALLERGIES: Allergies  Allergen Reactions   Tolmetin     Patient reports taking OTC ibuprofen and is not allergic       MEDICATIONS:  Current Outpatient Medications on File Prior to Visit  Medication Sig Dispense Refill   buPROPion  (WELLBUTRIN  XL) 150 MG 24 hr tablet TAKE 1 TABLET BY MOUTH EVERY DAY 90 tablet 1   DULoxetine  (CYMBALTA ) 60 MG capsule TAKE 1 CAPSULE BY MOUTH EVERY DAY 90 capsule 1   folic acid  (FOLVITE ) 1 MG tablet Take  1 tablet (1 mg total) by mouth daily.     hydrOXYzine  (VISTARIL ) 25 MG capsule Take 1 capsule (25 mg total) by mouth every 8 (eight) hours as needed. 30 capsule 0   nystatin  powder Apply twice daily as needed to skin fold 60 g 1   valACYclovir  (VALTREX ) 500 MG tablet TAKE 1 TABLET (500 MG TOTAL) BY MOUTH DAILY. 90 tablet 1   No current facility-administered medications on file prior to visit.     PAST SURGICAL HISTORY Past Surgical History:  Procedure Laterality Date   BREAST BIOPSY Left 2014   CHOLECYSTECTOMY  2022   LAPAROSCOPIC GASTRIC SLEEVE  RESECTION  2014   LUMBAR DISC SURGERY  2014/2016    FAMILY HISTORY: Family History  Problem Relation Age of Onset   Arthritis Mother    Early death Mother    Mental illness Mother    Alcohol abuse Father    Arthritis Father    Early death Sister    Thyroid  cancer Sister    Hypertension Sister    Heart attack Maternal Grandfather    Uterine cancer Sister    Hypertension Sister    Cervical cancer Sister    Depression Sister    Hypertension Sister    Colon cancer Neg Hx    Esophageal cancer Neg Hx    Breast cancer Neg Hx    Rectal cancer Neg Hx    Stomach cancer Neg Hx     SOCIAL HISTORY:  reports that she quit smoking about 19 years ago. Her smoking use included cigarettes. She started smoking about 34 years ago. She has a 7.5 pack-year smoking history. She has never used smokeless tobacco. She reports current alcohol use. She reports that she does not use drugs.  PERFORMANCE STATUS: The patient's performance status is 1 - Symptomatic but completely ambulatory  PHYSICAL EXAM: Most Recent Vital Signs: There were no vitals taken for this visit. There were no vitals taken for this visit.  General Appearance:    Alert, cooperative, no distress, appears stated age  Head:    Normocephalic, without obvious abnormality, atraumatic  Eyes:    PERRL, conjunctiva/corneas clear, EOM's intact, fundi    benign, both eyes        Throat:   Lips, mucosa, and tongue normal; teeth and gums normal  Neck:   Supple, symmetrical, trachea midline, no adenopathy;    thyroid :  no enlargement/tenderness/nodules; no carotid   bruit or JVD  Back:     Symmetric, no curvature, ROM normal, no CVA tenderness  Lungs:     Clear to auscultation bilaterally, respirations unlabored  Chest Wall:    No tenderness or deformity   Heart:    Regular rate and rhythm, S1 and S2 normal, no murmur, rub   or gallop     Abdomen:     Soft, non-tender, bowel sounds active all four quadrants,    no masses, no  organomegaly        Extremities:   Extremities normal, atraumatic, no cyanosis or edema  Pulses:   2+ and symmetric all extremities  Skin:   Skin color, texture, turgor normal, no rashes or lesions  Lymph nodes:   Cervical, supraclavicular, and axillary nodes normal  Neurologic:   CNII-XII intact, normal strength, sensation and reflexes    throughout    LABORATORY DATA:  Results for orders placed or performed in visit on 03/22/24 (from the past 48 hours)  CBC with Differential (Cancer Center Only)     Status:  None   Collection Time: 03/22/24  2:20 PM  Result Value Ref Range   WBC Count 6.8 4.0 - 10.5 K/uL   RBC 4.63 3.87 - 5.11 MIL/uL   Hemoglobin 12.7 12.0 - 15.0 g/dL   HCT 16.1 09.6 - 04.5 %   MCV 85.1 80.0 - 100.0 fL   MCH 27.4 26.0 - 34.0 pg   MCHC 32.2 30.0 - 36.0 g/dL   RDW 40.9 81.1 - 91.4 %   Platelet Count 244 150 - 400 K/uL   nRBC 0.0 0.0 - 0.2 %   Neutrophils Relative % 60 %   Neutro Abs 4.0 1.7 - 7.7 K/uL   Lymphocytes Relative 32 %   Lymphs Abs 2.2 0.7 - 4.0 K/uL   Monocytes Relative 6 %   Monocytes Absolute 0.4 0.1 - 1.0 K/uL   Eosinophils Relative 1 %   Eosinophils Absolute 0.1 0.0 - 0.5 K/uL   Basophils Relative 1 %   Basophils Absolute 0.1 0.0 - 0.1 K/uL   Immature Granulocytes 0 %   Abs Immature Granulocytes 0.02 0.00 - 0.07 K/uL    Comment: Performed at [facility], [address] [place], [place] [address]      RADIOGRAPHY: No results found.     PATHOLOGY: None  ASSESSMENT/PLAN: Ms. Dicker is a very pleasant 58 yo caucasian female with recent diagnoses of protein S deficiency as well as hyperhomocystinemia.  From our standpoint she is ok to proceed with surgery (panniculectomy).  I have reached out to her Surgeon Dr. Milda Aline and updated him as well.  We will have her start Xarelto 10 mg PO daily 7 days prior to surgery. She will stop taking 2 days before surgery and restart the day after surgery for 1 month.  Follow-up in 8 weeks.    All questions were answered. The patient knows to call the clinic with any problems, questions or concerns. We can certainly see the patient much sooner if necessary.  The patient was discussed with Dr. Maria Shiner and he is in agreement with the aforementioned.   Kennard Pea, NP

## 2024-03-23 ENCOUNTER — Other Ambulatory Visit: Payer: Self-pay | Admitting: Family

## 2024-03-23 ENCOUNTER — Encounter: Payer: Self-pay | Admitting: Family

## 2024-03-23 LAB — HOMOCYSTEINE: Homocysteine: 13.3 umol/L (ref 0.0–14.5)

## 2024-03-23 MED ORDER — RIVAROXABAN 10 MG PO TABS: 10.0000 mg | ORAL_TABLET | Freq: Every day | ORAL | 1 refills | Status: AC

## 2024-03-24 ENCOUNTER — Telehealth

## 2024-03-24 ENCOUNTER — Encounter: Payer: Self-pay | Admitting: Family

## 2024-03-24 DIAGNOSIS — J3489 Other specified disorders of nose and nasal sinuses: Secondary | ICD-10-CM | POA: Diagnosis not present

## 2024-03-24 DIAGNOSIS — Z8614 Personal history of Methicillin resistant Staphylococcus aureus infection: Secondary | ICD-10-CM | POA: Diagnosis not present

## 2024-03-24 LAB — PROTEIN S ACTIVITY: Protein S Activity: 32 % — ABNORMAL LOW (ref 63–140)

## 2024-03-24 LAB — PROTEIN S, TOTAL: Protein S Ag, Total: 41 % — ABNORMAL LOW (ref 60–150)

## 2024-03-24 MED ORDER — CYANOCOBALAMIN 1000 MCG/ML IJ SOLN: INTRAMUSCULAR | 0 refills | Status: DC

## 2024-03-24 MED ORDER — BD DISP NEEDLE 25G X 1" MISC: 1 refills | Status: AC

## 2024-03-24 MED ORDER — MUPIROCIN 2 % EX OINT: 1.0000 | TOPICAL_OINTMENT | Freq: Two times a day (BID) | CUTANEOUS | 0 refills | Status: DC

## 2024-03-24 MED ORDER — SYRINGE/NEEDLE (DISP) 25G X 1" 3 ML MISC: 1 refills | Status: DC

## 2024-03-24 NOTE — Addendum Note (Signed)
 Addended by: Obert Espindola D on: 03/24/2024 09:25 AM   Modules accepted: Orders

## 2024-03-24 NOTE — Patient Instructions (Signed)
  Hermine Loots Chalmers, thank you for joining Angelia Kelp, PA-C for today's virtual visit.  While this provider is not your primary care provider (PCP), if your PCP is located in our provider database this encounter information will be shared with them immediately following your visit.   A [facility] MyChart account gives you access to today's visit and all your visits, tests, and labs performed at [facility] " click here if you don't have a [facility] MyChart account or go to [email or web address]  Consent: (Patient) Hermine Loots Boudoin provided verbal consent for this virtual visit at the beginning of the encounter.  Current Medications:  Current Outpatient Medications:    mupirocin  ointment (BACTROBAN ) 2 %, Apply 1 Application topically 2 (two) times daily., Disp: 22 g, Rfl: 0   buPROPion  (WELLBUTRIN  XL) 150 MG 24 hr tablet, TAKE 1 TABLET BY MOUTH EVERY DAY, Disp: 90 tablet, Rfl: 1   DULoxetine  (CYMBALTA ) 60 MG capsule, TAKE 1 CAPSULE BY MOUTH EVERY DAY, Disp: 90 capsule, Rfl: 1   folic acid  (FOLVITE ) 1 MG tablet, Take 1 tablet (1 mg total) by mouth daily., Disp: 30 tablet, Rfl: 11   hydrOXYzine  (VISTARIL ) 25 MG capsule, Take 1 capsule (25 mg total) by mouth every 8 (eight) hours as needed., Disp: 30 capsule, Rfl: 0   melatonin 3 MG TABS tablet, Take 3 mg by mouth at bedtime as needed., Disp: , Rfl:    Menthol, Topical Analgesic, 4 % GEL, Apply 1 Application topically as needed., Disp: , Rfl:    nystatin  powder, Apply twice daily as needed to skin fold, Disp: 60 g, Rfl: 1   rivaroxaban (XARELTO) 10 MG TABS tablet, Take 1 tablet (10 mg total) by mouth daily. Start taking 1 week prior to surgery. Stop 2 days before surgery and restart the day after for 1 month., Disp: 30 tablet, Rfl: 1   valACYclovir  (VALTREX ) 500 MG tablet, TAKE 1 TABLET (500 MG TOTAL) BY MOUTH DAILY. (Patient taking differently: Take 500 mg by mouth daily. As needed), Disp: 90 tablet, Rfl: 1   Medications  ordered in this encounter:  Meds ordered this encounter  Medications   mupirocin  ointment (BACTROBAN ) 2 %    Sig: Apply 1 Application topically 2 (two) times daily.    Dispense:  22 g    Refill:  0    Supervising Provider:   Corine Dice [[id number]]     *If you need refills on other medications prior to your next appointment, please contact your pharmacy*  Follow-Up: Call back or seek an in-person evaluation if the symptoms worsen or if the condition fails to improve as anticipated.  [facility] [phone]    If you have been instructed to have an in-person evaluation today at a local Urgent Care facility, please use the link below. It will take you to a list of all of our available [facility], including address, phone number and hours of operation. Please do not delay care.  [facility]  If you or a family member do not have a primary care provider, use the link below to schedule a visit and establish care. When you choose a [facility] primary care physician or advanced practice provider, you gain a long-term partner in health. Find a Primary Care Provider  Learn more about [facility]'s in-office and virtual care options: [facility] - Get Care Now

## 2024-03-24 NOTE — Telephone Encounter (Signed)
 Rx sent by [facility]

## 2024-03-24 NOTE — Progress Notes (Signed)
 Virtual Visit Consent   Melissa Santiago, you are scheduled for a virtual visit with a [facility] provider today. Just as with appointments in the office, your consent must be obtained to participate. Your consent will be active for this visit and any virtual visit you may have with one of our providers in the next 365 days. If you have a MyChart account, a copy of this consent can be sent to you electronically.  As this is a virtual visit, video technology does not allow for your provider to perform a traditional examination. This may limit your provider's ability to fully assess your condition. If your provider identifies any concerns that need to be evaluated in person or the need to arrange testing (such as labs, EKG, etc.), we will make arrangements to do so. Although advances in technology are sophisticated, we cannot ensure that it will always work on either your end or our end. If the connection with a video visit is poor, the visit may have to be switched to a telephone visit. With either a video or telephone visit, we are not always able to ensure that we have a secure connection.  By engaging in this virtual visit, you consent to the provision of healthcare and authorize for your insurance to be billed (if applicable) for the services provided during this visit. Depending on your insurance coverage, you may receive a charge related to this service.  I need to obtain your verbal consent now. Are you willing to proceed with your visit today? Melissa Santiago has provided verbal consent on 03/24/2024 for a virtual visit (video or telephone). Angelia Kelp, PA-C  Date: 03/24/2024 8:32 AM   Virtual Visit via Video Note   I, Angelia Kelp, connected with  Melissa Santiago  ([id number], Nov 20, 1965) on 03/24/24 at  8:30 AM EDT by a video-enabled telemedicine application and verified that I am speaking with the correct person using two identifiers.  Location: Patient: Virtual Visit  Location Patient: Home Provider: Virtual Visit Location Provider: Home Office   I discussed the limitations of evaluation and management by telemedicine and the availability of in person appointments. The patient expressed understanding and agreed to proceed.    History of Present Illness: Melissa Santiago is a 58 y.o. who identifies as a female who was assigned female at birth, and is being seen today for sore in nose. Has a history of MRSA. Started as a tingling and itching. Then had some mild rhinorrhea. Over the last day noticed it became more painful and swollen over the left nostril. Denies fevers, chills, nausea, vomiting. Denies any other open wounds or sores on body developing.   Problems:  Patient Active Problem List   Diagnosis Date Noted   Protein S deficiency (HCC) 03/07/2024   Folate deficiency 03/07/2024   Family history of blood clots 03/04/2024   Preoperative evaluation to rule out surgical contraindication 03/04/2024   Pruritus 03/04/2024   Intertrigo 12/17/2023   Sciatica of right side 09/29/2023   Oral herpes 01/02/2023   Obesity (BMI 30.0-34.9) 11/03/2022   History of alcohol abuse 10/01/2022   Tinea pedis of left foot 07/02/2022   Elevated LFTs 04/09/2022   Vestibular neuritis 04/07/2022   Lymphadenopathy 01/27/2022   Overactive bladder 06/17/2021   Anxiety and depression 05/03/2021   Liver mass 05/03/2021   Morbid obesity (HCC) 05/24/2018   Gastroesophageal reflux disease 05/24/2018   Chronic constipation 05/24/2018   Osteopenia 03/23/2015   S/P bariatric surgery 06/30/2013  Lumbar disc herniation 06/29/2013   Left sided sciatica 05/10/2013    Allergies:  Allergies  Allergen Reactions   Tolmetin     Patient reports taking OTC ibuprofen and is not allergic    Medications:  Current Outpatient Medications:    mupirocin  ointment (BACTROBAN ) 2 %, Apply 1 Application topically 2 (two) times daily., Disp: 22 g, Rfl: 0   buPROPion  (WELLBUTRIN  XL) 150 MG 24  hr tablet, TAKE 1 TABLET BY MOUTH EVERY DAY, Disp: 90 tablet, Rfl: 1   DULoxetine  (CYMBALTA ) 60 MG capsule, TAKE 1 CAPSULE BY MOUTH EVERY DAY, Disp: 90 capsule, Rfl: 1   folic acid  (FOLVITE ) 1 MG tablet, Take 1 tablet (1 mg total) by mouth daily., Disp: 30 tablet, Rfl: 11   hydrOXYzine  (VISTARIL ) 25 MG capsule, Take 1 capsule (25 mg total) by mouth every 8 (eight) hours as needed., Disp: 30 capsule, Rfl: 0   melatonin 3 MG TABS tablet, Take 3 mg by mouth at bedtime as needed., Disp: , Rfl:    Menthol, Topical Analgesic, 4 % GEL, Apply 1 Application topically as needed., Disp: , Rfl:    nystatin  powder, Apply twice daily as needed to skin fold, Disp: 60 g, Rfl: 1   rivaroxaban (XARELTO) 10 MG TABS tablet, Take 1 tablet (10 mg total) by mouth daily. Start taking 1 week prior to surgery. Stop 2 days before surgery and restart the day after for 1 month., Disp: 30 tablet, Rfl: 1   valACYclovir  (VALTREX ) 500 MG tablet, TAKE 1 TABLET (500 MG TOTAL) BY MOUTH DAILY. (Patient taking differently: Take 500 mg by mouth daily. As needed), Disp: 90 tablet, Rfl: 1  Observations/Objective: Patient is well-developed, well-nourished in no acute distress.  Resting comfortably at home.  Head is normocephalic, atraumatic.  No labored breathing.  Speech is clear and coherent with logical content.  Patient is alert and oriented at baseline.    Assessment and Plan: 1. Nasal sore (Primary) - mupirocin  ointment (BACTROBAN ) 2 %; Apply 1 Application topically 2 (two) times daily.  Dispense: 22 g; Refill: 0  2. History of MRSA infection - mupirocin  ointment (BACTROBAN ) 2 %; Apply 1 Application topically 2 (two) times daily.  Dispense: 22 g; Refill: 0  - Mupirocin  prescribed - Can use cold compresses for pain and swelling - Tylenol  and Ibuprofen if needed for pain and swelling - Seek in person evaluation if worsening or fails to resolve  Follow Up Instructions: I discussed the assessment and treatment plan with the  patient. The patient was provided an opportunity to ask questions and all were answered. The patient agreed with the plan and demonstrated an understanding of the instructions.  A copy of instructions were sent to the patient via MyChart unless otherwise noted below.    The patient was advised to call back or seek an in-person evaluation if the symptoms worsen or if the condition fails to improve as anticipated.    Angelia Kelp, PA-C

## 2024-03-25 ENCOUNTER — Ambulatory Visit (HOSPITAL_BASED_OUTPATIENT_CLINIC_OR_DEPARTMENT_OTHER): Admit: 2024-03-25 | Admitting: Plastic Surgery

## 2024-03-25 ENCOUNTER — Encounter (HOSPITAL_BASED_OUTPATIENT_CLINIC_OR_DEPARTMENT_OTHER): Payer: Self-pay

## 2024-03-25 SURGERY — PANNICULECTOMY
Anesthesia: Choice

## 2024-03-31 ENCOUNTER — Encounter: Admitting: Plastic Surgery

## 2024-03-31 ENCOUNTER — Ambulatory Visit (INDEPENDENT_AMBULATORY_CARE_PROVIDER_SITE_OTHER): Admitting: Surgical

## 2024-03-31 DIAGNOSIS — D6859 Other primary thrombophilia: Secondary | ICD-10-CM

## 2024-03-31 DIAGNOSIS — M793 Panniculitis, unspecified: Secondary | ICD-10-CM

## 2024-03-31 DIAGNOSIS — Z8249 Family history of ischemic heart disease and other diseases of the circulatory system: Secondary | ICD-10-CM

## 2024-03-31 NOTE — Progress Notes (Signed)
   Referring Provider Dorrene Gaucher, NP [address] [place],  [place] [address]   CC: No chief complaint on file.   Melissa Santiago is an 58 y.o. female.  HPI: Patient is a 58 year old female who is scheduled for surgery with Dr. Carolynne Citron, however has a significant family history of blood clots.  She was recently diagnosed with protein S deficiency as well as hyper homocystinemia.  She has no personal history of thrombotic event but both her mother and both daughters have history of PE and DVTs.    Her youngest daughter developed extensive PE and DVTs 56 years old after starting OCPs. Her second daughter was diagnosed with PE and DVT several years ago after having gallbladder removed and flying from [place]  to [place] .  She has had surgery in the past, 2 of which were lower back without complications.  Patient was seen by hematology, recommended to start Xarelto  10 mg p.o. daily 7 days prior to surgery, she will stop taking 2 days before surgery and restart the day after surgery for 1 month.  The patient gave consent to have this visit done by telemedicine / virtual visit, two identifiers were used to identify patient. This is also consent for access the chart and treat the patient via this visit. The patient is located in [place].  I, the provider, am at the office.  We spent 8 minutes together for the visit.  Joined by telephone.  Patient reports she is appreciative of the referred to hematology and her PCP for further workup.  She is interested in proceeding with surgery.   Physical Exam    03/22/2024    2:52 PM 03/04/2024    2:35 PM 03/02/2024    2:06 PM  Vitals with BMI  Height 5' 4.5" 5\' 4"  5\' 4"   Weight 213 lbs 2 oz 211 lbs 13 oz 208 lbs 3 oz  BMI 36.03 36.34 35.72  Systolic 110 106 89  Diastolic 67 73 55  Pulse 90 77 97     Assessment/Plan Discussed that we reviewed labs, reviewed notes from oncology and family medicine.  Recommend scheduling patient for  appointment with Dr. Carolynne Citron to further discuss risks versus benefits.  And discuss surgical planning.  Patient was agreeable with this plan.  All of her questions were answered to her content.  We will plan to see her in a few weeks for in person appointment with Dr. Carolynne Citron.  A total of 8 minutes was spent on today's encounter with 8 total minutes spent in direct conversation with patient in regards to her labs, surgical planning.  Janalyn Me Hyla Coard 03/31/2024, 10:42 AM

## 2024-04-13 ENCOUNTER — Encounter: Payer: Self-pay | Admitting: Plastic Surgery

## 2024-04-13 ENCOUNTER — Ambulatory Visit: Admitting: Plastic Surgery

## 2024-04-13 ENCOUNTER — Encounter: Admitting: Physician Assistant

## 2024-04-13 VITALS — BP 100/64 | HR 79

## 2024-04-13 DIAGNOSIS — M793 Panniculitis, unspecified: Secondary | ICD-10-CM

## 2024-04-13 NOTE — Progress Notes (Signed)
 Melissa Santiago returns today for further discussion after her evaluation by hematology.  They feel that she should be on Xarelto  prior to surgery with medication holiday for the procedure and then return to the Xarelto  postoperatively.  I had a long discussion with her regarding acute bleeding during surgery and the typically slightly increased risk of bleeding approximately 10 to 14 days postoperatively.  I believe that she is at a higher than average risk for postoperative bleeding at that point.  If she has bleeding she may require return to the operating room for removal hematoma.  She states that she is having so much difficulty with rashes and pain that she would like to move forward.  By way of example she states that she had itching which she woke up scratching and from this morning.  Additionally when she was last flying which she does frequently for her job she had to leave her seatbelt several times because of the discomfort.  She understands the risks of bleeding.  I agree with her that the risk to benefit leans towards a benefit and we will plan on proceeding with her panniculectomy.

## 2024-05-02 ENCOUNTER — Encounter: Admitting: Surgical

## 2024-05-05 ENCOUNTER — Other Ambulatory Visit: Payer: Self-pay | Admitting: Family

## 2024-05-05 DIAGNOSIS — L299 Pruritus, unspecified: Secondary | ICD-10-CM

## 2024-05-23 ENCOUNTER — Inpatient Hospital Stay: Admitting: Medical Oncology

## 2024-05-23 ENCOUNTER — Inpatient Hospital Stay: Attending: Hematology & Oncology

## 2024-05-23 ENCOUNTER — Encounter: Payer: Self-pay | Admitting: Medical Oncology

## 2024-05-23 ENCOUNTER — Inpatient Hospital Stay

## 2024-05-23 ENCOUNTER — Inpatient Hospital Stay (HOSPITAL_BASED_OUTPATIENT_CLINIC_OR_DEPARTMENT_OTHER): Admitting: Medical Oncology

## 2024-05-23 VITALS — BP 92/62 | HR 80 | Temp 98.3°F | Resp 18 | Ht 64.0 in | Wt 205.8 lb

## 2024-05-23 DIAGNOSIS — Z7982 Long term (current) use of aspirin: Secondary | ICD-10-CM | POA: Diagnosis not present

## 2024-05-23 DIAGNOSIS — E7211 Homocystinuria: Secondary | ICD-10-CM | POA: Diagnosis not present

## 2024-05-23 DIAGNOSIS — Z79899 Other long term (current) drug therapy: Secondary | ICD-10-CM | POA: Insufficient documentation

## 2024-05-23 DIAGNOSIS — D6859 Other primary thrombophilia: Secondary | ICD-10-CM | POA: Insufficient documentation

## 2024-05-23 DIAGNOSIS — Z8249 Family history of ischemic heart disease and other diseases of the circulatory system: Secondary | ICD-10-CM

## 2024-05-23 LAB — CBC WITH DIFFERENTIAL (CANCER CENTER ONLY)
Abs Immature Granulocytes: 0.03 K/uL (ref 0.00–0.07)
Basophils Absolute: 0.1 K/uL (ref 0.0–0.1)
Basophils Relative: 1 %
Eosinophils Absolute: 0.1 K/uL (ref 0.0–0.5)
Eosinophils Relative: 1 %
HCT: 39.3 % (ref 36.0–46.0)
Hemoglobin: 12.8 g/dL (ref 12.0–15.0)
Immature Granulocytes: 0 %
Lymphocytes Relative: 25 %
Lymphs Abs: 1.8 K/uL (ref 0.7–4.0)
MCH: 27.5 pg (ref 26.0–34.0)
MCHC: 32.6 g/dL (ref 30.0–36.0)
MCV: 84.5 fL (ref 80.0–100.0)
Monocytes Absolute: 0.4 K/uL (ref 0.1–1.0)
Monocytes Relative: 6 %
Neutro Abs: 4.7 K/uL (ref 1.7–7.7)
Neutrophils Relative %: 67 %
Platelet Count: 235 K/uL (ref 150–400)
RBC: 4.65 MIL/uL (ref 3.87–5.11)
RDW: 13 % (ref 11.5–15.5)
WBC Count: 7.1 K/uL (ref 4.0–10.5)
nRBC: 0 % (ref 0.0–0.2)

## 2024-05-23 LAB — CMP (CANCER CENTER ONLY)
ALT: 20 U/L (ref 0–44)
AST: 25 U/L (ref 15–41)
Albumin: 4.2 g/dL (ref 3.5–5.0)
Alkaline Phosphatase: 105 U/L (ref 38–126)
Anion gap: 12 (ref 5–15)
BUN: 12 mg/dL (ref 6–20)
CO2: 24 mmol/L (ref 22–32)
Calcium: 9.4 mg/dL (ref 8.9–10.3)
Chloride: 104 mmol/L (ref 98–111)
Creatinine: 0.72 mg/dL (ref 0.44–1.00)
GFR, Estimated: >60 mL/min (ref >60)
Glucose, Bld: 92 mg/dL (ref 70–99)
Potassium: 4.1 mmol/L (ref 3.5–5.1)
Sodium: 140 mmol/L (ref 135–145)
Total Bilirubin: 0.2 mg/dL (ref 0.0–1.2)
Total Protein: 6.5 g/dL (ref 6.5–8.1)

## 2024-05-23 NOTE — Progress Notes (Signed)
 Hematology and Oncology Follow Up Visit  Melissa Santiago [id number] 10/16/1966 58 y.o. 05/23/2024  Past Medical History:  Diagnosis Date   Acute lumbar radiculopathy 06/29/2013   Chest pain in adult 05/24/2018   Chronic constipation 05/24/2018   Gastroesophageal reflux disease 05/24/2018   GERD (gastroesophageal reflux disease)    Left sided sciatica 05/10/2013   Lumbar disc herniation 06/29/2013   Morbid obesity (HCC) 05/24/2018   Obesity    Osteopenia 03/23/2015   S/P bariatric surgery 06/30/2013    Principle Diagnosis:  Hypercoagulable state without history of thromboembolic events  Hyperhomocysteinemia Protein S Deficiency   Current Therapy:   81 mg asa daily Prior to surgeries: Xarelto  10 mg daily 7 days prior. Holding 2 days prior to procedure. Restarting 24 hours after and then continuing for 30 days.     Interim History:  Melissa Santiago is back for follow-up for hypercoagulable state:   Patient reports that she has been doing well since her first visit to our clinic.   She has her upcoming panniculectomy scheduled for Sept 8th. She is excited for this procedure.   There has been no bleeding to her knowledge: denies epistaxis, gingivitis, hemoptysis, hematemesis, hematuria, melena, excessive bruising, blood donation.   No personal history of thromboembolic events.   Wt Readings from Last 3 Encounters:  05/23/24 205 lb 12.8 oz (93.4 kg)  03/22/24 213 lb 1.9 oz (96.7 kg)  03/04/24 211 lb 12.8 oz (96.1 kg)     Medications:   Current Outpatient Medications:    buPROPion  (WELLBUTRIN  XL) 150 MG 24 hr tablet, TAKE 1 TABLET BY MOUTH EVERY DAY, Disp: 90 tablet, Rfl: 1   cyanocobalamin  (VITAMIN B12) 1000 MCG/ML injection, Inject 1mL into muscle once weekly for 4 weeks, then 1 mL once monthly, Disp: 10 mL, Rfl: 0   DULoxetine  (CYMBALTA ) 60 MG capsule, TAKE 1 CAPSULE BY MOUTH EVERY DAY, Disp: 90 capsule, Rfl: 1   folic acid  (FOLVITE ) 1 MG tablet, Take 1 tablet (1 mg total) by mouth  daily., Disp: 30 tablet, Rfl: 11   hydrOXYzine  (VISTARIL ) 25 MG capsule, TAKE 1 CAPSULE (25 MG TOTAL) BY MOUTH EVERY 8 (EIGHT) HOURS AS NEEDED., Disp: 30 capsule, Rfl: 2   melatonin 3 MG TABS tablet, Take 3 mg by mouth at bedtime as needed., Disp: , Rfl:    Menthol, Topical Analgesic, 4 % GEL, Apply 1 Application topically as needed., Disp: , Rfl:    mupirocin  ointment (BACTROBAN ) 2 %, Apply 1 Application topically 2 (two) times daily., Disp: 22 g, Rfl: 0   NEEDLE, DISP, 25 G (B-D DISP NEEDLE 25GX1) 25G X 1 MISC, For B12 inj, Disp: 50 each, Rfl: 1   nystatin  powder, Apply twice daily as needed to skin fold, Disp: 60 g, Rfl: 1   rivaroxaban  (XARELTO ) 10 MG TABS tablet, Take 1 tablet (10 mg total) by mouth daily. Start taking 1 week prior to surgery. Stop 2 days before surgery and restart the day after for 1 month., Disp: 30 tablet, Rfl: 1   SYRINGE-NEEDLE, DISP, 3 ML 25G X 1 3 ML MISC, For B12 injection, Disp: 50 each, Rfl: 1   valACYclovir  (VALTREX ) 500 MG tablet, TAKE 1 TABLET (500 MG TOTAL) BY MOUTH DAILY., Disp: 90 tablet, Rfl: 1  Allergies:  Allergies  Allergen Reactions   Tolmetin Hives, Itching and Rash    Past Medical History, Surgical history, Social history, and Family History were reviewed and updated.  Review of Systems: Review of Systems  All other systems  reviewed and are negative.    Physical Exam:  height is 5' 4 (1.626 m) and weight is 205 lb 12.8 oz (93.4 kg). Her oral temperature is 98.3 F (36.8 C). Her blood pressure is 92/62 and her pulse is 80. Her respiration is 18 and oxygen saturation is 98%.   Physical Exam General: NAD Cardiovascular: regular rate and rhythm Pulmonary: clear ant fields Abdomen: soft, nontender, + bowel sounds GU: no suprapubic tenderness Extremities: no edema, no joint deformities Skin: no rashes Neurological: Weakness but otherwise nonfocal   Lab Results  Component Value Date   WBC 7.1 05/23/2024   HGB 12.8 05/23/2024   HCT  39.3 05/23/2024   MCV 84.5 05/23/2024   PLT 235 05/23/2024     Chemistry      Component Value Date/Time   NA 140 05/23/2024 1441   NA 141 06/10/2018 1537   K 4.1 05/23/2024 1441   CL 104 05/23/2024 1441   CO2 24 05/23/2024 1441   BUN 12 05/23/2024 1441   BUN 18 06/10/2018 1537   CREATININE 0.72 05/23/2024 1441   CREATININE 0.65 03/04/2024 1523      Component Value Date/Time   CALCIUM  9.4 05/23/2024 1441   ALKPHOS 105 05/23/2024 1441   AST 25 05/23/2024 1441   ALT 20 05/23/2024 1441   BILITOT 0.2 05/23/2024 1441     Encounter Diagnoses  Name Primary?   Hyperhomocysteinemia (HCC)    Protein S deficiency (HCC)    Primary hypercoagulable state (HCC) Yes    Assessment and Plan- Patient is a 58 y.o. female who we follow for hyperhomocysteinemia and protein s deficiency. She has never had a clotting event but has a significant family history of thromboembolic disease. She currently takes 1 mg of folic acid  PO daily.   She has an upcoming Panniculectomy with plan for her to start Xarelto  10 mg PO 7 days prior to surgery. She will stop taking 2 days before surgery and restart the day after surgery for 1 month.   She will take daily 81 mg asa outside of this timeframe for prevention and will consider taking a 325 mg asa for 5 days before and 5 days after periods of immobility/flights.   RTC 12 months carter, no labs   Disposition: RTC 12 months APP, no labs   Lauraine Dais PA-C 7/28/20253:50 PM

## 2024-05-24 LAB — PROTEIN S ACTIVITY: Protein S Activity: 37 % — ABNORMAL LOW (ref 63–140)

## 2024-05-24 LAB — HOMOCYSTEINE: Homocysteine: 11.1 umol/L (ref 0.0–14.5)

## 2024-05-24 LAB — PROTEIN S, TOTAL: Protein S Ag, Total: 53 % — ABNORMAL LOW (ref 60–150)

## 2024-05-26 ENCOUNTER — Ambulatory Visit: Payer: Self-pay | Admitting: Medical Oncology

## 2024-06-08 ENCOUNTER — Other Ambulatory Visit: Payer: Self-pay | Admitting: Family

## 2024-06-18 ENCOUNTER — Other Ambulatory Visit: Payer: Self-pay | Admitting: Family

## 2024-06-23 ENCOUNTER — Ambulatory Visit (INDEPENDENT_AMBULATORY_CARE_PROVIDER_SITE_OTHER): Admitting: Student

## 2024-06-23 VITALS — BP 105/73 | HR 82 | Ht 64.5 in | Wt 202.4 lb

## 2024-06-23 DIAGNOSIS — M793 Panniculitis, unspecified: Secondary | ICD-10-CM

## 2024-06-23 MED ORDER — ONDANSETRON HCL 4 MG PO TABS: 4.0000 mg | ORAL_TABLET | Freq: Three times a day (TID) | ORAL | 0 refills | Status: DC | PRN

## 2024-06-23 MED ORDER — OXYCODONE HCL 5 MG PO TABS: 5.0000 mg | ORAL_TABLET | Freq: Four times a day (QID) | ORAL | 0 refills | Status: DC | PRN

## 2024-06-23 NOTE — Progress Notes (Signed)
 Patient ID: Melissa Santiago, female    DOB: 03/14/1966, 58 y.o.   MRN: [id number]  Chief Complaint  Patient presents with   Pre-op Exam      ICD-10-CM   1. Panniculitis  M79.3        History of Present Illness: Melissa Santiago is a 58 y.o.  female  with a history of panniculitis.  She presents for preoperative evaluation for upcoming procedure, panniculectomy, scheduled for 07/04/2024 with Dr. Waddell.  The patient has not had problems with anesthesia.  Patient denies any history of cardiac disease.  She does state that she takes a baby aspirin given history of protein S deficiency.  She states that she is planning on starting Xarelto  1 week prior to surgery, holding it 2 days prior to surgery, and resuming it 1 day after surgery and planning on taking it for 1 month based on the instructions of her hematologist.  Patient reports she is not a smoker.  Patient denies taking any hormone replacement or birth control.  She denies any history of greater than 3 miscarriages.  She denies any personal history of blood clots.  She does report both of her daughters and her mother have had blood clots.  She does report history of protein S deficiency.  She denies any recent surgeries, traumas or infections.  She denies any history of stroke or heart attack.  Patient denies any history of Crohn's disease or ulcerative colitis, COPD or asthma.  She denies any history of cancer.  She denies any varicosities to her lower extremities.  She denies any recent fevers, chills or changes in her health.  Summary of Previous Visit: Patient was seen by Dr. Waddell initially on 01/13/2024.  At this visit, patient reported she underwent bariatric surgery in 2014 and had an 80 pound weight loss.  Patient reported she had ongoing rashes on the posterior aspect of her pannus which interfered with her activities of daily life.  Patient was then seen again in the office for preoperative appointment on 03/02/2024.  At this  visit, patient reported that both of her daughters had had DVTs and pulmonary emboli.  She had reported that her daughter was recently diagnosed with protein S deficiency.  It was recommended that patient undergo hematology workup given extensive family history of blood clots.  Patient was then seen again on 03/31/2024.  At this visit, it was noted that patient was recently diagnosed with protein S deficiency as well as hyperhomocysteinemia.  Patient had been seen by hematology who recommended patient start Xarelto  10 mg p.o. daily 7 days prior to surgery, and patient would stop taking 2 days prior to surgery and restart the day after surgery for 1 month.  Patient was then seen by Dr. Waddell on 04/13/2024.  At that visit, Dr. Waddell had a long discussion with the patient in regards to acute bleeding during the surgery and slightly increased risk of bleeding approximately 10 to 14 days postoperatively.  Patient stated that she wanted to move forward.  Patient understood the risks of bleeding.  Per chart review, patient was seen by hematology on 05/23/24.  Per hematology note She has an upcoming Panniculectomy with plan for her to start Xarelto  10 mg PO 7 days prior to surgery. 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  Planning to take 9 days off.  PMH  Significant for: GERD, protein S deficiency, anxiety, depression, panniculitis   Past Medical History: Allergies: Allergies  Allergen Reactions   Tolmetin Hives, Itching and Rash    Current Medications:  Current Outpatient Medications:    buPROPion  (WELLBUTRIN  XL) 150 MG 24 hr tablet, TAKE 1 TABLET BY MOUTH EVERY DAY, Disp: 90 tablet, Rfl: 1   cyanocobalamin  (VITAMIN B12) 1000 MCG/ML injection, INJECT 1ML INTO MUSCLE ONCE WEEKLY FOR 4 WEEKS, THEN 1 ML ONCE MONTHLY, Disp: 6 mL, Rfl: 1   DULoxetine  (CYMBALTA ) 60 MG capsule, TAKE 1 CAPSULE BY  MOUTH EVERY DAY, Disp: 90 capsule, Rfl: 1   folic acid  (FOLVITE ) 1 MG tablet, Take 1 tablet (1 mg total) by mouth daily., Disp: 30 tablet, Rfl: 11   hydrOXYzine  (VISTARIL ) 25 MG capsule, TAKE 1 CAPSULE (25 MG TOTAL) BY MOUTH EVERY 8 (EIGHT) HOURS AS NEEDED., Disp: 30 capsule, Rfl: 2   melatonin 3 MG TABS tablet, Take 3 mg by mouth at bedtime as needed., Disp: , Rfl:    Menthol, Topical Analgesic, 4 % GEL, Apply 1 Application topically as needed., Disp: , Rfl:    NEEDLE, DISP, 25 G (B-D DISP NEEDLE 25GX1) 25G X 1 MISC, For B12 inj, Disp: 50 each, Rfl: 1   nystatin  powder, Apply twice daily as needed to skin fold, Disp: 60 g, Rfl: 1   rivaroxaban  (XARELTO ) 10 MG TABS tablet, Take 1 tablet (10 mg total) by mouth daily. Start taking 1 week prior to surgery. Stop 2 days before surgery and restart the day after for 1 month., Disp: 30 tablet, Rfl: 1   valACYclovir  (VALTREX ) 500 MG tablet, TAKE 1 TABLET (500 MG TOTAL) BY MOUTH DAILY., Disp: 90 tablet, Rfl: 1   mupirocin  ointment (BACTROBAN ) 2 %, Apply 1 Application topically 2 (two) times daily., Disp: 22 g, Rfl: 0   SYRINGE-NEEDLE, DISP, 3 ML 25G X 1 3 ML MISC, For B12 injection, Disp: 50 each, Rfl: 1  Past Medical Problems: Past Medical History:  Diagnosis Date   Acute lumbar radiculopathy 06/29/2013   Chest pain in adult 05/24/2018   Chronic constipation 05/24/2018   Gastroesophageal reflux disease 05/24/2018   GERD (gastroesophageal reflux disease)    Left sided sciatica 05/10/2013   Lumbar disc herniation 06/29/2013   Morbid obesity (HCC) 05/24/2018   Obesity    Osteopenia 03/23/2015   S/P bariatric surgery 06/30/2013    Past Surgical History: Past Surgical History:  Procedure Laterality Date   BREAST BIOPSY Left 2014   CHOLECYSTECTOMY  2022   LAPAROSCOPIC GASTRIC SLEEVE RESECTION  2014   LUMBAR DISC SURGERY  2014/2016    Social History: Social History   Socioeconomic History   Marital status: Married    Spouse name: Franky   Number of  children: 2   Years of education: Not on file   Highest education level: Bachelor's degree (e.g., BA, AB, BS)  Occupational History   Occupation: Nature conservation officer  Tobacco Use   Smoking status: Former    Current packs/day: 0.00    Average packs/day: 0.5 packs/day for 15.0 years (7.5 ttl pk-yrs)    Types: Cigarettes    Start date: 62    Quit date: 2006    Years since quitting: 19.6   Smokeless tobacco: Never  Vaping Use   Vaping status: Never Used  Substance and Sexual Activity   Alcohol use: Yes    Comment: rarely   Drug use: Never   Sexual activity: Yes    Partners: Male    Birth  control/protection: I.U.D.  Other Topics Concern   Not on file  Social History Narrative   From [place] , Divorced since 2015   Works as a Research officer, political party for [employer]   Remarried   2 daughters- one in [employer]- lives in [place] , one in [place] - works as a Runner, broadcasting/film/video   Enjoys spending time with her husband   Has a dog and will adopt another soon.    Social Drivers of Corporate investment banker Strain: Low Risk  (03/04/2024)   Overall Financial Resource Strain (CARDIA)    Difficulty of Paying Living Expenses: Not hard at all  Food Insecurity: No Food Insecurity (03/22/2024)   Hunger Vital Sign    Worried About Running Out of Food in the Last Year: Never true    Ran Out of Food in the Last Year: Never true  Transportation Needs: No Transportation Needs (03/22/2024)   PRAPARE - Administrator, Civil Service (Medical): No    Lack of Transportation (Non-Medical): No  Physical Activity: Inactive (03/04/2024)   Exercise Vital Sign    Days of Exercise per Week: 1 day    Minutes of Exercise per Session: 0 min  Stress: Stress Concern Present (03/04/2024)   [employer] of Occupational Health - Occupational Stress Questionnaire    Feeling of Stress : Rather much  Social Connections: Socially Integrated (03/04/2024)   Social Connection and Isolation Panel    Frequency of Communication with  Friends and Family: More than three times a week    Frequency of Social Gatherings with Friends and Family: Once a week    Attends Religious Services: More than 4 times per year    Active Member of [employer] or Organizations: Yes    Attends Engineer, structural: More than 4 times per year    Marital Status: Married  Catering manager Violence: Not At Risk (03/22/2024)   Humiliation, Afraid, Rape, and Kick questionnaire    Fear of Current or Ex-Partner: No    Emotionally Abused: No    Physically Abused: No    Sexually Abused: No    Family History: Family History  Problem Relation Age of Onset   Arthritis Mother    Early death Mother    Mental illness Mother    Alcohol abuse Father    Arthritis Father    Early death Sister    Thyroid  cancer Sister    Hypertension Sister    Heart attack Maternal Grandfather    Uterine cancer Sister    Hypertension Sister    Cervical cancer Sister    Depression Sister    Hypertension Sister    Colon cancer Neg Hx    Esophageal cancer Neg Hx    Breast cancer Neg Hx    Rectal cancer Neg Hx    Stomach cancer Neg Hx     Review of Systems: Denies any recent fevers, chills or changes in her health  Physical Exam: Vital Signs BP 105/73 (BP Location: Left Arm, Patient Position: Sitting, Cuff Size: Large)   Pulse 82   Ht 5' 4.5 (1.638 m)   Wt 202 lb 6.4 oz (91.8 kg)   SpO2 97%   BMI 34.21 kg/m   Physical Exam  Constitutional:      General: Not in acute distress.    Appearance: Normal appearance. Not ill-appearing.  HENT:     Head: Normocephalic and atraumatic.  Neck:     Musculoskeletal: Normal range of motion.  Cardiovascular:     Rate  and Rhythm: Normal rate Pulmonary:     Effort: Pulmonary effort is normal. No respiratory distress.  Abdominal:     General: Abdomen is flat. There is no distension.  Musculoskeletal: Normal range of motion.  Skin:    General: Skin is warm and dry.     Findings: No erythema or rash.   Neurological:     Mental Status: Alert and oriented to person, place, and time. Mental status is at baseline.  Psychiatric:        Mood and Affect: Mood normal.        Behavior: Behavior normal.    Assessment/Plan: The patient is scheduled for panniculectomy with Dr. Waddell.  Risks, benefits, and alternatives of procedure discussed, questions answered and consent obtained.    Smoking Status: Non-smoker; Counseling Given?  N/A  Caprini Score: 10; Risk Factors include: Age, BMI greater than 25, family history of thrombosis, history of protein S deficiency and length of planned surgery. Recommendation for mechanical and pharmacological prophylaxis. Encourage early ambulation.  Patient takes Xarelto  as prescribed by hematology for clotting prevention.  Pictures obtained: @consult   Post-op Rx sent to pharmacy: Oxycodone , Zofran   I discussed with the patient to not take her hydroxyzine  or her Cymbalta  at the same time as any pain pill as these can be sedating.  Patient expressed understanding.  Recommended that she hold any multivitamins or supplements at least 1 week prior to surgery.  She expressed understanding.  Patient states that she understands the instructions given by her hematologist for Xarelto .  She also states that she is going to stop taking the aspirin when she starts taking Xarelto .  Patient was provided with the General Surgical Risk consent document and Pain Medication Agreement prior to their appointment.  They had adequate time to read through the risk consent documents and Pain Medication Agreement. We also discussed them in person together during this preop appointment. All of their questions were answered to their satisfaction.  Recommended calling if they have any further questions.  Risk consent form and Pain Medication Agreement to be scanned into patient's chart.  The consent was obtained with risks and complications reviewed which included bleeding, pain, scar,  infection and the risk of anesthesia.  We discussed her increased risk of bleeding given the Xarelto  she will be taking.  She expressed understanding.  The patients questions were answered to the patients expressed satisfaction.    Electronically signed by: Estefana FORBES Peck, PA-C 06/23/2024 11:41 AM

## 2024-06-23 NOTE — H&P (View-Only) (Signed)
 Patient ID: Melissa Santiago, female    DOB: 03/14/1966, 58 y.o.   MRN: [id number]  Chief Complaint  Patient presents with   Pre-op Exam      ICD-10-CM   1. Panniculitis  M79.3        History of Present Illness: Melissa Santiago is a 58 y.o.  female  with a history of panniculitis.  She presents for preoperative evaluation for upcoming procedure, panniculectomy, scheduled for 07/04/2024 with Dr. Waddell.  The patient has not had problems with anesthesia.  Patient denies any history of cardiac disease.  She does state that she takes a baby aspirin given history of protein S deficiency.  She states that she is planning on starting Xarelto  1 week prior to surgery, holding it 2 days prior to surgery, and resuming it 1 day after surgery and planning on taking it for 1 month based on the instructions of her hematologist.  Patient reports she is not a smoker.  Patient denies taking any hormone replacement or birth control.  She denies any history of greater than 3 miscarriages.  She denies any personal history of blood clots.  She does report both of her daughters and her mother have had blood clots.  She does report history of protein S deficiency.  She denies any recent surgeries, traumas or infections.  She denies any history of stroke or heart attack.  Patient denies any history of Crohn's disease or ulcerative colitis, COPD or asthma.  She denies any history of cancer.  She denies any varicosities to her lower extremities.  She denies any recent fevers, chills or changes in her health.  Summary of Previous Visit: Patient was seen by Dr. Waddell initially on 01/13/2024.  At this visit, patient reported she underwent bariatric surgery in 2014 and had an 80 pound weight loss.  Patient reported she had ongoing rashes on the posterior aspect of her pannus which interfered with her activities of daily life.  Patient was then seen again in the office for preoperative appointment on 03/02/2024.  At this  visit, patient reported that both of her daughters had had DVTs and pulmonary emboli.  She had reported that her daughter was recently diagnosed with protein S deficiency.  It was recommended that patient undergo hematology workup given extensive family history of blood clots.  Patient was then seen again on 03/31/2024.  At this visit, it was noted that patient was recently diagnosed with protein S deficiency as well as hyperhomocysteinemia.  Patient had been seen by hematology who recommended patient start Xarelto  10 mg p.o. daily 7 days prior to surgery, and patient would stop taking 2 days prior to surgery and restart the day after surgery for 1 month.  Patient was then seen by Dr. Waddell on 04/13/2024.  At that visit, Dr. Waddell had a long discussion with the patient in regards to acute bleeding during the surgery and slightly increased risk of bleeding approximately 10 to 14 days postoperatively.  Patient stated that she wanted to move forward.  Patient understood the risks of bleeding.  Per chart review, patient was seen by hematology on 05/23/24.  Per hematology note She has an upcoming Panniculectomy with plan for her to start Xarelto  10 mg PO 7 days prior to surgery. 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  Planning to take 9 days off.  PMH  Significant for: GERD, protein S deficiency, anxiety, depression, panniculitis   Past Medical History: Allergies: Allergies  Allergen Reactions   Tolmetin Hives, Itching and Rash    Current Medications:  Current Outpatient Medications:    buPROPion  (WELLBUTRIN  XL) 150 MG 24 hr tablet, TAKE 1 TABLET BY MOUTH EVERY DAY, Disp: 90 tablet, Rfl: 1   cyanocobalamin  (VITAMIN B12) 1000 MCG/ML injection, INJECT 1ML INTO MUSCLE ONCE WEEKLY FOR 4 WEEKS, THEN 1 ML ONCE MONTHLY, Disp: 6 mL, Rfl: 1   DULoxetine  (CYMBALTA ) 60 MG capsule, TAKE 1 CAPSULE BY  MOUTH EVERY DAY, Disp: 90 capsule, Rfl: 1   folic acid  (FOLVITE ) 1 MG tablet, Take 1 tablet (1 mg total) by mouth daily., Disp: 30 tablet, Rfl: 11   hydrOXYzine  (VISTARIL ) 25 MG capsule, TAKE 1 CAPSULE (25 MG TOTAL) BY MOUTH EVERY 8 (EIGHT) HOURS AS NEEDED., Disp: 30 capsule, Rfl: 2   melatonin 3 MG TABS tablet, Take 3 mg by mouth at bedtime as needed., Disp: , Rfl:    Menthol, Topical Analgesic, 4 % GEL, Apply 1 Application topically as needed., Disp: , Rfl:    NEEDLE, DISP, 25 G (B-D DISP NEEDLE 25GX1) 25G X 1 MISC, For B12 inj, Disp: 50 each, Rfl: 1   nystatin  powder, Apply twice daily as needed to skin fold, Disp: 60 g, Rfl: 1   rivaroxaban  (XARELTO ) 10 MG TABS tablet, Take 1 tablet (10 mg total) by mouth daily. Start taking 1 week prior to surgery. Stop 2 days before surgery and restart the day after for 1 month., Disp: 30 tablet, Rfl: 1   valACYclovir  (VALTREX ) 500 MG tablet, TAKE 1 TABLET (500 MG TOTAL) BY MOUTH DAILY., Disp: 90 tablet, Rfl: 1   mupirocin  ointment (BACTROBAN ) 2 %, Apply 1 Application topically 2 (two) times daily., Disp: 22 g, Rfl: 0   SYRINGE-NEEDLE, DISP, 3 ML 25G X 1 3 ML MISC, For B12 injection, Disp: 50 each, Rfl: 1  Past Medical Problems: Past Medical History:  Diagnosis Date   Acute lumbar radiculopathy 06/29/2013   Chest pain in adult 05/24/2018   Chronic constipation 05/24/2018   Gastroesophageal reflux disease 05/24/2018   GERD (gastroesophageal reflux disease)    Left sided sciatica 05/10/2013   Lumbar disc herniation 06/29/2013   Morbid obesity (HCC) 05/24/2018   Obesity    Osteopenia 03/23/2015   S/P bariatric surgery 06/30/2013    Past Surgical History: Past Surgical History:  Procedure Laterality Date   BREAST BIOPSY Left 2014   CHOLECYSTECTOMY  2022   LAPAROSCOPIC GASTRIC SLEEVE RESECTION  2014   LUMBAR DISC SURGERY  2014/2016    Social History: Social History   Socioeconomic History   Marital status: Married    Spouse name: Franky   Number of  children: 2   Years of education: Not on file   Highest education level: Bachelor's degree (e.g., BA, AB, BS)  Occupational History   Occupation: Nature conservation officer  Tobacco Use   Smoking status: Former    Current packs/day: 0.00    Average packs/day: 0.5 packs/day for 15.0 years (7.5 ttl pk-yrs)    Types: Cigarettes    Start date: 62    Quit date: 2006    Years since quitting: 19.6   Smokeless tobacco: Never  Vaping Use   Vaping status: Never Used  Substance and Sexual Activity   Alcohol use: Yes    Comment: rarely   Drug use: Never   Sexual activity: Yes    Partners: Male    Birth  control/protection: I.U.D.  Other Topics Concern   Not on file  Social History Narrative   From [place] , Divorced since 2015   Works as a Research officer, political party for [employer]   Remarried   2 daughters- one in [employer]- lives in [place] , [place] in [place] - works as a Runner, broadcasting/film/video   Enjoys spending time with her husband   Has a dog and will adopt another soon.    Social Drivers of Corporate investment banker Strain: Low Risk  (03/04/2024)   Overall Financial Resource Strain (CARDIA)    Difficulty of Paying Living Expenses: Not hard at all  Food Insecurity: No Food Insecurity (03/22/2024)   Hunger Vital Sign    Worried About Running Out of Food in the Last Year: Never true    Ran Out of Food in the Last Year: Never true  Transportation Needs: No Transportation Needs (03/22/2024)   PRAPARE - Administrator, Civil Service (Medical): No    Lack of Transportation (Non-Medical): No  Physical Activity: Inactive (03/04/2024)   Exercise Vital Sign    Days of Exercise per Week: 1 day    Minutes of Exercise per Session: 0 min  Stress: Stress Concern Present (03/04/2024)   [employer] of Occupational Health - Occupational Stress Questionnaire    Feeling of Stress : Rather much  Social Connections: Socially Integrated (03/04/2024)   Social Connection and Isolation Panel    Frequency of Communication with  Friends and Family: More than three times a week    Frequency of Social Gatherings with Friends and Family: Once a week    Attends Religious Services: More than 4 times per year    Active Member of [employer] or Organizations: Yes    Attends Engineer, structural: More than 4 times per year    Marital Status: Married  Catering manager Violence: Not At Risk (03/22/2024)   Humiliation, Afraid, Rape, and Kick questionnaire    Fear of Current or Ex-Partner: No    Emotionally Abused: No    Physically Abused: No    Sexually Abused: No    Family History: Family History  Problem Relation Age of Onset   Arthritis Mother    Early death Mother    Mental illness Mother    Alcohol abuse Father    Arthritis Father    Early death Sister    Thyroid  cancer Sister    Hypertension Sister    Heart attack Maternal Grandfather    Uterine cancer Sister    Hypertension Sister    Cervical cancer Sister    Depression Sister    Hypertension Sister    Colon cancer Neg Hx    Esophageal cancer Neg Hx    Breast cancer Neg Hx    Rectal cancer Neg Hx    Stomach cancer Neg Hx     Review of Systems: Denies any recent fevers, chills or changes in her health  Physical Exam: Vital Signs BP 105/73 (BP Location: Left Arm, Patient Position: Sitting, Cuff Size: Large)   Pulse 82   Ht 5' 4.5 (1.638 m)   Wt 202 lb 6.4 oz (91.8 kg)   SpO2 97%   BMI 34.21 kg/m   Physical Exam  Constitutional:      General: Not in acute distress.    Appearance: Normal appearance. Not ill-appearing.  HENT:     Head: Normocephalic and atraumatic.  Neck:     Musculoskeletal: Normal range of motion.  Cardiovascular:     Rate  and Rhythm: Normal rate Pulmonary:     Effort: Pulmonary effort is normal. No respiratory distress.  Abdominal:     General: Abdomen is flat. There is no distension.  Musculoskeletal: Normal range of motion.  Skin:    General: Skin is warm and dry.     Findings: No erythema or rash.   Neurological:     Mental Status: Alert and oriented to person, place, and time. Mental status is at baseline.  Psychiatric:        Mood and Affect: Mood normal.        Behavior: Behavior normal.    Assessment/Plan: The patient is scheduled for panniculectomy with Dr. Waddell.  Risks, benefits, and alternatives of procedure discussed, questions answered and consent obtained.    Smoking Status: Non-smoker; Counseling Given?  N/A  Caprini Score: 10; Risk Factors include: Age, BMI greater than 25, family history of thrombosis, history of protein S deficiency and length of planned surgery. Recommendation for mechanical and pharmacological prophylaxis. Encourage early ambulation.  Patient takes Xarelto  as prescribed by hematology for clotting prevention.  Pictures obtained: @consult   Post-op Rx sent to pharmacy: Oxycodone , Zofran   I discussed with the patient to not take her hydroxyzine  or her Cymbalta  at the same time as any pain pill as these can be sedating.  Patient expressed understanding.  Recommended that she hold any multivitamins or supplements at least 1 week prior to surgery.  She expressed understanding.  Patient states that she understands the instructions given by her hematologist for Xarelto .  She also states that she is going to stop taking the aspirin when she starts taking Xarelto .  Patient was provided with the General Surgical Risk consent document and Pain Medication Agreement prior to their appointment.  They had adequate time to read through the risk consent documents and Pain Medication Agreement. We also discussed them in person together during this preop appointment. All of their questions were answered to their satisfaction.  Recommended calling if they have any further questions.  Risk consent form and Pain Medication Agreement to be scanned into patient's chart.  The consent was obtained with risks and complications reviewed which included bleeding, pain, scar,  infection and the risk of anesthesia.  We discussed her increased risk of bleeding given the Xarelto  she will be taking.  She expressed understanding.  The patients questions were answered to the patients expressed satisfaction.    Electronically signed by: Estefana FORBES Peck, PA-C 06/23/2024 11:41 AM

## 2024-06-24 ENCOUNTER — Encounter (HOSPITAL_BASED_OUTPATIENT_CLINIC_OR_DEPARTMENT_OTHER): Payer: Self-pay | Admitting: Plastic Surgery

## 2024-07-01 ENCOUNTER — Telehealth

## 2024-07-04 ENCOUNTER — Other Ambulatory Visit: Payer: Self-pay

## 2024-07-04 ENCOUNTER — Ambulatory Visit (HOSPITAL_BASED_OUTPATIENT_CLINIC_OR_DEPARTMENT_OTHER)
Admission: RE | Admit: 2024-07-04 | Discharge: 2024-07-04 | Disposition: A | Discharge status: Home / Self Care | Attending: Plastic Surgery | Admitting: Plastic Surgery

## 2024-07-04 ENCOUNTER — Encounter (HOSPITAL_BASED_OUTPATIENT_CLINIC_OR_DEPARTMENT_OTHER)
Admission: RE | Disposition: A | Discharge status: Home / Self Care | Payer: Self-pay | Source: Home / Self Care | Attending: Plastic Surgery

## 2024-07-04 ENCOUNTER — Encounter (HOSPITAL_BASED_OUTPATIENT_CLINIC_OR_DEPARTMENT_OTHER): Payer: Self-pay | Admitting: Plastic Surgery

## 2024-07-04 ENCOUNTER — Ambulatory Visit (HOSPITAL_BASED_OUTPATIENT_CLINIC_OR_DEPARTMENT_OTHER): Admitting: Anesthesiology

## 2024-07-04 DIAGNOSIS — F419 Anxiety disorder, unspecified: Secondary | ICD-10-CM | POA: Insufficient documentation

## 2024-07-04 DIAGNOSIS — M793 Panniculitis, unspecified: Secondary | ICD-10-CM | POA: Insufficient documentation

## 2024-07-04 DIAGNOSIS — F32A Depression, unspecified: Secondary | ICD-10-CM | POA: Insufficient documentation

## 2024-07-04 DIAGNOSIS — D6859 Other primary thrombophilia: Secondary | ICD-10-CM | POA: Insufficient documentation

## 2024-07-04 DIAGNOSIS — E7211 Homocystinuria: Secondary | ICD-10-CM | POA: Insufficient documentation

## 2024-07-04 DIAGNOSIS — Z9884 Bariatric surgery status: Secondary | ICD-10-CM | POA: Insufficient documentation

## 2024-07-04 DIAGNOSIS — K219 Gastro-esophageal reflux disease without esophagitis: Secondary | ICD-10-CM | POA: Diagnosis not present

## 2024-07-04 HISTORY — PX: PANNICULECTOMY: SHX5360

## 2024-07-04 SURGERY — PANNICULECTOMY
Anesthesia: General | Site: Abdomen

## 2024-07-04 MED ORDER — MIDAZOLAM HCL 2 MG/2ML IJ SOLN
INTRAMUSCULAR | Status: AC
  Filled 2024-07-04: qty 2

## 2024-07-04 MED ORDER — PHENYLEPHRINE HCL-NACL 20-0.9 MG/250ML-% IV SOLN
INTRAVENOUS | Status: DC | PRN
  Administered 2024-07-04: 30 ug/min via INTRAVENOUS

## 2024-07-04 MED ORDER — DEXMEDETOMIDINE HCL IN NACL 80 MCG/20ML IV SOLN
INTRAVENOUS | Status: DC | PRN
  Administered 2024-07-04: 6 ug via INTRAVENOUS

## 2024-07-04 MED ORDER — ONDANSETRON HCL 4 MG/2ML IJ SOLN
INTRAMUSCULAR | Status: AC
  Filled 2024-07-04: qty 2

## 2024-07-04 MED ORDER — FENTANYL CITRATE (PF) 100 MCG/2ML IJ SOLN
25.0000 ug | INTRAMUSCULAR | Status: DC | PRN
  Administered 2024-07-04: 50 ug via INTRAVENOUS

## 2024-07-04 MED ORDER — PHENYLEPHRINE HCL-NACL 20-0.9 MG/250ML-% IV SOLN: INTRAVENOUS | Status: DC | PRN

## 2024-07-04 MED ORDER — PHENYLEPHRINE 80 MCG/ML (10ML) SYRINGE FOR IV PUSH (FOR BLOOD PRESSURE SUPPORT)
PREFILLED_SYRINGE | INTRAVENOUS | Status: AC
  Filled 2024-07-04: qty 10

## 2024-07-04 MED ORDER — MIDAZOLAM HCL 5 MG/5ML IJ SOLN
INTRAMUSCULAR | Status: DC | PRN
  Administered 2024-07-04: 2 mg via INTRAVENOUS

## 2024-07-04 MED ORDER — DEXAMETHASONE SODIUM PHOSPHATE 10 MG/ML IJ SOLN
INTRAMUSCULAR | Status: DC | PRN
  Administered 2024-07-04: 10 mg via INTRAVENOUS

## 2024-07-04 MED ORDER — CEFAZOLIN SODIUM-DEXTROSE 2-3 GM-%(50ML) IV SOLR
INTRAVENOUS | Status: DC | PRN
  Administered 2024-07-04: 2 g via INTRAVENOUS

## 2024-07-04 MED ORDER — PROPOFOL 10 MG/ML IV BOLUS
INTRAVENOUS | Status: DC | PRN
  Administered 2024-07-04: 150 mg via INTRAVENOUS
  Administered 2024-07-04: 50 mg via INTRAVENOUS

## 2024-07-04 MED ORDER — PROPOFOL 1000 MG/100ML IV EMUL
INTRAVENOUS | Status: AC
Start: 2024-07-04 — End: 2024-07-04
  Filled 2024-07-04: qty 100

## 2024-07-04 MED ORDER — LACTATED RINGERS IV SOLN: INTRAVENOUS | Status: DC

## 2024-07-04 MED ORDER — OXYCODONE HCL 5 MG PO TABS
ORAL_TABLET | ORAL | Status: AC
  Filled 2024-07-04: qty 1

## 2024-07-04 MED ORDER — SODIUM CHLORIDE (PF) 0.9 % IJ SOLN
INTRAMUSCULAR | Status: DC | PRN
  Administered 2024-07-04: 100 mL

## 2024-07-04 MED ORDER — FENTANYL CITRATE (PF) 100 MCG/2ML IJ SOLN
INTRAMUSCULAR | Status: AC
Start: 2024-07-04 — End: 2024-07-04
  Filled 2024-07-04: qty 2

## 2024-07-04 MED ORDER — ROCURONIUM 10MG/ML (10ML) SYRINGE FOR MEDFUSION PUMP - OPTIME
INTRAVENOUS | Status: DC | PRN
  Administered 2024-07-04: 50 mg via INTRAVENOUS

## 2024-07-04 MED ORDER — LACTATED RINGERS IV SOLN: INTRAVENOUS | Status: DC | PRN

## 2024-07-04 MED ORDER — ONDANSETRON HCL 4 MG/2ML IJ SOLN
INTRAMUSCULAR | Status: AC
Start: 2024-07-04 — End: 2024-07-04
  Filled 2024-07-04: qty 2

## 2024-07-04 MED ORDER — LIDOCAINE 2% (20 MG/ML) 5 ML SYRINGE
INTRAMUSCULAR | Status: DC | PRN
  Administered 2024-07-04: 100 mg via INTRAVENOUS

## 2024-07-04 MED ORDER — 0.9 % SODIUM CHLORIDE (POUR BTL) OPTIME
TOPICAL | Status: DC | PRN
  Administered 2024-07-04: 1000 mL

## 2024-07-04 MED ORDER — PROPOFOL 500 MG/50ML IV EMUL
INTRAVENOUS | Status: DC | PRN
  Administered 2024-07-04: 125 ug/kg/min via INTRAVENOUS

## 2024-07-04 MED ORDER — PROPOFOL 500 MG/50ML IV EMUL
INTRAVENOUS | Status: AC
  Filled 2024-07-04: qty 50

## 2024-07-04 MED ORDER — SUGAMMADEX SODIUM 200 MG/2ML IV SOLN
INTRAVENOUS | Status: DC | PRN
  Administered 2024-07-04: 200 mg via INTRAVENOUS

## 2024-07-04 MED ORDER — CEFAZOLIN SODIUM-DEXTROSE 2-4 GM/100ML-% IV SOLN
INTRAVENOUS | Status: AC
  Filled 2024-07-04: qty 100

## 2024-07-04 MED ORDER — BUPIVACAINE LIPOSOME 1.3 % IJ SUSP
INTRAMUSCULAR | Status: AC
  Filled 2024-07-04: qty 20

## 2024-07-04 MED ORDER — ROCURONIUM BROMIDE 10 MG/ML (PF) SYRINGE
PREFILLED_SYRINGE | INTRAVENOUS | Status: AC
  Filled 2024-07-04: qty 10

## 2024-07-04 MED ORDER — GLYCOPYRROLATE PF 0.2 MG/ML IJ SOSY
PREFILLED_SYRINGE | INTRAMUSCULAR | Status: AC
  Filled 2024-07-04: qty 1

## 2024-07-04 MED ORDER — OXYCODONE HCL 5 MG/5ML PO SOLN: 5.0000 mg | Freq: Once | ORAL | Status: AC | PRN

## 2024-07-04 MED ORDER — GLYCOPYRROLATE PF 0.2 MG/ML IJ SOSY
PREFILLED_SYRINGE | INTRAMUSCULAR | Status: DC | PRN
  Administered 2024-07-04: .2 mg via INTRAVENOUS

## 2024-07-04 MED ORDER — ONDANSETRON HCL 4 MG/2ML IJ SOLN
4.0000 mg | Freq: Once | INTRAMUSCULAR | Status: AC | PRN
  Administered 2024-07-04: 4 mg via INTRAVENOUS

## 2024-07-04 MED ORDER — FENTANYL CITRATE (PF) 100 MCG/2ML IJ SOLN
INTRAMUSCULAR | Status: AC
  Filled 2024-07-04: qty 2

## 2024-07-04 MED ORDER — HYDROMORPHONE HCL 1 MG/ML IJ SOLN
INTRAMUSCULAR | Status: AC
  Filled 2024-07-04: qty 0.5

## 2024-07-04 MED ORDER — ACETAMINOPHEN 500 MG PO TABS
ORAL_TABLET | ORAL | Status: AC
  Filled 2024-07-04: qty 2

## 2024-07-04 MED ORDER — FENTANYL CITRATE (PF) 100 MCG/2ML IJ SOLN
INTRAMUSCULAR | Status: DC | PRN
  Administered 2024-07-04: 100 ug via INTRAVENOUS

## 2024-07-04 MED ORDER — PHENYLEPHRINE 80 MCG/ML (10ML) SYRINGE FOR IV PUSH (FOR BLOOD PRESSURE SUPPORT)
PREFILLED_SYRINGE | INTRAVENOUS | Status: DC | PRN
  Administered 2024-07-04 (×3): 160 ug via INTRAVENOUS
  Administered 2024-07-04: 80 ug via INTRAVENOUS

## 2024-07-04 MED ORDER — HYDROMORPHONE HCL 1 MG/ML IJ SOLN
INTRAMUSCULAR | Status: DC | PRN
  Administered 2024-07-04: .5 mg via INTRAVENOUS

## 2024-07-04 MED ORDER — ONDANSETRON HCL 4 MG/2ML IJ SOLN
INTRAMUSCULAR | Status: DC | PRN
  Administered 2024-07-04 (×2): 4 mg via INTRAVENOUS

## 2024-07-04 MED ORDER — OXYCODONE HCL 5 MG PO TABS
5.0000 mg | ORAL_TABLET | Freq: Once | ORAL | Status: AC | PRN
  Administered 2024-07-04: 5 mg via ORAL

## 2024-07-04 MED ORDER — ACETAMINOPHEN 500 MG PO TABS
1000.0000 mg | ORAL_TABLET | Freq: Once | ORAL | Status: AC
  Administered 2024-07-04: 1000 mg via ORAL

## 2024-07-04 SURGICAL SUPPLY — 54 items
BINDER ABDOMINAL 12 ML 46-62 (SOFTGOODS) IMPLANT
BINDER ABDOMINAL 12 SM 30-45 (SOFTGOODS) IMPLANT
BIOPATCH RED 1 DISK 7.0 (GAUZE/BANDAGES/DRESSINGS) ×2 IMPLANT
BLADE CLIPPER SURG (BLADE) IMPLANT
BLADE SURG 10 STRL SS (BLADE) ×4 IMPLANT
BLADE SURG 11 STRL SS (BLADE) IMPLANT
BLADE SURG 15 STRL LF DISP TIS (BLADE) ×1 IMPLANT
CLIP APPLIE 9.375 MED OPEN (MISCELLANEOUS) IMPLANT
DERMABOND ADVANCED .7 DNX12 (GAUZE/BANDAGES/DRESSINGS) ×2 IMPLANT
DRAIN CHANNEL 19F RND (DRAIN) ×2 IMPLANT
DRAPE UTILITY XL STRL (DRAPES) ×1 IMPLANT
DRSG TEGADERM 4X4.75 (GAUZE/BANDAGES/DRESSINGS) ×2 IMPLANT
ELECT COATED BLADE 2.86 ST (ELECTRODE) IMPLANT
ELECTRODE BLDE 4.0 EZ CLN MEGD (MISCELLANEOUS) ×1 IMPLANT
ELECTRODE REM PT RTRN 9FT ADLT (ELECTROSURGICAL) ×2 IMPLANT
EVACUATOR SILICONE 100CC (DRAIN) ×2 IMPLANT
GAUZE PAD ABD 8X10 STRL (GAUZE/BANDAGES/DRESSINGS) ×2 IMPLANT
GAUZE SPONGE 2X2 STRL 8-PLY (GAUZE/BANDAGES/DRESSINGS) ×2 IMPLANT
GLOVE BIO SURGEON STRL SZ 6.5 (GLOVE) IMPLANT
GLOVE BIO SURGEON STRL SZ7.5 (GLOVE) ×1 IMPLANT
GLOVE BIO SURGEON STRL SZ8 (GLOVE) ×2 IMPLANT
GLOVE BIOGEL PI IND STRL 7.0 (GLOVE) IMPLANT
GLOVE BIOGEL PI IND STRL 8 (GLOVE) ×2 IMPLANT
GOWN STRL REUS W/ TWL LRG LVL3 (GOWN DISPOSABLE) ×1 IMPLANT
GOWN STRL REUS W/TWL XL LVL3 (GOWN DISPOSABLE) ×2 IMPLANT
HEMOSTAT ARISTA ABSORB 3G PWDR (HEMOSTASIS) IMPLANT
HIBICLENS CHG 4% 4OZ BTL (MISCELLANEOUS) ×1 IMPLANT
MANIFOLD NEPTUNE II (INSTRUMENTS) ×1 IMPLANT
NDL HYPO 22X1.5 SAFETY MO (MISCELLANEOUS) ×2 IMPLANT
NEEDLE HYPO 22X1.5 SAFETY MO (MISCELLANEOUS) ×2 IMPLANT
NS IRRIG 1000ML POUR BTL (IV SOLUTION) ×1 IMPLANT
PACK BASIN DAY SURGERY FS (CUSTOM PROCEDURE TRAY) ×1 IMPLANT
PACK UNIVERSAL I (CUSTOM PROCEDURE TRAY) ×1 IMPLANT
PENCIL SMOKE EVACUATOR (MISCELLANEOUS) ×2 IMPLANT
PIN SAFETY STERILE (MISCELLANEOUS) ×1 IMPLANT
SLEEVE SCD COMPRESS KNEE MED (STOCKING) ×1 IMPLANT
SPONGE T-LAP 18X18 ~~LOC~~+RFID (SPONGE) ×2 IMPLANT
STAPLER INSORB 30 2030 C-SECTI (MISCELLANEOUS) IMPLANT
STAPLER SKIN PROX WIDE 3.9 (STAPLE) ×1 IMPLANT
SUT MNCRL AB 3-0 PS2 27 (SUTURE) ×4 IMPLANT
SUT MNCRL AB 4-0 PS2 18 (SUTURE) ×4 IMPLANT
SUT SILK 2 0 PERMA HAND 18 BK (SUTURE) ×2 IMPLANT
SUT VIC AB 2-0 CT1 TAPERPNT 27 (SUTURE) ×4 IMPLANT
SUT VIC AB 3-0 PS1 18XBRD (SUTURE) IMPLANT
SUT VIC AB 3-0 SH 27X BRD (SUTURE) ×1 IMPLANT
SUT VICRYL AB 3 0 TIES (SUTURE) IMPLANT
SYR 20ML LL LF (SYRINGE) ×2 IMPLANT
SYR BULB IRRIG 60ML STRL (SYRINGE) ×1 IMPLANT
SYR CONTROL 10ML LL (SYRINGE) ×1 IMPLANT
TOWEL GREEN STERILE FF (TOWEL DISPOSABLE) ×2 IMPLANT
TRAY DSU PREP LF (CUSTOM PROCEDURE TRAY) ×1 IMPLANT
TUBE CONNECTING 20X1/4 (TUBING) ×1 IMPLANT
UNDERPAD 30X36 HEAVY ABSORB (UNDERPADS AND DIAPERS) ×2 IMPLANT
YANKAUER SUCT BULB TIP NO VENT (SUCTIONS) ×1 IMPLANT

## 2024-07-04 NOTE — Anesthesia Procedure Notes (Addendum)
 Procedure Name: Intubation Date/Time: 07/04/2024 11:00 AM  Performed by: Debarah Chiquita LABOR, CRNAPre-anesthesia Checklist: Patient identified, Emergency Drugs available, Suction available and Patient being monitored Patient Re-evaluated:Patient Re-evaluated prior to induction Oxygen Delivery Method: Circle system utilized Preoxygenation: Pre-oxygenation with 100% oxygen Induction Type: IV induction Ventilation: Mask ventilation without difficulty Laryngoscope Size: Mac and 3 Grade View: Grade I Tube type: Oral Tube size: 7.0 mm Number of attempts: 1 Airway Equipment and Method: Stylet and Bite block Placement Confirmation: ETT inserted through vocal cords under direct vision, positive ETCO2 and breath sounds checked- equal and bilateral Secured at: 21 cm Tube secured with: Tape Dental Injury: Teeth and Oropharynx as per pre-operative assessment

## 2024-07-04 NOTE — Anesthesia Postprocedure Evaluation (Signed)
 Anesthesia Post Note  Patient: Melissa Santiago  Procedure(s) Performed: PANNICULECTOMY (Abdomen)     Patient location during evaluation: PACU Anesthesia Type: General Level of consciousness: awake and alert Pain management: pain level controlled Vital Signs Assessment: post-procedure vital signs reviewed and stable Respiratory status: spontaneous breathing, nonlabored ventilation, respiratory function stable and patient connected to nasal cannula oxygen Cardiovascular status: blood pressure returned to baseline and stable Postop Assessment: no apparent nausea or vomiting Anesthetic complications: no   No notable events documented.  Last Vitals:  Vitals:   07/04/24 1345 07/04/24 1400  BP: 98/72 91/60  Pulse: 79 81  Resp: 15 15  Temp:    SpO2: 94% 93%    Last Pain:  Vitals:   07/04/24 1343  TempSrc:   PainSc: 8                  Rome Ade

## 2024-07-04 NOTE — Op Note (Signed)
 DATE OF OPERATION: 07/04/2024  LOCATION: [facility] operating Room  PREOPERATIVE DIAGNOSIS: Panniculitis  POSTOPERATIVE DIAGNOSIS: Same  PROCEDURE: Panniculectomy  SURGEON: Marinell Birmingham, MD  ASSISTANT: Estefana Peck  EBL: 100 cc  CONDITION: Stable  COMPLICATIONS: None  INDICATION: The patient, Melissa Santiago, is a 58 y.o. female born on 23-May-1966, is here for treatment of ongoing rash on the posterior aspect of the pannus.   PROCEDURE DETAILS:  The patient was seen prior to surgery and marked.  The IV antibiotics were given. The patient was taken to the operating room and given a general anesthetic. A standard time out was performed and all information was confirmed by those in the room. SCDs were placed.   The abdomen is prepped draped in usual sterile manner a transverse incision was made across the lower portion of the abdomen and electrocautery was used to dissect the skin and fat down to the anterior abdominal wall fascia.  The skin and fat was then elevated from the fascia in a caudad to cranial manner to the level of the umbilicus.  An appropriate point for resection of the pannus was identified and a counterincision made.  The skin and fat was removed without difficulty.  Meticulous hemostasis was achieved with the electrocautery.  The subfascial space over the rectus muscles and the external bleak in the subcutaneous tissues were infiltrated with 100 mL of a mixture of Exparel , quarter percent plain Marcaine , 50 mL of injectable saline.  219 French round drains were placed in the surgical bed and brought out through separate stab incisions.  The surfaces were then coated with Arista hemostatic agent.  The skin edges were tailor tacked in place with skin clips.  Scarpa's fascia was approximated with interrupted 2-0 Vicryl sutures.  The dermis was closed with interrupted and running 3-0 Monocryl sutures and skin was closed with running 4 Monocryl subcuticular stitch.  The  incisions were sealed with Dermabond.  Patient was placed in a compressive garment awakened from anesthesia without incident.  She was transferred to the recovery room in good stable condition.  All instrument needle and sponge counts were reported as correct and no complications were appreciated during the procedure. The patient was allowed to wake up and taken to recovery room in stable condition at the end of the case. The family was notified at the end of the case.   The advanced practice practitioner (APP) assisted throughout the case.  The APP was essential in retraction and counter traction when needed to make the case progress smoothly.  This retraction and assistance made it possible to see the tissue plans for the procedure.  The assistance was needed for blood control, tissue re-approximation and assisted with closure of the incision site.

## 2024-07-04 NOTE — Discharge Instructions (Addendum)
 INSTRUCTIONS FOR AFTER ABDOMINAL SURGERY   You will likely have some questions about what to expect following your operation.  The following information will help you and your family understand what to expect when you are discharged from the hospital.  It is important to follow these guidelines to help ensure a smooth recovery and reduce complication.  Postoperative instructions include information on: diet, wound care, medications and physical activity.  AFTER SURGERY Expect to go home after the procedure.  In some cases, you may need to spend one night in the hospital for observation.  DIET Abdominal surgery does not require a specific diet.  However, the healthier you eat the better your body will heal. It is important to increasing your protein intake.  This means limiting the foods with sugar and carbohydrates.  Focus on vegetables and some meat.  If you have liposuction during your procedure be sure to drink water.  If your urine is bright yellow, then it is concentrated, and you need to drink more water.  As a general rule after surgery, you should have 8 ounces of water every hour while awake.  If you find you are persistently nauseated or unable to take in liquids let us  know.  NO TOBACCO USE or EXPOSURE.  This will slow your healing process and lead to a wound.  WOUND CARE Leave the binder on at all times except when showering . Use fragrance free soap like Dial, Dove or [place].   After 24 hours you can remove the binder to shower. Once dry apply binder or compressive spanx. No baths, pools or hot tubs for four weeks. We close your incision to leave the smallest and best-looking scar. No ointment or creams on your incisions for four weeks.  No Neosporin (Too many skin reactions).  A few weeks after surgery you can use Mederma and start massaging the scar. We ask you to wear your binder or compressive spanx for the first 6 weeks around the clock, including while sleeping. This provides added  comfort and helps reduce the fluid accumulation at the surgery site. NO Ice or heating pads to the operative site.  You have a very high risk of a BURN before you feel the temperature change. Continue to empty, recharge, & record drainage from drains 2-3 times a day, as needed.  ACTIVITY No heavy lifting until cleared by the doctor.  This usually means no more than a half-gallon of milk.  It is OK to walk and climb stairs. Moving your legs is very important to decrease your risk of a blood clot.  It will also help keep you from getting deconditioned.  Every 1 to 2 hours get up and walk for 5 minutes. This will help with a quicker recovery back to normal.  Let pain be your guide so you don't do too much.  This time is for you to recover.  You will be more comfortable if you sleep and rest with your head elevated either with a few pillows under you or in a recliner.  No stomach sleeping for a three months.  WORK Everyone returns to work at different times. As a rough guide, most people take at least 1 - 2 weeks off prior to returning to work. If you need documentation for your job, give the forms to the front staff at the clinic.  DRIVING Arrange for someone to bring you home from the hospital after your surgery.  You may be able to drive a few days after  surgery but not while taking any narcotics or valium.  BOWEL MOVEMENTS Constipation can occur after anesthesia and while taking pain medication.  It is important to stay ahead for your comfort.  We recommend taking Milk of Magnesia (2 tablespoons; twice a day) while taking the pain pills.  MEDICATIONS You may be prescribed should start after surgery At your preoperative visit for you history and physical you may have been given the following medications: Zofran  4 mg:  This is to treat nausea and vomiting.  You can take this every 6 hours as needed and only if needed. 2.   Oxycodone  5 mg:  This is only to be used after you have taken the Motrin or  the Tylenol . Every 8 hours as needed.   Over the counter Medication to take: Ibuprofen (Motrin) 600 mg:  Take this every 6 hours.  If you have additional pain then take 500 mg of the Tylenol  every 8 hours.  No Tylenol  until after 3:30pm today, if needed. Only take the Norco after you have tried these two. MiraLAX or Milk of Magnesia: Take this according to the bottle if you take the Norco.  WHEN TO CALL Call your surgeon's office if any of the following occur: Fever 101 degrees F or greater Excessive bleeding or fluid from the incision site. Pain that increases over time without aid from the medications Redness, warmth, or pus draining from incision sites Persistent nausea or inability to take in liquids Severe misshapen area that underwent the operation.   Post Anesthesia Home Care Instructions  Activity: Get plenty of rest for the remainder of the day. A responsible individual must stay with you for 24 hours following the procedure.  For the next 24 hours, DO NOT: -Drive a car -Advertising copywriter -Drink alcoholic beverages -Take any medication unless instructed by your physician -Make any legal decisions or sign important papers.  Meals: Start with liquid foods such as gelatin or soup. Progress to regular foods as tolerated. Avoid greasy, spicy, heavy foods. If nausea and/or vomiting occur, drink only clear liquids until the nausea and/or vomiting subsides. Call your physician if vomiting continues.  Special Instructions/Symptoms: Your throat may feel dry or sore from the anesthesia or the breathing tube placed in your throat during surgery. If this causes discomfort, gargle with warm salt water. The discomfort should disappear within 24 hours.  If you had a scopolamine patch placed behind your ear for the management of post- operative nausea and/or vomiting:  1. The medication in the patch is effective for 72 hours, after which it should be removed.  Wrap patch in a tissue and  discard in the trash. Wash hands thoroughly with soap and water. 2. You may remove the patch earlier than 72 hours if you experience unpleasant side effects which may include dry mouth, dizziness or visual disturbances. 3. Avoid touching the patch. Wash your hands with soap and water after contact with the patch.   Information for Discharge Teaching: EXPAREL  (bupivacaine  liposome injectable suspension)   Pain relief is important to your recovery. The goal is to control your pain so you can move easier and return to your normal activities as soon as possible after your procedure. Your physician may use several types of medicines to manage pain, swelling, and more.  Your surgeon or anesthesiologist gave you EXPAREL (bupivacaine ) to help control your pain after surgery.  EXPAREL  is a local anesthetic designed to release slowly over an extended period of time to provide pain relief by numbing  the tissue around the surgical site. EXPAREL  is designed to release pain medication over time and can control pain for up to 72 hours. Depending on how you respond to EXPAREL , you may require less pain medication during your recovery. EXPAREL  can help reduce or eliminate the need for opioids during the first few days after surgery when pain relief is needed the most. EXPAREL  is not an opioid and is not addictive. It does not cause sleepiness or sedation.   Important! A teal colored band has been placed on your arm with the date, time and amount of EXPAREL  you have received. Please leave this armband in place for the full 96 hours following administration, and then you may remove the band. If you return to the hospital for any reason within 96 hours following the administration of EXPAREL , the armband provides important information that your health care providers to know, and alerts them that you have received this anesthetic.    Possible side effects of EXPAREL : Temporary loss of sensation or ability to move in  the area where medication was injected. Nausea, vomiting, constipation Rarely, numbness and tingling in your mouth or lips, lightheadedness, or anxiety may occur. Call your doctor right away if you think you may be experiencing any of these sensations, or if you have other questions regarding possible side effects.  Follow all other discharge instructions given to you by your surgeon or nurse. Eat a healthy diet and drink plenty of water or other fluids.About my Jackson-Pratt Bulb Drain  What is a Jackson-Pratt bulb? A Jackson-Pratt is a soft, round device used to collect drainage. It is connected to a long, thin drainage catheter, which is held in place by one or two small stiches near your surgical incision site. When the bulb is squeezed, it forms a vacuum, forcing the drainage to empty into the bulb.  Emptying the Jackson-Pratt bulb- To empty the bulb: 1. Release the plug on the top of the bulb. 2. Pour the bulb's contents into a measuring container which your nurse will provide. 3. Record the time emptied and amount of drainage. Empty the drain(s) as often as your     doctor or nurse recommends.  Date                  Time                    Amount (Drain 1)                 Amount (Drain 2)  _____________________________________________________________________  _____________________________________________________________________  _____________________________________________________________________  _____________________________________________________________________  _____________________________________________________________________  _____________________________________________________________________  _____________________________________________________________________  _____________________________________________________________________  Squeezing the Jackson-Pratt Bulb- To squeeze the bulb: 1. Make sure the plug at the top of the bulb is open. 2. Squeeze the bulb  tightly in your fist. You will hear air squeezing from the bulb. 3. Replace the plug while the bulb is squeezed. 4. Use a safety pin to attach the bulb to your clothing. This will keep the catheter from     pulling at the bulb insertion site.  When to call your doctor- Call your doctor if: Drain site becomes red, swollen or hot. You have a fever greater than 101 degrees F. There is oozing at the drain site. Drain falls out (apply a guaze bandage over the drain hole and secure it with tape). Drainage increases daily not related to activity patterns. (You will usually have more drainage when you are active than when you are resting.)  Drainage has a bad odor.

## 2024-07-04 NOTE — Interval H&P Note (Signed)
 History and Physical Interval Note:  No change in exam or indication for surgery All questions answered Marked for a panniculectomy with her concurrence Will proceed at her request   07/04/2024 10:39 AM  Melissa Santiago  has presented today for surgery, with the diagnosis of m79.3.  The various methods of treatment have been discussed with the patient and family. After consideration of risks, benefits and other options for treatment, the patient has consented to  Procedure(s): PANNICULECTOMY (N/A) as a surgical intervention.  The patient's history has been reviewed, patient examined, no change in status, stable for surgery.  I have reviewed the patient's chart and labs.  Questions were answered to the patient's satisfaction.     Leonce KATHEE Birmingham

## 2024-07-04 NOTE — Transfer of Care (Signed)
 Immediate Anesthesia Transfer of Care Note  Patient: Melissa Santiago  Procedure(s) Performed: PANNICULECTOMY (Abdomen)  Patient Location: PACU  Anesthesia Type:General  Level of Consciousness: awake, alert , oriented, and patient cooperative  Airway & Oxygen Therapy: Patient Spontanous Breathing and Patient connected to face mask oxygen  Post-op Assessment: Report given to RN and Post -op Vital signs reviewed and stable  Post vital signs: Reviewed and stable  Last Vitals:  Vitals Value Taken Time  BP 100/54 07/04/24 13:37  Temp    Pulse 80 07/04/24 13:40  Resp 20 07/04/24 13:40  SpO2 100 % 07/04/24 13:40  Vitals shown include unfiled device data.  Last Pain:  Vitals:   07/04/24 0926  TempSrc: Tympanic  PainSc: 0-No pain      Patients Stated Pain Goal: 7 (07/04/24 0926)  Complications: No notable events documented.

## 2024-07-04 NOTE — Anesthesia Preprocedure Evaluation (Signed)
 Anesthesia Evaluation  Patient identified by MRN, date of birth, ID band Patient awake    Reviewed: Allergy & Precautions, NPO status , Patient's Chart, lab work & pertinent test results  History of Anesthesia Complications Negative for: history of anesthetic complications  Airway Mallampati: II  TM Distance: >3 FB Neck ROM: Full    Dental no notable dental hx. (+) Teeth Intact   Pulmonary neg pulmonary ROS, neg sleep apnea, neg COPD, Patient abstained from smoking.Not current smoker, former smoker   Pulmonary exam normal breath sounds clear to auscultation       Cardiovascular Exercise Tolerance: Good METS(-) hypertension(-) CAD and (-) Past MI negative cardio ROS (-) dysrhythmias  Rhythm:Regular Rate:Normal - Systolic murmurs    Neuro/Psych  PSYCHIATRIC DISORDERS Anxiety Depression    negative neurological ROS     GI/Hepatic ,GERD  Controlled,,(+)     (-) substance abuse  S/p surgery to correct GERD, says GERD is basically non existent now   Endo/Other  neg diabetes    Renal/GU negative Renal ROS     Musculoskeletal   Abdominal   Peds  Hematology  (+) Blood dyscrasia Hyperhomocysteinemia and protein S deficiency, hypercoagulable state (never had thromboembolic events herself however). Per hematologist recommendation, she has  been on xarelto  preoperatively.   Anesthesia Other Findings Past Medical History: 06/29/2013: Acute lumbar radiculopathy 05/24/2018: Chest pain in adult 05/24/2018: Chronic constipation 05/24/2018: Gastroesophageal reflux disease No date: GERD (gastroesophageal reflux disease) 05/10/2013: Left sided sciatica 06/29/2013: Lumbar disc herniation 05/24/2018: Morbid obesity (HCC) No date: Obesity 03/23/2015: Osteopenia 06/30/2013: S/P bariatric surgery  Reproductive/Obstetrics                              Anesthesia Physical Anesthesia Plan  ASA:  2  Anesthesia Plan: General   Post-op Pain Management: Tylenol  PO (pre-op)*   Induction: Intravenous  PONV Risk Score and Plan: 3 and Ondansetron , Dexamethasone  and Midazolam   Airway Management Planned: Oral ETT  Additional Equipment: None  Intra-op Plan:   Post-operative Plan: Extubation in OR  Informed Consent: I have reviewed the patients History and Physical, chart, labs and discussed the procedure including the risks, benefits and alternatives for the proposed anesthesia with the patient or authorized representative who has indicated his/her understanding and acceptance.     Dental advisory given  Plan Discussed with: CRNA and Surgeon  Anesthesia Plan Comments: (Discussed risks of anesthesia with patient, including PONV, sore throat, lip/dental/eye damage. Rare risks discussed as well, such as cardiorespiratory and neurological sequelae, and allergic reactions. Discussed the role of CRNA in patient's perioperative care. Patient understands.)         Anesthesia Quick Evaluation

## 2024-07-05 ENCOUNTER — Encounter (HOSPITAL_BASED_OUTPATIENT_CLINIC_OR_DEPARTMENT_OTHER): Payer: Self-pay | Admitting: Plastic Surgery

## 2024-07-11 ENCOUNTER — Encounter: Payer: Self-pay | Admitting: Plastic Surgery

## 2024-07-11 ENCOUNTER — Ambulatory Visit (INDEPENDENT_AMBULATORY_CARE_PROVIDER_SITE_OTHER): Admitting: Plastic Surgery

## 2024-07-11 VITALS — BP 99/63 | HR 84 | Ht 64.0 in | Wt 195.6 lb

## 2024-07-11 DIAGNOSIS — M793 Panniculitis, unspecified: Secondary | ICD-10-CM

## 2024-07-11 DIAGNOSIS — Z9889 Other specified postprocedural states: Secondary | ICD-10-CM

## 2024-07-11 NOTE — Progress Notes (Signed)
 Ms. Melissa Santiago returns today approximately 1 week postop from a panniculectomy.  She is very happy and states that she had virtually no pain postoperatively.  She is happy with the early results.  She has been ambulating without difficulty since surgery.  She is tolerating a diet.  Her right drain has put out virtually nothing over the past 2 days.  It is removed without difficulty.  The left drain is still putting out greater than 30 mL/day.  We discussed the volume at which the drain will be removed.  This is less than 30 mL over 2 consecutive days.  She may call if she reaches this point before her appointment next week.  Continue ambulation.  Continue compression.  Call for any questions or concerns.

## 2024-07-25 ENCOUNTER — Ambulatory Visit: Admitting: Student

## 2024-07-25 VITALS — BP 100/68 | HR 88 | Temp 97.6°F | Ht 64.0 in | Wt 192.6 lb

## 2024-07-25 DIAGNOSIS — Z9889 Other specified postprocedural states: Secondary | ICD-10-CM

## 2024-07-25 NOTE — Progress Notes (Signed)
 Patient is a 58 year old female who underwent panniculectomy with Dr. Waddell on 07/04/2024.  Patient is 3 weeks postop.  She presents to the clinic today for postoperative follow-up.  Patient was last seen in the clinic on 07/11/2024.  At this visit, patient was very happy and had virtually no pain postoperatively.  Patient was happy with the early results.  Patient reported that she had been ambulating without difficulty since surgery.  Her right drain had put out virtually nothing and it was removed without any difficulty.  Today, patient reports she is doing really well.  She states that she is very happy with her results.  Patient states that about 2 days ago, her drain fell out on her left side.  She states that at that time, it was putting out less than 30 cc/day.  She otherwise denies any new issues or concerns.  She denies any fevers or chills.  She denies any pain.    Patient also states that she would be interested in a breast reduction at some point.  Chaperone present on exam.  On exam, patient is sitting upright in no acute distress.  Abdomen is overall soft and nontender.  There is no overlying erythema.  There are no fluid collections on exam.  Some of the skin does appear to be irritated, does look like a contact dermatitis.  The incision overall appears to be healing well, there are a few scattered  superficial wounds.  There is no drainage on exam.  Drain site is noted to have a little bit of exudate in the wound.  There are no signs of infection on exam.  Recommended that patient apply Vaseline to her incisions and to the drain site and to the areas of irritation daily.  I discussed with her that her wounds may get a little bit worse before they get better, but to continue to monitor them.  I discussed with her that if she has any concerns about her wounds, she may call our clinic.  She expressed understanding.  Patient states that the irritation is most likely from her binder.  I  discussed with the patient that she may transition to spanks if she would like at this time.  Discussed with her that she may apply ABD pad or gauze over the irritation.  Patient expressed understanding.  I also discussed with the patient that she will need to be completely recovered from the surgery prior to moving forward with a breast reduction.  Recommend that she have a consult with Dr. Waddell at least 1 month from now.  She expressed understanding was in agreement.  Patient to follow back up in a few weeks.  I instructed her to call if she has any questions or concerns about anything.

## 2024-08-02 ENCOUNTER — Ambulatory Visit (INDEPENDENT_AMBULATORY_CARE_PROVIDER_SITE_OTHER): Admitting: Family

## 2024-08-02 VITALS — BP 105/71 | HR 96 | Temp 98.3°F | Resp 16 | Ht 64.0 in | Wt 191.0 lb

## 2024-08-02 DIAGNOSIS — L304 Erythema intertrigo: Secondary | ICD-10-CM

## 2024-08-02 DIAGNOSIS — R0683 Snoring: Secondary | ICD-10-CM | POA: Diagnosis not present

## 2024-08-02 DIAGNOSIS — G4700 Insomnia, unspecified: Secondary | ICD-10-CM | POA: Diagnosis not present

## 2024-08-02 DIAGNOSIS — F1011 Alcohol abuse, in remission: Secondary | ICD-10-CM

## 2024-08-02 DIAGNOSIS — Z1231 Encounter for screening mammogram for malignant neoplasm of breast: Secondary | ICD-10-CM | POA: Diagnosis not present

## 2024-08-02 NOTE — Progress Notes (Signed)
 Subjective:     Patient ID: [name], female    DOB: [birth year], 58 y.o.   MRN: [id number]  Chief Complaint  Patient presents with   Insomnia    Having trouble falling and staying sleep    Insomnia    Discussed the use of AI scribe software for clinical note transcription with the patient, who gave verbal consent to proceed.  History of Present Illness  Melissa Santiago is a 58 year old female who presents with sleep disturbances and snoring.  She experiences difficulty falling asleep and staying asleep for several months, often lying awake despite exhaustion. Her husband has observed new onset snoring. She has not had a sleep study. She is at her lowest weight in years but continues to have sleep issues. Melatonin caused discomfort, and she occasionally uses hydroxyzine  for itching, not specifically for sleep. She underwent a panniculectomy a month ago, resulting in a significant reduction in clothing size and alleviation of rashes. She experiences stress and muscle aches in the upper body. She has been sober from alcohol for a long time and no longer uses it to aid sleep.     Health Maintenance Due  Topic Date Due   HIV Screening  Never done   Hepatitis C Screening  Never done   Hepatitis B Vaccines 19-59 Average Risk (1 of 3 - 19+ 3-dose series) Never done   Pneumococcal Vaccine: 50+ Years (1 of 1 - PCV) Never done   Zoster Vaccines- Shingrix (1 of 2) Never done   DTaP/Tdap/Td (2 - Td or Tdap) 06/23/2022   Mammogram  12/04/2022   Cervical Cancer Screening (HPV/Pap Cotest)  12/15/2023   COVID-19 Vaccine (5 - 2025-26 season) 06/27/2024    Past Medical History:  Diagnosis Date   Acute lumbar radiculopathy 06/29/2013   Chest pain in adult 05/24/2018   Chronic constipation 05/24/2018   Gastroesophageal reflux disease 05/24/2018   GERD (gastroesophageal reflux disease)    Left sided sciatica 05/10/2013   Lumbar disc herniation 06/29/2013   Morbid obesity (HCC)  05/24/2018   Obesity    Osteopenia 03/23/2015   S/P bariatric surgery 06/30/2013    Past Surgical History:  Procedure Laterality Date   BREAST BIOPSY Left 2014   CHOLECYSTECTOMY  2022   LAPAROSCOPIC GASTRIC SLEEVE RESECTION  2014   LUMBAR DISC SURGERY  2014/2016   PANNICULECTOMY N/A 07/04/2024   Procedure: PANNICULECTOMY;  Surgeon: Waddell Leonce NOVAK, MD;  Location: [facility];  Service: Plastics;  Laterality: N/A;    Family History  Problem Relation Age of Onset   Arthritis Mother    Early death Mother    Mental illness Mother    Alcohol abuse Father    Arthritis Father    Early death Sister    Thyroid  cancer Sister    Hypertension Sister    Heart attack Maternal Grandfather    Uterine cancer Sister    Hypertension Sister    Cervical cancer Sister    Depression Sister    Hypertension Sister    Colon cancer Neg Hx    Esophageal cancer Neg Hx    Breast cancer Neg Hx    Rectal cancer Neg Hx    Stomach cancer Neg Hx     Social History   Socioeconomic History   Marital status: Married    Spouse name: Franky   Number of children: 2   Years of education: Not on file   Highest education level: Bachelor's degree (e.g., BA, AB,  BS)  Occupational History   Occupation: Nature conservation officer  Tobacco Use   Smoking status: Former    Current packs/day: 0.00    Average packs/day: 0.5 packs/day for 15.0 years (7.5 ttl pk-yrs)    Types: Cigarettes    Start date: 32    Quit date: 2006    Years since quitting: 19.7   Smokeless tobacco: Never  Vaping Use   Vaping status: Never Used  Substance and Sexual Activity   Alcohol use: Not Currently   Drug use: Never   Sexual activity: Yes    Partners: Male    Birth control/protection: I.U.D.  Other Topics Concern   Not on file  Social History Narrative   From [place] , Divorced since 2015   Works as a Research officer, political party for [employer]   Remarried   2 daughters- one in [employer] [place]- lives in [place] , one in [place] - works  as a Runner, broadcasting/film/video   Enjoys spending time with her husband   Has a dog and will adopt another soon.    Social Drivers of Corporate investment banker Strain: Low Risk  (03/04/2024)   Overall Financial Resource Strain (CARDIA)    Difficulty of Paying Living Expenses: Not hard at all  Food Insecurity: No Food Insecurity (03/22/2024)   Hunger Vital Sign    Worried About Running Out of Food in the Last Year: Never true    Ran Out of Food in the Last Year: Never true  Transportation Needs: No Transportation Needs (03/22/2024)   PRAPARE - Administrator, Civil Service (Medical): No    Lack of Transportation (Non-Medical): No  Physical Activity: Inactive (03/04/2024)   Exercise Vital Sign    Days of Exercise per Week: 1 day    Minutes of Exercise per Session: 0 min  Stress: Stress Concern Present (03/04/2024)   [employer] of Occupational Health - Occupational Stress Questionnaire    Feeling of Stress : Rather much  Social Connections: Socially Integrated (03/04/2024)   Social Connection and Isolation Panel    Frequency of Communication with Friends and Family: More than three times a week    Frequency of Social Gatherings with Friends and Family: Once a week    Attends Religious Services: More than 4 times per year    Active Member of [employer] or Organizations: Yes    Attends Engineer, structural: More than 4 times per year    Marital Status: Married  Catering manager Violence: Not At Risk (03/22/2024)   Humiliation, Afraid, Rape, and Kick questionnaire    Fear of Current or Ex-Partner: No    Emotionally Abused: No    Physically Abused: No    Sexually Abused: No    Outpatient Medications Prior to Visit  Medication Sig Dispense Refill   buPROPion  (WELLBUTRIN  XL) 150 MG 24 hr tablet TAKE 1 TABLET BY MOUTH EVERY DAY 90 tablet 1   cyanocobalamin  (VITAMIN B12) 1000 MCG/ML injection INJECT 1ML INTO MUSCLE ONCE WEEKLY FOR 4 WEEKS, THEN 1 ML ONCE MONTHLY 6 mL 1   DULoxetine   (CYMBALTA ) 60 MG capsule TAKE 1 CAPSULE BY MOUTH EVERY DAY 90 capsule 1   folic acid  (FOLVITE ) 1 MG tablet Take 1 tablet (1 mg total) by mouth daily. 30 tablet 11   hydrOXYzine  (VISTARIL ) 25 MG capsule TAKE 1 CAPSULE (25 MG TOTAL) BY MOUTH EVERY 8 (EIGHT) HOURS AS NEEDED. 30 capsule 2   Menthol, Topical Analgesic, 4 % GEL Apply 1 Application topically as needed.  NEEDLE, DISP, 25 G (B-D DISP NEEDLE 25GX1) 25G X 1 MISC For B12 inj 50 each 1   nystatin  powder Apply twice daily as needed to skin fold 60 g 1   rivaroxaban  (XARELTO ) 10 MG TABS tablet Take 1 tablet (10 mg total) by mouth daily. Start taking 1 week prior to surgery. Stop 2 days before surgery and restart the day after for 1 month. 30 tablet 1   valACYclovir  (VALTREX ) 500 MG tablet TAKE 1 TABLET (500 MG TOTAL) BY MOUTH DAILY. 90 tablet 1   ondansetron  (ZOFRAN ) 4 MG tablet Take 1 tablet (4 mg total) by mouth every 8 (eight) hours as needed for up to 20 doses for nausea or vomiting. 20 tablet 0   oxyCODONE  (ROXICODONE ) 5 MG immediate release tablet Take 1 tablet (5 mg total) by mouth every 6 (six) hours as needed for up to 15 doses for severe pain (pain score 7-10). (Patient not taking: Reported on 07/25/2024) 15 tablet 0   No facility-administered medications prior to visit.    Allergies  Allergen Reactions   Tolmetin Hives, Itching and Rash    Review of Systems  Psychiatric/Behavioral:  The patient has insomnia.        Objective:    Physical Exam Constitutional:      General: She is not in acute distress.    Appearance: Normal appearance. She is well-developed.  HENT:     Head: Normocephalic and atraumatic.     Right Ear: External ear normal.     Left Ear: External ear normal.  Eyes:     General: No scleral icterus. Neck:     Thyroid : No thyromegaly.  Cardiovascular:     Rate and Rhythm: Normal rate and regular rhythm.     Heart sounds: Normal heart sounds. No murmur heard. Pulmonary:     Effort: Pulmonary  effort is normal. No respiratory distress.     Breath sounds: Normal breath sounds. No wheezing.  Musculoskeletal:     Cervical back: Neck supple.  Skin:    General: Skin is warm and dry.  Neurological:     Mental Status: She is alert and oriented to person, place, and time.  Psychiatric:        Mood and Affect: Mood normal.        Behavior: Behavior normal.        Thought Content: Thought content normal.        Judgment: Judgment normal.      BP 105/71 (BP Location: Right Arm, Patient Position: Sitting, Cuff Size: Normal)   Pulse 96   Temp 98.3 F (36.8 C) (Oral)   Resp 16   Ht 5' 4 (1.626 m)   Wt 191 lb (86.6 kg)   LMP 01/12/2019 (Approximate)   SpO2 96%   BMI 32.79 kg/m  Wt Readings from Last 3 Encounters:  08/02/24 191 lb (86.6 kg)  07/25/24 192 lb 9.6 oz (87.4 kg)  07/11/24 195 lb 9.6 oz (88.7 kg)       Assessment & Plan:   Problem List Items Addressed This Visit       Unprioritized   Snoring - Primary   Loud snoring and daytime somnolence are concerning for OSA. Will refer to Pulmonology for further evaluation/sleep study.       Relevant Orders   Ambulatory referral to Pulmonology   Intertrigo   Resolved following panniculectomy. She is pleased with the results of her surgery.       Insomnia   Having trouble  falling asleep and staying asleep.  Has hydroxyzine  on hand for prn itching. Recommended that she try taking 1 tab at bedtime prn to see if this helps.       History of alcohol abuse   Remains in remission.       Other Visit Diagnoses       Breast cancer screening by mammogram       Relevant Orders   MM 3D SCREENING MAMMOGRAM BILATERAL BREAST       I have discontinued Zanyia E. Sleeper's ondansetron  and oxyCODONE . I am also having her maintain her nystatin , Menthol (Topical Analgesic), folic acid , DULoxetine , rivaroxaban , B-D DISP NEEDLE 25GX1, hydrOXYzine , cyanocobalamin , valACYclovir , and buPROPion .  No orders of the defined types  were placed in this encounter.

## 2024-08-02 NOTE — Patient Instructions (Addendum)
 VISIT SUMMARY:  Today, we discussed your ongoing sleep disturbances and new onset snoring, which may suggest obstructive sleep apnea. We also reviewed your history of alcohol dependence, which remains in remission. Additionally, we covered some general health maintenance items, including upcoming screenings and vaccinations.  YOUR PLAN:  INSOMNIA AND SUSPECTED OBSTRUCTIVE SLEEP APNEA: You have been experiencing chronic insomnia with daytime fatigue and new onset snoring, which may indicate obstructive sleep apnea. -Take hydroxyzine  at bedtime for one week to see if it helps with sleep. -You will be referred to a pulmonary group for a consultation and a home sleep study.  ALCOHOL DEPENDENCE IN REMISSION: Your alcohol dependence remains in sustained remission with no cravings or desire to resume use. -Continue to maintain your sobriety and attend any support meetings as needed.  GENERAL HEALTH MAINTENANCE: We discussed the importance of regular screenings and vaccinations. -You are due for a mammogram. We will order this- please schedule on the first floor imaging department. -Schedule a Pap smear and physical exam in one month. -We discussed the benefits of the shingles vaccination will plan to give it to you at your upcoming physical along with your flu shot.

## 2024-08-02 NOTE — Assessment & Plan Note (Signed)
 Having trouble falling asleep and staying asleep.  Has hydroxyzine  on hand for prn itching. Recommended that she try taking 1 tab at bedtime prn to see if this helps.

## 2024-08-02 NOTE — Assessment & Plan Note (Signed)
 Remains in remission

## 2024-08-02 NOTE — Assessment & Plan Note (Signed)
 Loud snoring and daytime somnolence are concerning for OSA. Will refer to Pulmonology for further evaluation/sleep study.

## 2024-08-02 NOTE — Assessment & Plan Note (Signed)
 Resolved following panniculectomy. She is pleased with the results of her surgery.

## 2024-08-04 ENCOUNTER — Ambulatory Visit: Admitting: Nurse Practitioner

## 2024-08-05 ENCOUNTER — Ambulatory Visit: Admitting: Family

## 2024-08-05 ENCOUNTER — Ambulatory Visit (INDEPENDENT_AMBULATORY_CARE_PROVIDER_SITE_OTHER): Admitting: Primary Care

## 2024-08-05 ENCOUNTER — Encounter: Payer: Self-pay | Admitting: Primary Care

## 2024-08-05 VITALS — BP 93/63 | HR 80 | Temp 97.4°F | Ht 64.0 in | Wt 192.6 lb

## 2024-08-05 DIAGNOSIS — Z6833 Body mass index (BMI) 33.0-33.9, adult: Secondary | ICD-10-CM | POA: Diagnosis not present

## 2024-08-05 DIAGNOSIS — E66811 Obesity, class 1: Secondary | ICD-10-CM | POA: Diagnosis not present

## 2024-08-05 DIAGNOSIS — Z23 Encounter for immunization: Secondary | ICD-10-CM

## 2024-08-05 DIAGNOSIS — R0683 Snoring: Secondary | ICD-10-CM

## 2024-08-05 NOTE — Patient Instructions (Addendum)
  VISIT SUMMARY: Today, we discussed your ongoing sleep disturbances and snoring issues. You have been experiencing difficulty falling asleep, frequent awakenings, and episodes of gasping for air during the night. Despite losing 35 pounds over the past two years, your sleep issues persist. We also reviewed your history of severe GERD, which has been resolved post-surgery.  YOUR PLAN: -SLEEP DISTURBANCE WITH SNORING AND SUSPECTED OBSTRUCTIVE SLEEP APNEA: You have been experiencing chronic sleep disturbances, including difficulty falling asleep, frequent awakenings, and snoring. These symptoms suggest obstructive sleep apnea, a condition where your airway becomes blocked during sleep, causing you to wake up gasping for air. Untreated sleep apnea can lead to serious health issues like heart problems, stroke, and diabetes. We discussed the importance of a sleep study to diagnose and determine the severity of sleep apnea. Treatment options include weight loss, oral appliances, CPAP machines, and possibly surgery. We have ordered a home sleep study for you to complete.  -OBESITY (BMI 33): Your current BMI is 33, which is classified as obese and can increase the risk of sleep apnea. We discussed the role of weight loss in managing sleep apnea and the potential use of medications like GLP-1 receptor agonists (such as zepbound) to aid in weight loss if your sleep apnea is confirmed. These medications may not always be covered by insurance, depending on the severity of your condition.  INSTRUCTIONS: Please complete the home sleep study as ordered. Schedule a follow-up appointment to discuss the results of your sleep study and potential treatment options if sleep apnea is confirmed. Consider discussing weight loss options with your primary care provider if your BMI remains over 30 and sleep apnea is diagnosed.  Orders: Home sleep study through [facility]  Pneumonia vaccine   Follow-up Call when sleep  study completed for results/treatment options
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
## 2024-08-09 ENCOUNTER — Encounter: Payer: Self-pay | Admitting: Student

## 2024-08-09 ENCOUNTER — Ambulatory Visit: Admitting: Student

## 2024-08-09 ENCOUNTER — Ambulatory Visit: Admitting: Nurse Practitioner

## 2024-08-09 VITALS — BP 89/65 | HR 88 | Ht 64.0 in | Wt 190.0 lb

## 2024-08-09 DIAGNOSIS — Z9889 Other specified postprocedural states: Secondary | ICD-10-CM

## 2024-08-09 NOTE — Progress Notes (Signed)
 Patient is a 58 year old female who underwent panniculectomy with Dr. Waddell on 07/04/2024.  She is about 5 weeks postop.  She presents to the clinic today for postoperative follow-up.  Patient was last seen in the clinic on 07/25/2024.  At this visit, patient was doing really well.  On exam, abdomen was soft and nontender.  There were no fluid collections on exam.  Some of the skin did appear to be irritated, consistent with contact dermatitis.  The incision was overall healing well, there were few superficial scattered wounds.  No signs of infection on exam.  Today, patient reports she is doing well.  She states that she does have some itchiness, but she states that she has itchiness everywhere and this has been present since prior to surgery.  She otherwise denies any new issues or concerns.  She denies any infectious symptoms.  Chaperone present on exam.  On exam, patient is sitting upright in no acute distress.  Abdomen is soft and nontender.  There is no overlying erythema, no obvious fluid collections on exam.  There are some areas of irritation to the skin, but do appear to be slightly improved from previous exam.  Suspect that this is a contact dermatitis.  Incision overall is healing well, but there still are a few scattered superficial wounds throughout the incision, they do appear to be a little bit improved from previous exam.  There is no sign of infection on exam.  Recommended that patient continue with Vaseline to her incision daily.  Discussed with her that she no longer has to wear compression as of next week and may increase her activities next week.  Patient expressed understanding.  Discussed with the patient that she may take Benadryl at night and antihistamines during the day.  Did recommend that she follow-up with her PCP in regards to her itchiness.  Patient expressed understanding.  Patient to follow back up in a few weeks.  Instructed her to call if she has any questions or  concerns about anything.

## 2024-08-11 ENCOUNTER — Inpatient Hospital Stay (HOSPITAL_BASED_OUTPATIENT_CLINIC_OR_DEPARTMENT_OTHER): Admission: RE | Admit: 2024-08-11 | Source: Ambulatory Visit

## 2024-08-11 ENCOUNTER — Encounter (HOSPITAL_BASED_OUTPATIENT_CLINIC_OR_DEPARTMENT_OTHER): Payer: Self-pay

## 2024-08-15 ENCOUNTER — Telehealth: Payer: Self-pay

## 2024-08-15 ENCOUNTER — Other Ambulatory Visit: Payer: Self-pay

## 2024-08-15 DIAGNOSIS — R0683 Snoring: Secondary | ICD-10-CM

## 2024-08-15 NOTE — Telephone Encounter (Signed)
 Copied from CRM #[id number]. Topic: Clinical - Request for Lab/Test Order >> Aug 15, 2024 11:03 AM Joesph PARAS wrote: Reason for CRM: Patient rep from [facility] is calling to state they have not gotten an order for a sleep test. Note indicated that one should be place but no order appears to be placed at this time. Please investigate and send order to [facility].  Order placed for [facility]  [phone]

## 2024-08-15 NOTE — Telephone Encounter (Signed)
 Copied from CRM #[id number]. Topic: Clinical - Request for Lab/Test Order >> Aug 15, 2024 11:03 AM Joesph PARAS wrote: Reason for CRM: Patient rep from [facility] is calling to state they have not gotten an order for a sleep test. Note indicated that one should be place but no order appears to be placed at this time. Please investigate and send order to [facility].    F:[phone]   Duplicate.

## 2024-08-17 NOTE — Telephone Encounter (Signed)
 [employer] rep called again today to verify that a home sleep study order was placed in patient's chart.  They need to have it faxed to this phone number [phone].  Phone number is [phone] if you need to call.  Thanks

## 2024-08-17 NOTE — Telephone Encounter (Signed)
 Order was sent to [facility] 10/20. I have faxed over the referral to the fax given. NFN

## 2024-08-21 ENCOUNTER — Ambulatory Visit (HOSPITAL_BASED_OUTPATIENT_CLINIC_OR_DEPARTMENT_OTHER)
Admission: RE | Admit: 2024-08-21 | Discharge: 2024-08-21 | Disposition: A | Source: Ambulatory Visit | Attending: Family | Admitting: Family

## 2024-08-21 DIAGNOSIS — Z1231 Encounter for screening mammogram for malignant neoplasm of breast: Secondary | ICD-10-CM | POA: Insufficient documentation

## 2024-08-23 NOTE — Progress Notes (Unsigned)
 Patient is a 58 year old female who underwent panniculectomy with Dr. Waddell on 07/04/2024.  Patient is 7 weeks postop.  She presents to the clinic today for postoperative follow-up.     Patient was last seen in the clinic on 08/09/2024.  At this visit, patient was overall doing well.  She did report that she had some itchiness, otherwise denied any concerns.  On exam, abdomen was soft and nontender.  There was some irritation to the skin.  Incision was overall healing well, there were still a few scattered superficial wounds throughout.  Recommended that patient continue with Vaseline to the incision daily.  Today,

## 2024-08-24 ENCOUNTER — Ambulatory Visit (INDEPENDENT_AMBULATORY_CARE_PROVIDER_SITE_OTHER): Admitting: Plastic Surgery

## 2024-08-24 ENCOUNTER — Ambulatory Visit: Payer: Self-pay | Admitting: Family

## 2024-08-24 ENCOUNTER — Ambulatory Visit (INDEPENDENT_AMBULATORY_CARE_PROVIDER_SITE_OTHER): Admitting: Student

## 2024-08-24 VITALS — BP 95/69 | HR 97 | Wt 190.0 lb

## 2024-08-24 DIAGNOSIS — N62 Hypertrophy of breast: Secondary | ICD-10-CM

## 2024-08-24 DIAGNOSIS — R21 Rash and other nonspecific skin eruption: Secondary | ICD-10-CM

## 2024-08-24 DIAGNOSIS — M546 Pain in thoracic spine: Secondary | ICD-10-CM

## 2024-08-24 DIAGNOSIS — M542 Cervicalgia: Secondary | ICD-10-CM | POA: Diagnosis not present

## 2024-08-24 DIAGNOSIS — Z9889 Other specified postprocedural states: Secondary | ICD-10-CM

## 2024-08-24 DIAGNOSIS — D6859 Other primary thrombophilia: Secondary | ICD-10-CM

## 2024-08-25 NOTE — Progress Notes (Signed)
 Referring Provider Daryl Setter, NP [address] [place],  [place] [address]   CC: No chief complaint on file.     Melissa Santiago is an 58 y.o. female.  HPI: [name] is well-known to the clinic as she has previously undergone a panniculectomy.  She is very happy with the results and now returns today to discuss a bilateral breast reduction.  Patient states that she has had large breast for most of her life developing large breasts during puberty.  She states that the breast have always caused upper back and neck pain and have caused her to have a forward tilting posture.  She also complains of rashes under her breasts.  She is interested in a bilateral breast reduction.  She does have a family history of protein S deficiency.  At her last procedure she used Xarelto  up until 2 days prior to her surgery and then resumed postoperatively.  She did well with no bleeding and no complications specifically no DVT.  She would like to follow the same protocol for her breast reduction.  Allergies  Allergen Reactions   Tolmetin Hives, Itching and Rash    Outpatient Encounter Medications as of 08/24/2024  Medication Sig   buPROPion  (WELLBUTRIN  XL) 150 MG 24 hr tablet TAKE 1 TABLET BY MOUTH EVERY DAY   cyanocobalamin  (VITAMIN B12) 1000 MCG/ML injection INJECT 1ML INTO MUSCLE ONCE WEEKLY FOR 4 WEEKS, THEN 1 ML ONCE MONTHLY   DULoxetine  (CYMBALTA ) 60 MG capsule TAKE 1 CAPSULE BY MOUTH EVERY DAY   folic acid  (FOLVITE ) 1 MG tablet Take 1 tablet (1 mg total) by mouth daily.   hydrOXYzine  (VISTARIL ) 25 MG capsule TAKE 1 CAPSULE (25 MG TOTAL) BY MOUTH EVERY 8 (EIGHT) HOURS AS NEEDED.   Menthol, Topical Analgesic, 4 % GEL Apply 1 Application topically as needed.   NEEDLE, DISP, 25 G (B-D DISP NEEDLE 25GX1) 25G X 1 MISC For B12 inj   nystatin  powder Apply twice daily as needed to skin fold   rivaroxaban  (XARELTO ) 10 MG TABS tablet Take 1 tablet (10 mg total) by mouth daily. Start  taking 1 week prior to surgery. Stop 2 days before surgery and restart the day after for 1 month.   valACYclovir  (VALTREX ) 500 MG tablet TAKE 1 TABLET (500 MG TOTAL) BY MOUTH DAILY.   No facility-administered encounter medications on file as of 08/24/2024.     Past Medical History:  Diagnosis Date   Acute lumbar radiculopathy 06/29/2013   Chest pain in adult 05/24/2018   Chronic constipation 05/24/2018   Gastroesophageal reflux disease 05/24/2018   GERD (gastroesophageal reflux disease)    Left sided sciatica 05/10/2013   Lumbar disc herniation 06/29/2013   Morbid obesity (HCC) 05/24/2018   Obesity    Osteopenia 03/23/2015   S/P bariatric surgery 06/30/2013    Past Surgical History:  Procedure Laterality Date   BREAST BIOPSY Left 2014   CHOLECYSTECTOMY  2022   LAPAROSCOPIC GASTRIC SLEEVE RESECTION  2014   LUMBAR DISC SURGERY  2014/2016   PANNICULECTOMY N/A 07/04/2024   Procedure: PANNICULECTOMY;  Surgeon: Waddell Leonce NOVAK, MD;  Location: [facility];  Service: Plastics;  Laterality: N/A;    Family History  Problem Relation Age of Onset   Arthritis Mother    Early death Mother    Mental illness Mother    Alcohol abuse Father    Arthritis Father    Early death Sister    Thyroid  cancer Sister  Hypertension Sister    Heart attack Maternal Grandfather    Uterine cancer Sister    Hypertension Sister    Cervical cancer Sister    Depression Sister    Hypertension Sister    Colon cancer Neg Hx    Esophageal cancer Neg Hx    Breast cancer Neg Hx    Rectal cancer Neg Hx    Stomach cancer Neg Hx     Social History   Social History Narrative   [place] [place] , Divorced since 2015   Works as a research officer, political party for [employer]   2 daughters- one in [place]- lives in [place] , one in [place] - works as a runner, broadcasting/film/video   Enjoys spending time with her husband   Has a dog and will adopt another soon.      Review of Systems General: Denies fevers, chills,  weight loss CV: Denies chest pain, shortness of breath, palpitations Breast: Patient has large breasts which she feels are contributing to her upper back and neck pain as well as causing ongoing rashes.  Physical Exam    08/24/2024    1:39 PM 08/09/2024   10:15 AM 08/05/2024    8:56 AM  Vitals with BMI  Height  5' 4 5' 4  Weight 190 lbs 190 lbs 192 lbs 10 oz  BMI 32.6 32.6 33.04  Systolic 95 89 93  Diastolic 69 65 63  Pulse 97 88 80    General:  No acute distress,  Alert and oriented, Non-Toxic, Normal speech and affect Breast: She has large pendulous breast with grade 3 ptosis.  There are no rashes noted today.  She has no dominant masses on physical examination.  Her sternal notch to nipple distance on the right is 36 cm and 36 cm on the left her nipple to fold distance on the right is 18 cm and 18 cm on the left. Mammogram: October 2025 BI-RADS 1 Assessment/Plan Macromastia: Patient has large breasts and feels there contributing to her upper back and neck pain.  Maximum weight that can be removed is 500 g per breast.  She would like to have a breast reduction.  We discussed the risks including bleeding, infection, and seroma formation.  She understands she will have drains like she did at her last procedure.  These will be removed the day after surgery.  She understands that her bleeding risk is much higher due to her need for perioperative Xarelto .  We did discuss the risk of nipple loss due to nipple ischemia as well as the increased difficulty of interpreting mammograms after a bilateral breast reduction.  We discussed the postoperative limitations which again is similar to her panniculectomy and include no heavy lifting greater than 20 pounds, no vigorous activity, no submerging incisions in water for 6 weeks.  She will be requested to begin ambulation immediately after surgery and may begin light activity as tolerated.  All questions were answered to her satisfaction.  Photographs  were obtained today with her consent.  Will submit her for a bilateral breast reduction at her request.  Leonce KATHEE Birmingham 08/25/2024, 3:22 PM

## 2024-08-30 ENCOUNTER — Encounter

## 2024-08-30 DIAGNOSIS — R0683 Snoring: Secondary | ICD-10-CM

## 2024-09-02 ENCOUNTER — Ambulatory Visit: Admitting: Family

## 2024-09-02 ENCOUNTER — Telehealth: Payer: Self-pay

## 2024-09-02 ENCOUNTER — Encounter: Payer: Self-pay | Admitting: Family

## 2024-09-02 VITALS — BP 96/78 | HR 93 | Temp 97.8°F | Resp 16 | Ht 64.0 in | Wt 189.0 lb

## 2024-09-02 DIAGNOSIS — L299 Pruritus, unspecified: Secondary | ICD-10-CM

## 2024-09-02 DIAGNOSIS — E538 Deficiency of other specified B group vitamins: Secondary | ICD-10-CM | POA: Diagnosis not present

## 2024-09-02 DIAGNOSIS — E785 Hyperlipidemia, unspecified: Secondary | ICD-10-CM | POA: Diagnosis not present

## 2024-09-02 DIAGNOSIS — Z0000 Encounter for general adult medical examination without abnormal findings: Secondary | ICD-10-CM

## 2024-09-02 DIAGNOSIS — F419 Anxiety disorder, unspecified: Secondary | ICD-10-CM

## 2024-09-02 DIAGNOSIS — F32A Depression, unspecified: Secondary | ICD-10-CM

## 2024-09-02 DIAGNOSIS — Z23 Encounter for immunization: Secondary | ICD-10-CM

## 2024-09-02 NOTE — Telephone Encounter (Signed)
 Copied from CRM #[id number]. Topic: Clinical - Lab/Test Results >> Sep 02, 2024 10:27 AM Rilla NOVAK wrote: Reason for CRM: Patient calling for sleep study results.  Please call patient [phone].    Called and spoke to pt - pt states that she had her HST done on 10/26 and 10/27, and sent everything back on 10/28. I am not seeing her HST results in her chart, or that she completed it. Could you please advise?

## 2024-09-02 NOTE — Assessment & Plan Note (Signed)
  Routine wellness visit with focus on exercise, diet, and lifestyle. Sober for a year with church support. Colonoscopy and mammogram are current. Pap smear due. Eye and dental exams up to date. - Administered flu and tetanus vaccines today. - Encouraged scheduling Pap smear and shingles vaccine on a quiet day.  Immunizations Tetanus and flu vaccines were due. Shingles vaccine recommended on a quiet day. Hepatitis B vaccine also recommended. - Administered tetanus and flu vaccines today. - Schedule shingles vaccine on a quiet day. - Consider hepatitis B vaccine.

## 2024-09-02 NOTE — Progress Notes (Signed)
 Subjective:     Patient ID: Melissa Santiago, female    DOB: [birth year], 58 y.o.   MRN: [id number]  Chief Complaint  Patient presents with   Annual Exam    HPI  Discussed the use of AI scribe software for clinical note transcription with the patient, who gave verbal consent to proceed.  History of Present Illness  Melissa Santiago is a 58 year old female who presents for an update on her physical exam and vaccination status.  She is awaiting insurance authorization for a surgical consult for a breast infection, with the procedure expected in January or February. She previously received a pneumonia vaccine and is up to date with her eye exams and dental cleanings. Her last colonoscopy was in May 2020, with a follow-up due in 2030. She had a Pap smear in February 2022, and her mammogram results were normal.  She has been more active recently due to volunteering, which involves delivering meals and assisting with housework. She has been sober for a year and has a strong support system through her church and recovery programs.  She underwent a sleep study and is awaiting results, suspecting sleep apnea due to morning headaches. She experiences muscle and joint pain, particularly in her neck, and has frequent headaches. She experiences itching, particularly in the fall and spring, and sometimes takes allergy medications like Claritin, Zyrtec, or Benadryl, which provide some relief.  She is on Wellbutrin  and Cymbalta  for mood, and receives monthly B12 injections. She takes a prescribed folic acid  supplement due to a history of bariatric surgery affecting nutrient absorption. She only takes Xarelto  as a prophylactic measure before surgeries or long flights, following a specific regimen.  She has a history of abdominal surgery last year and has learned that her sister has papillary thyroid  cancer, which was a consideration in her past interest in GLP-1 medications. Immunizations: Td and flu  today. Diet: improved Exercise:  improving Colonoscopy: 02/2019 due 02/2029 Pap Smear: declines today  Mammogram: 5/25 Vision: up to date Dental:  up to date      Health Maintenance Due  Topic Date Due   HIV Screening  Never done   Hepatitis C Screening  Never done   Hepatitis B Vaccines 19-59 Average Risk (1 of 3 - 19+ 3-dose series) Never done   Zoster Vaccines- Shingrix (1 of 2) Never done   DTaP/Tdap/Td (2 - Td or Tdap) 06/23/2022   Cervical Cancer Screening (HPV/Pap Cotest)  12/15/2023    Past Medical History:  Diagnosis Date   Acute lumbar radiculopathy 06/29/2013   Chest pain in adult 05/24/2018   Chronic constipation 05/24/2018   Gastroesophageal reflux disease 05/24/2018   GERD (gastroesophageal reflux disease)    Left sided sciatica 05/10/2013   Lumbar disc herniation 06/29/2013   Morbid obesity (HCC) 05/24/2018   Obesity    Osteopenia 03/23/2015   S/P bariatric surgery 06/30/2013    Past Surgical History:  Procedure Laterality Date   BREAST BIOPSY Left 2014   CHOLECYSTECTOMY  2022   LAPAROSCOPIC GASTRIC SLEEVE RESECTION  2014   LUMBAR DISC SURGERY  2014/2016   PANNICULECTOMY N/A 07/04/2024   Procedure: PANNICULECTOMY;  Surgeon: Waddell Leonce NOVAK, MD;  Location: [facility];  Service: Plastics;  Laterality: N/A;    Family History  Problem Relation Age of Onset   Arthritis Mother    Early death Mother    Mental illness Mother    Alcohol abuse Father    Arthritis Father  Thyroid  cancer Sister        papillary thyroid  cancer   Hypertension Sister    Uterine cancer Sister    Hypertension Sister    Cervical cancer Sister    Depression Sister    Hypertension Sister    Heart attack Maternal Grandfather    Colon cancer Neg Hx    Esophageal cancer Neg Hx    Breast cancer Neg Hx    Rectal cancer Neg Hx    Stomach cancer Neg Hx     Social History   Socioeconomic History   Marital status: Married    Spouse name: Franky   Number of  children: 2   Years of education: Not on file   Highest education level: Associate degree: academic program  Occupational History   Occupation: Nature conservation officer  Tobacco Use   Smoking status: Former    Current packs/day: 0.00    Average packs/day: 0.5 packs/day for 15.0 years (7.5 ttl pk-yrs)    Types: Cigarettes    Start date: 4    Quit date: 2006    Years since quitting: 19.8   Smokeless tobacco: Never  Vaping Use   Vaping status: Never Used  Substance and Sexual Activity   Alcohol use: Not Currently   Drug use: Never   Sexual activity: Yes    Partners: Male    Birth control/protection: I.U.D.  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    Social Drivers of Corporate Investment Banker Strain: Low Risk  (09/01/2024)   Overall Financial Resource Strain (CARDIA)    Difficulty of Paying Living Expenses: Not hard at all  Food Insecurity: No Food Insecurity (09/01/2024)   Hunger Vital Sign    Worried About Running Out of Food in the Last Year: Never true    Ran Out of Food in the Last Year: Never true  Transportation Needs: No Transportation Needs (09/01/2024)   PRAPARE - Administrator, Civil Service (Medical): No    Lack of Transportation (Non-Medical): No  Physical Activity: Inactive (09/01/2024)   Exercise Vital Sign    Days of Exercise per Week: 0 days    Minutes of Exercise per Session: Not on file  Stress: Stress Concern Present (09/01/2024)   [employer] of Occupational Health - Occupational Stress Questionnaire    Feeling of Stress: Rather much  Social Connections: Socially Integrated (09/01/2024)   Social Connection and Isolation Panel    Frequency of Communication with  Friends and Family: More than three times a week    Frequency of Social Gatherings with Friends and Family: Once a week    Attends Religious Services: More than 4 times per year    Active Member of [employer] or Organizations: Yes    Attends Banker Meetings: More than 4 times per year    Marital Status: Married  Catering Manager Violence: Not At Risk (03/22/2024)   Humiliation, Afraid, Rape, and Kick questionnaire    Fear of Current or Ex-Partner: No    Emotionally Abused: No    Physically Abused: No    Sexually Abused: No    Outpatient Medications Prior to  Visit  Medication Sig Dispense Refill   buPROPion  (WELLBUTRIN  XL) 150 MG 24 hr tablet TAKE 1 TABLET BY MOUTH EVERY DAY 90 tablet 1   cyanocobalamin  (VITAMIN B12) 1000 MCG/ML injection INJECT 1ML INTO MUSCLE ONCE WEEKLY FOR 4 WEEKS, THEN 1 ML ONCE MONTHLY 6 mL 1   DULoxetine  (CYMBALTA ) 60 MG capsule TAKE 1 CAPSULE BY MOUTH EVERY DAY 90 capsule 1   folic acid  (FOLVITE ) 1 MG tablet Take 1 tablet (1 mg total) by mouth daily. 30 tablet 11   hydrOXYzine  (VISTARIL ) 25 MG capsule TAKE 1 CAPSULE (25 MG TOTAL) BY MOUTH EVERY 8 (EIGHT) HOURS AS NEEDED. 30 capsule 2   Menthol, Topical Analgesic, 4 % GEL Apply 1 Application topically as needed.     NEEDLE, DISP, 25 G (B-D DISP NEEDLE 25GX1) 25G X 1 MISC For B12 inj 50 each 1   nystatin  powder Apply twice daily as needed to skin fold 60 g 1   rivaroxaban  (XARELTO ) 10 MG TABS tablet Take 1 tablet (10 mg total) by mouth daily. Start taking 1 week prior to surgery. Stop 2 days before surgery and restart the day after for 1 month. 30 tablet 1   valACYclovir  (VALTREX ) 500 MG tablet TAKE 1 TABLET (500 MG TOTAL) BY MOUTH DAILY. 90 tablet 1   No facility-administered medications prior to visit.    Allergies  Allergen Reactions   Tolmetin Hives, Itching and Rash    Review of Systems  Constitutional:  Negative for weight loss.  HENT:  Negative for congestion and hearing loss.   Eyes:   Negative for blurred vision.  Respiratory:  Negative for cough.   Cardiovascular:  Negative for leg swelling.  Gastrointestinal:  Negative for constipation and diarrhea.  Genitourinary:  Negative for dysuria and frequency.  Musculoskeletal:  Positive for joint pain and myalgias.  Neurological:  Positive for headaches (daily AM headaches, ? OSA, sleep study pending).  Psychiatric/Behavioral:  Negative for depression. The patient is not nervous/anxious.        Objective:    Physical Exam   BP 96/78 (BP Location: Right Arm, Patient Position: Sitting, Cuff Size: Normal)   Pulse 93   Temp 97.8 F (36.6 C) (Oral)   Resp 16   Ht 5' 4 (1.626 m)   Wt 189 lb (85.7 kg)   LMP 01/12/2019 (Approximate)   SpO2 98%   BMI 32.44 kg/m  Wt Readings from Last 3 Encounters:  09/02/24 189 lb (85.7 kg)  08/24/24 190 lb (86.2 kg)  08/09/24 190 lb (86.2 kg)   Lab Results  Component Value Date   CHOL 229 (H) 12/14/2020   HDL 82.20 12/14/2020   LDLCALC 133 (H) 12/14/2020   TRIG 69.0 12/14/2020   CHOLHDL 3 12/14/2020   Physical Exam  Constitutional: She is oriented to person, place, and time. She appears well-developed and well-nourished. No distress.  HENT:  Head: Normocephalic and atraumatic.  Right Ear: Tympanic membrane and ear canal normal.  Left Ear: Tympanic membrane and ear canal normal.  Mouth/Throat: Oropharynx is clear and moist.  Eyes: Pupils are equal, round, and reactive to light. No scleral icterus.  Neck: Normal range of motion. No thyromegaly present.  Cardiovascular: Normal rate and regular rhythm.   No murmur heard. Pulmonary/Chest: Effort normal and breath sounds normal. No respiratory distress. He has no wheezes. She has no rales. She exhibits no tenderness.  Abdominal: Soft. Bowel sounds are normal. She exhibits no distension and no mass. There is no tenderness. There is no  rebound and no guarding.  Musculoskeletal: She exhibits no edema.  Lymphadenopathy:    She has  no cervical adenopathy.  Neurological: She is alert and oriented to person, place, and time. She has normal patellar reflexes. She exhibits normal muscle tone. Coordination normal.  Skin: Skin is warm and dry.  Psychiatric: She has a normal mood and affect. Her behavior is normal. Judgment and thought content normal.  Breast/Pelvic:  deferred           Assessment & Plan:       Assessment & Plan:   Problem List Items Addressed This Visit       Unprioritized   Pruritus    Intermittent pruritus possibly allergy-related. Antihistamines provide relief. Benadryl causes drowsiness. - Referred to allergist in [facility]. - Consider using Claritin or Zyrtec for 24-hour relief.      Preventative health care - Primary    Routine wellness visit with focus on exercise, diet, and lifestyle. Sober for a year with church support. Colonoscopy and mammogram are current. Pap smear due. Eye and dental exams up to date. - Administered flu and tetanus vaccines today. - Encouraged scheduling Pap smear and shingles vaccine on a quiet day.  Immunizations Tetanus and flu vaccines were due. Shingles vaccine recommended on a quiet day. Hepatitis B vaccine also recommended. - Administered tetanus and flu vaccines today. - Schedule shingles vaccine on a quiet day. - Consider hepatitis B vaccine.      Anxiety and depression    Mood stable on Wellbutrin  and Cymbalta . - Continue Wellbutrin  and Cymbalta .      Other Visit Diagnoses       Needs flu shot       Relevant Orders   Flu vaccine trivalent PF, 6mos and older(Flulaval,Afluria,Fluarix,Fluzone)     Chronic pruritus       Relevant Orders   Ambulatory referral to Allergy     Hyperlipidemia, unspecified hyperlipidemia type       Relevant Orders   Comp Met (CMET)   Lipid panel     B12 deficiency       Relevant Orders   B12 and Folate Panel       I am having Essa E. Wandrey maintain her nystatin , Menthol (Topical Analgesic), folic  acid, DULoxetine , rivaroxaban , B-D DISP NEEDLE 25GX1, hydrOXYzine , cyanocobalamin , valACYclovir , and buPROPion .  No orders of the defined types were placed in this encounter.

## 2024-09-02 NOTE — Assessment & Plan Note (Signed)
  Mood stable on Wellbutrin  and Cymbalta . - Continue Wellbutrin  and Cymbalta .

## 2024-09-02 NOTE — Assessment & Plan Note (Signed)
  Intermittent pruritus possibly allergy-related. Antihistamines provide relief. Benadryl causes drowsiness. - Referred to allergist in [facility]. - Consider using Claritin or Zyrtec for 24-hour relief.

## 2024-09-02 NOTE — Patient Instructions (Signed)
 VISIT SUMMARY:  Today, you had a routine wellness visit where we focused on your exercise, diet, and lifestyle. We administered your flu and tetanus vaccines, and discussed scheduling a Pap smear and shingles vaccine. We also addressed your intermittent itching, cholesterol levels, vitamin deficiencies, and other health concerns.  YOUR PLAN:  ADULT WELLNESS VISIT: Routine wellness visit with focus on exercise, diet, and lifestyle. Sober for a year with church support. Colonoscopy and mammogram are current. Pap smear due. Eye and dental exams up to date. -Administered flu and tetanus vaccines today. -Encouraged scheduling Pap smear and shingles vaccine on a quiet day.  IMMUNIZATIONS: Tetanus and flu vaccines were due. Shingles vaccine recommended on a quiet day. Hepatitis B vaccine also recommended. -Administered tetanus and flu vaccines today. -Schedule shingles vaccine on a quiet day. -Consider hepatitis B vaccine.  PRURITUS: Intermittent itching possibly allergy-related. Antihistamines provide relief. Benadryl causes drowsiness. -Referred to allergist in [facility]. -Consider using Claritin or Zyrtec for 24-hour relief.  HYPERLIPIDEMIA: Cholesterol levels elevated. Last check was some time ago. -Ordered cholesterol test.  DEFICIENCY OF B GROUP VITAMINS: B12 low normal and folate low six months ago. Bariatric surgery may affect absorption. -Repeated B12 and folate levels today.  HISTORY OF BARIATRIC SURGERY WITH ASSOCIATED NUTRITIONAL MONITORING: Bariatric surgery may affect nutrient absorption, including B12 and folate. -Continue monitoring B12 and folate levels.  ALCOHOL USE DISORDER, IN REMISSION: Sober for a year with strong support. Cravings occur with stress. -Continue support system and sobriety efforts.  POSSIBLE OBSTRUCTIVE SLEEP APNEA: Frequent morning headaches suggestive of sleep apnea. Awaiting sleep study results. -Await sleep study results.  CHRONIC MUSCULOSKELETAL  PAIN (NECK AND JOINTS): Chronic neck and joint pain likely musculoskeletal.  MAJOR DEPRESSIVE DISORDER, STABLE ON THERAPY: Mood stable on Wellbutrin  and Cymbalta . -Continue Wellbutrin  and Cymbalta .

## 2024-09-02 NOTE — Telephone Encounter (Signed)
 It is waiting on signature by Dr. Neysa

## 2024-09-03 LAB — COMPREHENSIVE METABOLIC PANEL WITH GFR
AG Ratio: 2 (calc) (ref 1.0–2.5)
ALT: 13 U/L (ref 6–29)
AST: 18 U/L (ref 10–35)
Albumin: 4.3 g/dL (ref 3.6–5.1)
Alkaline phosphatase (APISO): 102 U/L (ref 37–153)
BUN: 11 mg/dL (ref 7–25)
CO2: 29 mmol/L (ref 20–32)
Calcium: 9.5 mg/dL (ref 8.6–10.4)
Chloride: 104 mmol/L (ref 98–110)
Creat: 0.66 mg/dL (ref 0.50–1.03)
Globulin: 2.2 g/dL (ref 1.9–3.7)
Glucose, Bld: 77 mg/dL (ref 65–99)
Potassium: 4.2 mmol/L (ref 3.5–5.3)
Sodium: 140 mmol/L (ref 135–146)
Total Bilirubin: 0.4 mg/dL (ref 0.2–1.2)
Total Protein: 6.5 g/dL (ref 6.1–8.1)
eGFR: 103 mL/min/1.73m2 (ref >=60)

## 2024-09-03 LAB — LIPID PANEL
Cholesterol: 205 mg/dL — ABNORMAL HIGH (ref <200)
HDL: 76 mg/dL (ref >=50)
LDL Cholesterol (Calc): 109 mg/dL — ABNORMAL HIGH
Non-HDL Cholesterol (Calc): 129 mg/dL (ref <130)
Total CHOL/HDL Ratio: 2.7 (calc) (ref <5.0)
Triglycerides: 103 mg/dL (ref <150)

## 2024-09-03 LAB — B12 AND FOLATE PANEL
Folate: 15.4 ng/mL
Vitamin B-12: 1873 pg/mL — ABNORMAL HIGH (ref 200–1100)

## 2024-09-04 DIAGNOSIS — G4733 Obstructive sleep apnea (adult) (pediatric): Secondary | ICD-10-CM | POA: Diagnosis not present

## 2024-09-05 ENCOUNTER — Ambulatory Visit: Payer: Self-pay | Admitting: Family

## 2024-09-06 ENCOUNTER — Ambulatory Visit: Payer: Self-pay | Admitting: Primary Care

## 2024-09-06 ENCOUNTER — Telehealth: Payer: Self-pay | Admitting: Primary Care

## 2024-09-06 ENCOUNTER — Encounter: Payer: Self-pay | Admitting: Primary Care

## 2024-09-06 ENCOUNTER — Telehealth (INDEPENDENT_AMBULATORY_CARE_PROVIDER_SITE_OTHER): Admitting: Primary Care

## 2024-09-06 VITALS — Ht 64.0 in | Wt 192.0 lb

## 2024-09-06 DIAGNOSIS — G4733 Obstructive sleep apnea (adult) (pediatric): Secondary | ICD-10-CM | POA: Diagnosis not present

## 2024-09-06 DIAGNOSIS — E66811 Obesity, class 1: Secondary | ICD-10-CM

## 2024-09-06 MED ORDER — ZEPBOUND 2.5 MG/0.5ML ~~LOC~~ SOAJ: 2.5000 mg | SUBCUTANEOUS | 1 refills | Status: DC

## 2024-09-06 NOTE — Patient Instructions (Addendum)
  VISIT SUMMARY: Today, we discussed your ongoing sleep disturbances and snoring, which have been confirmed as severe obstructive sleep apnea through a recent home sleep study. We also addressed your obesity and its potential impact on your sleep apnea.  YOUR PLAN: -SEVERE OBSTRUCTIVE SLEEP APNEA: Severe obstructive sleep apnea is a condition where your airway becomes blocked during sleep, causing you to stop breathing multiple times per hour. We have started you on CPAP therapy, which involves wearing a mask that provides pressured air to keep your airway open. You should use the CPAP machine every night and possibly during naps or while watching TV to meet insurance compliance requirements. We will follow up in 6-8 weeks to see how you are adjusting to the therapy and if any adjustments are needed.  -OBESITY: Obesity is a condition where you have an excessive amount of body fat, which can contribute to sleep apnea. We have started you on a weight loss medication called Zepbound, which you will take as an injection once a week. The dose will be increased every 4 weeks if you tolerate it well. We have also submitted a request to your insurance for coverage of this medication. We will follow up in 6-8 weeks to monitor your progress and any side effects.  INSTRUCTIONS: Please schedule a follow-up appointment in 6-8 weeks to assess your progress with CPAP therapy and the weight loss medication. Make sure to use the CPAP machine as instructed and report any issues or side effects you experience with the medication.  Notify your provider if you are planning to have a procedure/surgery, as this medication will need to be stopped prior.   Follow-up 6-8 weeks with Beth NP for CPAP compliance and obesity management
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
## 2024-09-06 NOTE — Telephone Encounter (Signed)
 Routing to the front desk to make sure this is scheduled.

## 2024-09-06 NOTE — Progress Notes (Signed)
 HST showed severe sleep apnea, please schedule virtual visit with me to discuss starting CPAP

## 2024-09-06 NOTE — Telephone Encounter (Signed)
 Needs follow-up with Beth NP in person or virtually in 6-8 weeks for CPAP compliance/ obesity management

## 2024-09-07 ENCOUNTER — Telehealth: Payer: Self-pay

## 2024-09-07 NOTE — Telephone Encounter (Signed)
*  Pulm  Pharmacy Patient Advocate Encounter   Received notification from CoverMyMeds that prior authorization for Zepbound 2.5MG /0.5ML pen-injectors  is required/requested.   Insurance verification completed.   The patient is insured through [employer].   Per test claim: PA required; PA submitted to above mentioned insurance via Latent Key/confirmation #/EOC [id number] Status is pending

## 2024-09-07 NOTE — Progress Notes (Signed)
 Pt seen in office by BW on 09/06/24 and results were reviewed. NFN

## 2024-09-07 NOTE — Telephone Encounter (Signed)
 Appt scheduled

## 2024-09-07 NOTE — Telephone Encounter (Signed)
 See OV notes from 09/06/24. Sleep study was reviewed with pt. NFN.

## 2024-09-12 ENCOUNTER — Telehealth: Payer: Self-pay | Admitting: Pharmacist

## 2024-09-12 ENCOUNTER — Other Ambulatory Visit (HOSPITAL_COMMUNITY): Payer: Self-pay

## 2024-09-12 NOTE — Telephone Encounter (Signed)
 Appeal has been submitted. Will advise when response is received or follow up in 1 week. Please be advised that most companies may take 30 days to make a decision.  The only option to submit an appeal for patient's insurance is via mail:  Chief Technology Officer and [employer] [address] [address] [place]  [address]   Sent certified mail.  Thank you, Devere Pandy, PharmD Clinical Pharmacist  [place]  Direct Dial: [phone]

## 2024-09-12 NOTE — Telephone Encounter (Signed)
 Pharmacy Patient Advocate Encounter  Received notification from [facility] that Prior Authorization for Zepbound 2.5% has been DENIED.  Full denial letter will be uploaded to the media tab. See denial reason below.   The patient advocate team submitted a prior authorization (PA) for Zepbound for the treatment of moderate to severe obstructive sleep apnea (OSA).   However, [employer] returned a denial referencing weight management instead. This denial appears to be due to outdated or incomplete clinical guidelines within the payer's system.   The patient advocate team will automatically initiate an appeal to address this determination.

## 2024-09-12 NOTE — Telephone Encounter (Signed)
 Copied from CRM #[id number]. Topic: Clinical - Medication Prior Auth >> Sep 12, 2024  9:26 AM Ismael A wrote: Reason for CRM: patient was calling to confirm if prior auth for Zepbound has been approved - advised of most recent note *Pulm   Pharmacy Patient Advocate Encounter   Received notification from CoverMyMeds that prior authorization for Zepbound 2.5MG /0.5ML pen-injectors  is required/requested.   Insurance verification completed.   The patient is insured through [employer].   Per test claim: PA required; PA submitted to above mentioned insurance via Latent Key/confirmation #/EOC [id number] Status is pending   I called and spoke to pt. Pt informed of the denial of Zepbound and verbalized understanding. NFN

## 2024-09-16 DIAGNOSIS — G4733 Obstructive sleep apnea (adult) (pediatric): Secondary | ICD-10-CM | POA: Diagnosis not present

## 2024-09-16 NOTE — Telephone Encounter (Signed)
 Copied from CRM [id number]. Topic: Referral - Prior Authorization Question >> Sep 15, 2024  8:36 AM Melissa Santiago wrote: Reason for CRM: Pt called in asking about her prior authorization stating she was told to appeal with the verbiage added that it was suggested by her sleep provider for sleep apnea to get the appeal to go through.  I did let her know that the clinical pharmacist at the clinic stated she made the appeal on 09/12/2024 at 4:16pm. She stated she would follow up when the response of the appeal was received or follow up in 1 week. She also stated to be advised that most companies may take 30 days to make a decision on the appeal as well as the only option to submit the appeal for patient's insurance is via mail at this Time.  [address] and Appeals [address] [address] [place], [place]  [address]    Sent certified mail.   Pt wanted to know if the verbiage for the appeal can be changed to include the focal reason for the medication recommended to her by per pulmonologist was to assist her with her sleep apnea.   Pt's phone number is [phone] ok to leave a vm.

## 2024-09-17 ENCOUNTER — Other Ambulatory Visit: Payer: Self-pay | Admitting: Family

## 2024-09-19 ENCOUNTER — Other Ambulatory Visit (HOSPITAL_COMMUNITY): Payer: Self-pay

## 2024-09-26 ENCOUNTER — Other Ambulatory Visit (HOSPITAL_COMMUNITY): Payer: Self-pay

## 2024-09-26 ENCOUNTER — Encounter: Payer: Self-pay | Admitting: Family

## 2024-09-26 MED ORDER — VALACYCLOVIR HCL 500 MG PO TABS: 500.0000 mg | ORAL_TABLET | Freq: Every day | ORAL | 0 refills | Status: AC

## 2024-10-10 NOTE — H&P (View-Only) (Signed)
 Patient ID: Melissa Santiago, female    DOB: [birth year], 58 y.o.   MRN: [id number]  No chief complaint on file.   No diagnosis found.   History of Present Illness: Melissa Santiago is a 58 y.o.  female  with a history of macromastia.  She presents for preoperative evaluation for upcoming procedure, bilateral breast reduction, scheduled for 10/18/2024 with Dr. Waddell.  The patient {HAS HAS WNU:[id number]} had problems with anesthesia. ***  Summary of Previous Visit: Patient met with Dr. Waddell for consult 08/24/2024.  At that time, discussed breast reduction surgery.  Personal history of protein S deficiency.  She required that her Xarelto  be held 2 days prior to surgery and resumed shortly thereafter when she had panniculectomy earlier this year.  Plan is to do the same again for DVT prophylaxis.  Estimated tissue to be removed is 500 g per side.  STN 36 cm each breast.  Job: ***  PMH Significant for: Macromastia, protein S deficiency on Xarelto , weight loss on Zepbound , history of bariatric surgery and recent panniculectomy, chronic constipation.   Past Medical History: Allergies: Allergies[1]  Current Medications: Current Medications[2]  Past Medical Problems: Past Medical History:  Diagnosis Date   Acute lumbar radiculopathy 06/29/2013   Chest pain in adult 05/24/2018   Chronic constipation 05/24/2018   Gastroesophageal reflux disease 05/24/2018   GERD (gastroesophageal reflux disease)    Left sided sciatica 05/10/2013   Lumbar disc herniation 06/29/2013   Morbid obesity (HCC) 05/24/2018   Obesity    Osteopenia 03/23/2015   S/P bariatric surgery 06/30/2013    Past Surgical History: Past Surgical History:  Procedure Laterality Date   BREAST BIOPSY Left 2014   CHOLECYSTECTOMY  2022   LAPAROSCOPIC GASTRIC SLEEVE RESECTION  2014   LUMBAR DISC SURGERY  2014/2016   PANNICULECTOMY N/A 07/04/2024   Procedure: PANNICULECTOMY;  Surgeon: Waddell Leonce NOVAK, MD;  Location: [facility];  Service: Plastics;  Laterality: N/A;    Social History: Social History   Socioeconomic History   Marital status: Married    Spouse name: Franky   Number of children: 2   Years of education: Not on file   Highest education level: Associate degree: academic program  Occupational History   Occupation: Nature conservation officer  Tobacco Use   Smoking status: Former    Current packs/day: 0.00    Average packs/day: 0.5 packs/day for 15.0 years (7.5 ttl pk-yrs)    Types: Cigarettes    Start date: 71    Quit date: 2006    Years since quitting: 19.9   Smokeless tobacco: Never  Vaping Use   Vaping status: Never Used  Substance and Sexual Activity   Alcohol use: Not Currently   Drug use: Never   Sexual activity: Yes    Partners: Male    Birth control/protection: I.U.D.  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    Social Drivers of Health   Tobacco Use: Medium Risk (09/06/2024)   Patient History    Smoking Tobacco Use: Former    Smokeless Tobacco Use: Never    Passive Exposure: Not on file  Financial  Resource Strain: Low Risk (09/01/2024)   Overall Financial Resource Strain (CARDIA)    Difficulty of Paying Living Expenses: Not hard at all  Food Insecurity: No Food Insecurity (09/01/2024)   Epic    Worried About Programme Researcher, Broadcasting/film/video in the Last Year: Never true    Ran Out of Food in the Last Year: Never true  Transportation Needs: No Transportation Needs (09/01/2024)   Epic    Lack of Transportation (Medical): No    Lack of Transportation (Non-Medical): No  Physical Activity: Inactive (09/01/2024)   Exercise Vital Sign    Days of Exercise per Week: 0 days    Minutes of Exercise per  Session: Not on file  Stress: Stress Concern Present (09/01/2024)   [employer] of Occupational Health - Occupational Stress Questionnaire    Feeling of Stress: Rather much  Social Connections: Socially Integrated (09/01/2024)   Social Connection and Isolation Panel    Frequency of Communication with Friends and Family: More than three times a week    Frequency of Social Gatherings with Friends and Family: Once a week    Attends Religious Services: More than 4 times per year    Active Member of [employer] or Organizations: Yes    Attends Banker Meetings: More than 4 times per year    Marital Status: Married  Catering Manager Violence: Not At Risk (03/22/2024)   Humiliation, Afraid, Rape, and Kick questionnaire    Fear of Current or Ex-Partner: No    Emotionally Abused: No    Physically Abused: No    Sexually Abused: No  Depression (PHQ2-9): High Risk (09/02/2024)   Depression (PHQ2-9)    PHQ-2 Score: 17  Alcohol Screen: Low Risk (09/01/2024)   Alcohol Screen    Last Alcohol Screening Score (AUDIT): 6  Housing: Low Risk (09/01/2024)   Epic    Unable to Pay for Housing in the Last Year: No    Number of Times Moved in the Last Year: 0    Homeless in the Last Year: No  Utilities: Not At Risk (03/22/2024)   AHC Utilities    Threatened with loss of utilities: No  Health Literacy: Adequate Health Literacy (03/22/2024)   B1300 Health Literacy    Frequency of need for help with medical instructions: Never    Family History: Family History  Problem Relation Age of Onset   Arthritis Mother    Early death Mother    Mental illness Mother    Alcohol abuse Father    Arthritis Father    Thyroid  cancer Sister        papillary thyroid  cancer   Hypertension Sister    Uterine cancer Sister    Hypertension Sister    Cervical cancer Sister    Depression Sister    Hypertension Sister    Heart attack Maternal Grandfather    Colon cancer Neg Hx    Esophageal cancer Neg Hx     Breast cancer Neg Hx    Rectal cancer Neg Hx    Stomach cancer Neg Hx     Review of Systems: ROS  Physical Exam: Vital Signs LMP 01/12/2019   Physical Exam *** Constitutional:      General: Not in acute distress.    Appearance: Normal appearance. Not ill-appearing.  HENT:     Head: Normocephalic and atraumatic.  Eyes:     Pupils: Pupils are equal, round. Cardiovascular:     Rate and Rhythm: Normal rate.    Pulses: Normal pulses.  Pulmonary:     Effort: No respiratory distress or increased work of breathing.  Speaks in full sentences. Abdominal:     General: Abdomen is flat. No distension.   Musculoskeletal: Normal range of motion. No lower extremity swelling or edema. No varicosities. *** Skin:    General: Skin is warm and dry.     Findings: No erythema or rash.  Neurological:     Mental Status: Alert and oriented to person, place, and time.  Psychiatric:        Mood and Affect: Mood normal.        Behavior: Behavior normal.    Assessment/Plan: The patient is scheduled for *** with Dr. Kelleen Lowery [place].  Risks, benefits, and alternatives of procedure discussed, questions answered and consent obtained.    Smoking Status: ***; Counseling Given? *** Last Mammogram: ***; Results: ***  Caprini Score: ***; Risk Factors include: ***, BMI *** 25, and length of planned surgery. Recommendation for mechanical *** pharmacological prophylaxis. Encourage early ambulation.   Pictures obtained: ***  Post-op Rx sent to pharmacy: ***  Patient was provided with the *** General Surgical Risk consent document and Pain Medication Agreement prior to their appointment.  They had adequate time to read through the risk consent documents and Pain Medication Agreement. We also discussed them in person together during this preop appointment. All of their questions were answered to their satisfaction.  Recommended calling if they have any further questions.  Risk  consent form and Pain Medication Agreement to be scanned into patient's chart.  ***   Electronically signed by: Honora Seip, PA-C 10/10/2024 11:51 AM    [1]  Allergies Allergen Reactions   Tolmetin Hives, Itching and Rash  [2]  Current Outpatient Medications:    buPROPion  (WELLBUTRIN  XL) 150 MG 24 hr tablet, TAKE 1 TABLET BY MOUTH EVERY DAY, Disp: 90 tablet, Rfl: 1   cyanocobalamin  (VITAMIN B12) 1000 MCG/ML injection, INJECT 1ML INTO MUSCLE ONCE WEEKLY FOR 4 WEEKS, THEN 1 ML ONCE MONTHLY, Disp: 6 mL, Rfl: 1   DULoxetine  (CYMBALTA ) 60 MG capsule, Take 1 capsule (60 mg total) by mouth daily., Disp: 90 capsule, Rfl: 0   folic acid  (FOLVITE ) 1 MG tablet, Take 1 tablet (1 mg total) by mouth daily., Disp: 30 tablet, Rfl: 11   hydrOXYzine  (VISTARIL ) 25 MG capsule, TAKE 1 CAPSULE (25 MG TOTAL) BY MOUTH EVERY 8 (EIGHT) HOURS AS NEEDED., Disp: 30 capsule, Rfl: 2   Menthol, Topical Analgesic, 4 % GEL, Apply 1 Application topically as needed., Disp: , Rfl:    NEEDLE, DISP, 25 G (B-D DISP NEEDLE 25GX1) 25G X 1 MISC, For B12 inj, Disp: 50 each, Rfl: 1   nystatin  powder, Apply twice daily as needed to skin fold, Disp: 60 g, Rfl: 1   rivaroxaban  (XARELTO ) 10 MG TABS tablet, Take 1 tablet (10 mg total) by mouth daily. Start taking 1 week prior to surgery. Stop 2 days before surgery and restart the day after for 1 month., Disp: 30 tablet, Rfl: 1   tirzepatide  (ZEPBOUND ) 2.5 MG/0.5ML Pen, Inject 2.5 mg into the skin once a week., Disp: 2 mL, Rfl: 1   valACYclovir  (VALTREX ) 500 MG tablet, Take 1 tablet (500 mg total) by mouth daily., Disp: 7 tablet, Rfl: 0

## 2024-10-10 NOTE — Progress Notes (Unsigned)
 Patient ID: Melissa Santiago, female    DOB: [birth year], 58 y.o.   MRN: [id number]  No chief complaint on file.   No diagnosis found.   History of Present Illness: Melissa Santiago is a 58 y.o.  female  with a history of macromastia.  She presents for preoperative evaluation for upcoming procedure, bilateral breast reduction, scheduled for 10/18/2024 with Dr. Waddell.  The patient {HAS HAS WNU:[id number]} had problems with anesthesia. ***  Summary of Previous Visit: Patient met with Dr. Waddell for consult 08/24/2024.  At that time, discussed breast reduction surgery.  Personal history of protein S deficiency.  She required that her Xarelto  be held 2 days prior to surgery and resumed shortly thereafter when she had panniculectomy earlier this year.  Plan is to do the same again for DVT prophylaxis.  Estimated tissue to be removed is 500 g per side.  STN 36 cm each breast.  Job: ***  PMH Significant for: Macromastia, protein S deficiency on Xarelto , weight loss on Zepbound , history of bariatric surgery and recent panniculectomy, chronic constipation.   Past Medical History: Allergies: Allergies[1]  Current Medications: Current Medications[2]  Past Medical Problems: Past Medical History:  Diagnosis Date   Acute lumbar radiculopathy 06/29/2013   Chest pain in adult 05/24/2018   Chronic constipation 05/24/2018   Gastroesophageal reflux disease 05/24/2018   GERD (gastroesophageal reflux disease)    Left sided sciatica 05/10/2013   Lumbar disc herniation 06/29/2013   Morbid obesity (HCC) 05/24/2018   Obesity    Osteopenia 03/23/2015   S/P bariatric surgery 06/30/2013    Past Surgical History: Past Surgical History:  Procedure Laterality Date   BREAST BIOPSY Left 2014   CHOLECYSTECTOMY  2022   LAPAROSCOPIC GASTRIC SLEEVE RESECTION  2014   LUMBAR DISC SURGERY  2014/2016   PANNICULECTOMY N/A 07/04/2024   Procedure: PANNICULECTOMY;  Surgeon: Waddell Leonce NOVAK, MD;  Location: [facility];  Service: Plastics;  Laterality: N/A;    Social History: Social History   Socioeconomic History   Marital status: Married    Spouse name: Franky   Number of children: 2   Years of education: Not on file   Highest education level: Associate degree: academic program  Occupational History   Occupation: Nature conservation officer  Tobacco Use   Smoking status: Former    Current packs/day: 0.00    Average packs/day: 0.5 packs/day for 15.0 years (7.5 ttl pk-yrs)    Types: Cigarettes    Start date: 71    Quit date: 2006    Years since quitting: 19.9   Smokeless tobacco: Never  Vaping Use   Vaping status: Never Used  Substance and Sexual Activity   Alcohol use: Not Currently   Drug use: Never   Sexual activity: Yes    Partners: Male    Birth control/protection: I.U.D.  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    Social Drivers of Health   Tobacco Use: Medium Risk (09/06/2024)   Patient History    Smoking Tobacco Use: Former    Smokeless Tobacco Use: Never    Passive Exposure: Not on file  Financial  Resource Strain: Low Risk (09/01/2024)   Overall Financial Resource Strain (CARDIA)    Difficulty of Paying Living Expenses: Not hard at all  Food Insecurity: No Food Insecurity (09/01/2024)   Epic    Worried About Programme Researcher, Broadcasting/film/video in the Last Year: Never true    Ran Out of Food in the Last Year: Never true  Transportation Needs: No Transportation Needs (09/01/2024)   Epic    Lack of Transportation (Medical): No    Lack of Transportation (Non-Medical): No  Physical Activity: Inactive (09/01/2024)   Exercise Vital Sign    Days of Exercise per Week: 0 days    Minutes of Exercise per  Session: Not on file  Stress: Stress Concern Present (09/01/2024)   [employer] of Occupational Health - Occupational Stress Questionnaire    Feeling of Stress: Rather much  Social Connections: Socially Integrated (09/01/2024)   Social Connection and Isolation Panel    Frequency of Communication with Friends and Family: More than three times a week    Frequency of Social Gatherings with Friends and Family: Once a week    Attends Religious Services: More than 4 times per year    Active Member of [employer] or Organizations: Yes    Attends Banker Meetings: More than 4 times per year    Marital Status: Married  Catering Manager Violence: Not At Risk (03/22/2024)   Humiliation, Afraid, Rape, and Kick questionnaire    Fear of Current or Ex-Partner: No    Emotionally Abused: No    Physically Abused: No    Sexually Abused: No  Depression (PHQ2-9): High Risk (09/02/2024)   Depression (PHQ2-9)    PHQ-2 Score: 17  Alcohol Screen: Low Risk (09/01/2024)   Alcohol Screen    Last Alcohol Screening Score (AUDIT): 6  Housing: Low Risk (09/01/2024)   Epic    Unable to Pay for Housing in the Last Year: No    Number of Times Moved in the Last Year: 0    Homeless in the Last Year: No  Utilities: Not At Risk (03/22/2024)   AHC Utilities    Threatened with loss of utilities: No  Health Literacy: Adequate Health Literacy (03/22/2024)   B1300 Health Literacy    Frequency of need for help with medical instructions: Never    Family History: Family History  Problem Relation Age of Onset   Arthritis Mother    Early death Mother    Mental illness Mother    Alcohol abuse Father    Arthritis Father    Thyroid  cancer Sister        papillary thyroid  cancer   Hypertension Sister    Uterine cancer Sister    Hypertension Sister    Cervical cancer Sister    Depression Sister    Hypertension Sister    Heart attack Maternal Grandfather    Colon cancer Neg Hx    Esophageal cancer Neg Hx     Breast cancer Neg Hx    Rectal cancer Neg Hx    Stomach cancer Neg Hx     Review of Systems: ROS  Physical Exam: Vital Signs LMP 01/12/2019   Physical Exam *** Constitutional:      General: Not in acute distress.    Appearance: Normal appearance. Not ill-appearing.  HENT:     Head: Normocephalic and atraumatic.  Eyes:     Pupils: Pupils are equal, round. Cardiovascular:     Rate and Rhythm: Normal rate.    Pulses: Normal pulses.  Pulmonary:     Effort: No respiratory distress or increased work of breathing.  Speaks in full sentences. Abdominal:     General: Abdomen is flat. No distension.   Musculoskeletal: Normal range of motion. No lower extremity swelling or edema. No varicosities. *** Skin:    General: Skin is warm and dry.     Findings: No erythema or rash.  Neurological:     Mental Status: Alert and oriented to person, place, and time.  Psychiatric:        Mood and Affect: Mood normal.        Behavior: Behavior normal.    Assessment/Plan: The patient is scheduled for *** with Dr. Kelleen Lowery [place].  Risks, benefits, and alternatives of procedure discussed, questions answered and consent obtained.    Smoking Status: ***; Counseling Given? *** Last Mammogram: ***; Results: ***  Caprini Score: ***; Risk Factors include: ***, BMI *** 25, and length of planned surgery. Recommendation for mechanical *** pharmacological prophylaxis. Encourage early ambulation.   Pictures obtained: ***  Post-op Rx sent to pharmacy: ***  Patient was provided with the *** General Surgical Risk consent document and Pain Medication Agreement prior to their appointment.  They had adequate time to read through the risk consent documents and Pain Medication Agreement. We also discussed them in person together during this preop appointment. All of their questions were answered to their satisfaction.  Recommended calling if they have any further questions.  Risk  consent form and Pain Medication Agreement to be scanned into patient's chart.  ***   Electronically signed by: Honora Seip, PA-C 10/10/2024 11:51 AM    [1]  Allergies Allergen Reactions   Tolmetin Hives, Itching and Rash  [2]  Current Outpatient Medications:    buPROPion  (WELLBUTRIN  XL) 150 MG 24 hr tablet, TAKE 1 TABLET BY MOUTH EVERY DAY, Disp: 90 tablet, Rfl: 1   cyanocobalamin  (VITAMIN B12) 1000 MCG/ML injection, INJECT 1ML INTO MUSCLE ONCE WEEKLY FOR 4 WEEKS, THEN 1 ML ONCE MONTHLY, Disp: 6 mL, Rfl: 1   DULoxetine  (CYMBALTA ) 60 MG capsule, Take 1 capsule (60 mg total) by mouth daily., Disp: 90 capsule, Rfl: 0   folic acid  (FOLVITE ) 1 MG tablet, Take 1 tablet (1 mg total) by mouth daily., Disp: 30 tablet, Rfl: 11   hydrOXYzine  (VISTARIL ) 25 MG capsule, TAKE 1 CAPSULE (25 MG TOTAL) BY MOUTH EVERY 8 (EIGHT) HOURS AS NEEDED., Disp: 30 capsule, Rfl: 2   Menthol, Topical Analgesic, 4 % GEL, Apply 1 Application topically as needed., Disp: , Rfl:    NEEDLE, DISP, 25 G (B-D DISP NEEDLE 25GX1) 25G X 1 MISC, For B12 inj, Disp: 50 each, Rfl: 1   nystatin  powder, Apply twice daily as needed to skin fold, Disp: 60 g, Rfl: 1   rivaroxaban  (XARELTO ) 10 MG TABS tablet, Take 1 tablet (10 mg total) by mouth daily. Start taking 1 week prior to surgery. Stop 2 days before surgery and restart the day after for 1 month., Disp: 30 tablet, Rfl: 1   tirzepatide  (ZEPBOUND ) 2.5 MG/0.5ML Pen, Inject 2.5 mg into the skin once a week., Disp: 2 mL, Rfl: 1   valACYclovir  (VALTREX ) 500 MG tablet, Take 1 tablet (500 mg total) by mouth daily., Disp: 7 tablet, Rfl: 0

## 2024-10-11 ENCOUNTER — Other Ambulatory Visit: Payer: Self-pay

## 2024-10-11 ENCOUNTER — Encounter (HOSPITAL_BASED_OUTPATIENT_CLINIC_OR_DEPARTMENT_OTHER): Payer: Self-pay | Admitting: Plastic Surgery

## 2024-10-11 ENCOUNTER — Ambulatory Visit: Admitting: Physician Assistant

## 2024-10-11 VITALS — BP 98/69 | HR 74

## 2024-10-11 DIAGNOSIS — N62 Hypertrophy of breast: Secondary | ICD-10-CM | POA: Diagnosis not present

## 2024-10-11 MED ORDER — OXYCODONE HCL 5 MG PO TABS: 5.0000 mg | ORAL_TABLET | Freq: Three times a day (TID) | ORAL | 0 refills | Status: AC | PRN

## 2024-10-13 ENCOUNTER — Other Ambulatory Visit (HOSPITAL_COMMUNITY): Payer: Self-pay

## 2024-10-18 ENCOUNTER — Encounter (HOSPITAL_BASED_OUTPATIENT_CLINIC_OR_DEPARTMENT_OTHER): Payer: Self-pay | Admitting: Plastic Surgery

## 2024-10-18 ENCOUNTER — Ambulatory Visit (HOSPITAL_BASED_OUTPATIENT_CLINIC_OR_DEPARTMENT_OTHER): Admitting: Anesthesiology

## 2024-10-18 ENCOUNTER — Ambulatory Visit (HOSPITAL_BASED_OUTPATIENT_CLINIC_OR_DEPARTMENT_OTHER)
Admission: RE | Admit: 2024-10-18 | Discharge: 2024-10-18 | Disposition: A | Discharge status: Home / Self Care | Attending: Plastic Surgery | Admitting: Plastic Surgery

## 2024-10-18 ENCOUNTER — Other Ambulatory Visit: Payer: Self-pay

## 2024-10-18 ENCOUNTER — Encounter (HOSPITAL_BASED_OUTPATIENT_CLINIC_OR_DEPARTMENT_OTHER): Admission: RE | Payer: Self-pay

## 2024-10-18 DIAGNOSIS — M546 Pain in thoracic spine: Secondary | ICD-10-CM

## 2024-10-18 DIAGNOSIS — G4730 Sleep apnea, unspecified: Secondary | ICD-10-CM | POA: Insufficient documentation

## 2024-10-18 DIAGNOSIS — M542 Cervicalgia: Secondary | ICD-10-CM | POA: Diagnosis not present

## 2024-10-18 DIAGNOSIS — N6031 Fibrosclerosis of right breast: Secondary | ICD-10-CM | POA: Diagnosis not present

## 2024-10-18 DIAGNOSIS — Z87891 Personal history of nicotine dependence: Secondary | ICD-10-CM | POA: Diagnosis not present

## 2024-10-18 DIAGNOSIS — N62 Hypertrophy of breast: Secondary | ICD-10-CM | POA: Insufficient documentation

## 2024-10-18 DIAGNOSIS — N6032 Fibrosclerosis of left breast: Secondary | ICD-10-CM | POA: Insufficient documentation

## 2024-10-18 DIAGNOSIS — Z9884 Bariatric surgery status: Secondary | ICD-10-CM | POA: Diagnosis not present

## 2024-10-18 HISTORY — PX: BREAST REDUCTION SURGERY: SHX8

## 2024-10-18 SURGERY — MAMMOPLASTY, REDUCTION
Anesthesia: General | Site: Breast | Laterality: Bilateral

## 2024-10-18 MED ORDER — OXYCODONE HCL 5 MG PO TABS
ORAL_TABLET | ORAL | Status: AC
  Filled 2024-10-18: qty 1

## 2024-10-18 MED ORDER — DEXAMETHASONE SOD PHOSPHATE PF 10 MG/ML IJ SOLN
INTRAMUSCULAR | Status: DC | PRN
  Administered 2024-10-18: 10 mg via INTRAVENOUS

## 2024-10-18 MED ORDER — BUPIVACAINE HCL (PF) 0.25 % IJ SOLN
INTRAMUSCULAR | Status: AC
  Filled 2024-10-18: qty 30

## 2024-10-18 MED ORDER — AMISULPRIDE (ANTIEMETIC) 5 MG/2ML IV SOLN
INTRAVENOUS | Status: AC
  Filled 2024-10-18: qty 4

## 2024-10-18 MED ORDER — CHLORHEXIDINE GLUCONATE CLOTH 2 % EX PADS: 6.0000 | MEDICATED_PAD | Freq: Once | CUTANEOUS | Status: DC

## 2024-10-18 MED ORDER — FENTANYL CITRATE (PF) 100 MCG/2ML IJ SOLN
INTRAMUSCULAR | Status: AC
  Filled 2024-10-18: qty 2

## 2024-10-18 MED ORDER — ONDANSETRON HCL 4 MG/2ML IJ SOLN
INTRAMUSCULAR | Status: AC
  Filled 2024-10-18: qty 2

## 2024-10-18 MED ORDER — ROCURONIUM BROMIDE 10 MG/ML (PF) SYRINGE
PREFILLED_SYRINGE | INTRAVENOUS | Status: AC
  Filled 2024-10-18: qty 10

## 2024-10-18 MED ORDER — FENTANYL CITRATE (PF) 100 MCG/2ML IJ SOLN
INTRAMUSCULAR | Status: DC | PRN
  Administered 2024-10-18 (×4): 50 ug via INTRAVENOUS

## 2024-10-18 MED ORDER — MIDAZOLAM HCL 2 MG/2ML IJ SOLN
INTRAMUSCULAR | Status: AC
  Filled 2024-10-18: qty 2

## 2024-10-18 MED ORDER — PROPOFOL 10 MG/ML IV BOLUS
INTRAVENOUS | Status: AC
  Filled 2024-10-18: qty 20

## 2024-10-18 MED ORDER — LACTATED RINGERS IV SOLN: INTRAVENOUS | Status: DC

## 2024-10-18 MED ORDER — ACETAMINOPHEN 500 MG PO TABS
1000.0000 mg | ORAL_TABLET | Freq: Once | ORAL | Status: AC
  Administered 2024-10-18: 1000 mg via ORAL

## 2024-10-18 MED ORDER — LIDOCAINE 2% (20 MG/ML) 5 ML SYRINGE
INTRAMUSCULAR | Status: AC
  Filled 2024-10-18: qty 5

## 2024-10-18 MED ORDER — 0.9 % SODIUM CHLORIDE (POUR BTL) OPTIME
TOPICAL | Status: DC | PRN
  Administered 2024-10-18: 1000 mL

## 2024-10-18 MED ORDER — EPHEDRINE SULFATE-NACL 50-0.9 MG/10ML-% IV SOSY
PREFILLED_SYRINGE | INTRAVENOUS | Status: DC | PRN
  Administered 2024-10-18 (×5): 5 mg via INTRAVENOUS

## 2024-10-18 MED ORDER — EPHEDRINE 5 MG/ML INJ
INTRAVENOUS | Status: AC
  Filled 2024-10-18: qty 5

## 2024-10-18 MED ORDER — ROCURONIUM BROMIDE 10 MG/ML (PF) SYRINGE
PREFILLED_SYRINGE | INTRAVENOUS | Status: DC | PRN
  Administered 2024-10-18 (×2): 10 mg via INTRAVENOUS
  Administered 2024-10-18: 60 mg via INTRAVENOUS

## 2024-10-18 MED ORDER — TRANEXAMIC ACID 1000 MG/10ML IV SOLN
INTRAVENOUS | Status: DC | PRN
  Administered 2024-10-18: 3000 mg via TOPICAL

## 2024-10-18 MED ORDER — BUPIVACAINE LIPOSOME 1.3 % IJ SUSP
INTRAMUSCULAR | Status: AC
  Filled 2024-10-18: qty 20

## 2024-10-18 MED ORDER — FENTANYL CITRATE (PF) 100 MCG/2ML IJ SOLN: 25.0000 ug | INTRAMUSCULAR | Status: DC | PRN

## 2024-10-18 MED ORDER — TRANEXAMIC ACID 1000 MG/10ML IV SOLN
INTRAVENOUS | Status: AC
  Filled 2024-10-18: qty 30

## 2024-10-18 MED ORDER — SODIUM CHLORIDE (PF) 0.9 % IJ SOLN
INTRAMUSCULAR | Status: DC | PRN
  Administered 2024-10-18: 100 mL

## 2024-10-18 MED ORDER — ONDANSETRON HCL 4 MG/2ML IJ SOLN
INTRAMUSCULAR | Status: DC | PRN
  Administered 2024-10-18: 4 mg via INTRAVENOUS

## 2024-10-18 MED ORDER — OXYCODONE HCL 5 MG/5ML PO SOLN: 5.0000 mg | Freq: Once | ORAL | Status: AC | PRN

## 2024-10-18 MED ORDER — CEFAZOLIN SODIUM-DEXTROSE 2-4 GM/100ML-% IV SOLN
2.0000 g | INTRAVENOUS | Status: AC
  Administered 2024-10-18: 2 g via INTRAVENOUS

## 2024-10-18 MED ORDER — SODIUM CHLORIDE (PF) 0.9 % IJ SOLN
INTRAMUSCULAR | Status: AC
  Filled 2024-10-18: qty 50

## 2024-10-18 MED ORDER — CEFAZOLIN SODIUM-DEXTROSE 2-4 GM/100ML-% IV SOLN
INTRAVENOUS | Status: AC
  Filled 2024-10-18: qty 100

## 2024-10-18 MED ORDER — MIDAZOLAM HCL 5 MG/5ML IJ SOLN
INTRAMUSCULAR | Status: DC | PRN
  Administered 2024-10-18: 2 mg via INTRAVENOUS

## 2024-10-18 MED ORDER — SUGAMMADEX SODIUM 200 MG/2ML IV SOLN
INTRAVENOUS | Status: DC | PRN
  Administered 2024-10-18: 175 mg via INTRAVENOUS

## 2024-10-18 MED ORDER — PROPOFOL 10 MG/ML IV BOLUS
INTRAVENOUS | Status: DC | PRN
  Administered 2024-10-18: 150 mg via INTRAVENOUS

## 2024-10-18 MED ORDER — OXYCODONE HCL 5 MG PO TABS
5.0000 mg | ORAL_TABLET | Freq: Once | ORAL | Status: AC | PRN
  Administered 2024-10-18: 5 mg via ORAL

## 2024-10-18 MED ORDER — DEXMEDETOMIDINE HCL IN NACL 80 MCG/20ML IV SOLN
INTRAVENOUS | Status: DC | PRN
  Administered 2024-10-18 (×3): 4 ug via INTRAVENOUS

## 2024-10-18 MED ORDER — ACETAMINOPHEN 500 MG PO TABS
ORAL_TABLET | ORAL | Status: AC
  Filled 2024-10-18: qty 2

## 2024-10-18 MED ORDER — LIDOCAINE HCL (PF) 2 % IJ SOLN
INTRAMUSCULAR | Status: DC | PRN
  Administered 2024-10-18: 60 mg via INTRADERMAL

## 2024-10-18 MED ORDER — AMISULPRIDE (ANTIEMETIC) 5 MG/2ML IV SOLN
10.0000 mg | Freq: Once | INTRAVENOUS | Status: AC | PRN
  Administered 2024-10-18: 10 mg via INTRAVENOUS

## 2024-10-18 SURGICAL SUPPLY — 58 items
BINDER BREAST 3XL (GAUZE/BANDAGES/DRESSINGS) IMPLANT
BINDER BREAST LRG (GAUZE/BANDAGES/DRESSINGS) IMPLANT
BINDER BREAST MEDIUM (GAUZE/BANDAGES/DRESSINGS) IMPLANT
BINDER BREAST XLRG (GAUZE/BANDAGES/DRESSINGS) IMPLANT
BINDER BREAST XXLRG (GAUZE/BANDAGES/DRESSINGS) IMPLANT
BIOPATCH RED 1 DISK 7.0 (GAUZE/BANDAGES/DRESSINGS) ×2 IMPLANT
BLADE HEX COATED 2.75 (ELECTRODE) IMPLANT
BLADE KNIFE PERSONA 10 (BLADE) ×6 IMPLANT
BLADE SURG 15 STRL LF DISP TIS (BLADE) ×1 IMPLANT
CANISTER SUCT 1200ML W/VALVE (MISCELLANEOUS) IMPLANT
COTTONBALL LRG STERILE PKG (GAUZE/BANDAGES/DRESSINGS) IMPLANT
DERMABOND ADVANCED .7 DNX12 (GAUZE/BANDAGES/DRESSINGS) ×4 IMPLANT
DRAIN CHANNEL 19F RND (DRAIN) ×2 IMPLANT
DRAPE IMP U-DRAPE 54X76 (DRAPES) IMPLANT
DRAPE UTILITY XL STRL (DRAPES) ×1 IMPLANT
DRSG TEGADERM 4X4.75 (GAUZE/BANDAGES/DRESSINGS) ×2 IMPLANT
ELECTRODE BLDE 4.0 EZ CLN MEGD (MISCELLANEOUS) ×1 IMPLANT
ELECTRODE REM PT RTRN 9FT ADLT (ELECTROSURGICAL) ×2 IMPLANT
EVACUATOR SILICONE 100CC (DRAIN) ×2 IMPLANT
GAUZE PAD ABD 8X10 STRL (GAUZE/BANDAGES/DRESSINGS) ×2 IMPLANT
GAUZE SPONGE 2X2 STRL 8-PLY (GAUZE/BANDAGES/DRESSINGS) ×2 IMPLANT
GAUZE XEROFORM 5X9 LF (GAUZE/BANDAGES/DRESSINGS) IMPLANT
GLOVE BIO SURGEON STRL SZ 6.5 (GLOVE) IMPLANT
GLOVE BIO SURGEON STRL SZ7.5 (GLOVE) IMPLANT
GLOVE BIO SURGEON STRL SZ8 (GLOVE) ×1 IMPLANT
GLOVE BIOGEL PI IND STRL 7.0 (GLOVE) IMPLANT
GLOVE BIOGEL PI IND STRL 8 (GLOVE) ×1 IMPLANT
GOWN STRL REUS W/ TWL LRG LVL3 (GOWN DISPOSABLE) ×1 IMPLANT
GOWN STRL REUS W/TWL XL LVL3 (GOWN DISPOSABLE) ×1 IMPLANT
HIBICLENS CHG 4% 4OZ BTL (MISCELLANEOUS) ×1 IMPLANT
MANIFOLD NEPTUNE II (INSTRUMENTS) ×1 IMPLANT
MARKER SKIN DUAL TIP RULER LAB (MISCELLANEOUS) ×1 IMPLANT
NDL HYPO 22X1.5 SAFETY MO (MISCELLANEOUS) ×2 IMPLANT
NEEDLE HYPO 22X1.5 SAFETY MO (MISCELLANEOUS) ×2 IMPLANT
PACK BASIN DAY SURGERY FS (CUSTOM PROCEDURE TRAY) ×1 IMPLANT
PACK UNIVERSAL I (CUSTOM PROCEDURE TRAY) ×1 IMPLANT
PENCIL SMOKE EVACUATOR (MISCELLANEOUS) ×2 IMPLANT
PIN SAFETY STERILE (MISCELLANEOUS) ×1 IMPLANT
SLEEVE SCD COMPRESS KNEE MED (STOCKING) ×1 IMPLANT
SOLN 0.9% NACL POUR BTL 1000ML (IV SOLUTION) ×1 IMPLANT
SPONGE T-LAP 18X18 ~~LOC~~+RFID (SPONGE) ×3 IMPLANT
STAPLER SKIN PROX WIDE 3.9 (STAPLE) ×1 IMPLANT
STRIP CLOSURE SKIN 1/2X4 (GAUZE/BANDAGES/DRESSINGS) IMPLANT
SUT MNCRL AB 3-0 PS2 27 (SUTURE) ×4 IMPLANT
SUT MNCRL AB 4-0 PS2 18 (SUTURE) ×4 IMPLANT
SUT MON AB 2-0 CT1 36 (SUTURE) ×1 IMPLANT
SUT MON AB 5-0 PS2 18 (SUTURE) IMPLANT
SUT SILK 2 0 SH (SUTURE) ×2 IMPLANT
SUT VIC AB 3-0 SH 27X BRD (SUTURE) IMPLANT
SYR 20ML LL LF (SYRINGE) ×2 IMPLANT
SYR BULB IRRIG 60ML STRL (SYRINGE) IMPLANT
SYR CONTROL 10ML LL (SYRINGE) ×1 IMPLANT
TAPE MEASURE VINYL STERILE (MISCELLANEOUS) IMPLANT
TOWEL GREEN STERILE FF (TOWEL DISPOSABLE) ×2 IMPLANT
TRAY DSU PREP LF (CUSTOM PROCEDURE TRAY) ×1 IMPLANT
TUBE CONNECTING 20X1/4 (TUBING) ×1 IMPLANT
UNDERPAD 30X36 HEAVY ABSORB (UNDERPADS AND DIAPERS) ×2 IMPLANT
YANKAUER SUCT BULB TIP NO VENT (SUCTIONS) ×1 IMPLANT

## 2024-10-18 NOTE — Discharge Instructions (Addendum)
 Activity: Avoid strenuous activity.  No lifting, pushing, or pulling greater than 15 pounds.  Diet: No restrictions.  Try to optimize nutrition with plenty of proteins, fruits, and vegetables to improve healing.   Wound Care: Leave breast binder on for the first week and then you may transition to a front-clipping or front-zipping compression bra.  Sponge bathe only until our visit tomorrow, then we can discuss transition to showering with the emphasis on keeping the surgical site dry.  Replace the ABD pads over incisions with gauze or Maxi pads as needed for incisional drainage.   If you have drains placed, be sure to record the daily output from each side. They will be removed at your appointment tomorrow. Please make sure that the bulbs are charged.  If you experience any issues, please call the office.    Follow-Up: Scheduled for tomorrow.  Things to watch for:  Call the office if you experience fever, chills, intractable vomiting, or significant bleeding.  Mild wound drainage is common after breast reduction surgery and should not be cause for alarm.      May take Tylenol  after 2pm, if needed.    Post Anesthesia Home Care Instructions  Activity: Get plenty of rest for the remainder of the day. A responsible individual must stay with you for 24 hours following the procedure.  For the next 24 hours, DO NOT: -Drive a car -Advertising copywriter -Drink alcoholic beverages -Take any medication unless instructed by your physician -Make any legal decisions or sign important papers.  Meals: Start with liquid foods such as gelatin or soup. Progress to regular foods as tolerated. Avoid greasy, spicy, heavy foods. If nausea and/or vomiting occur, drink only clear liquids until the nausea and/or vomiting subsides. Call your physician if vomiting continues.  Special Instructions/Symptoms: Your throat may feel dry or sore from the anesthesia or the breathing tube placed in your throat during  surgery. If this causes discomfort, gargle with warm salt water. The discomfort should disappear within 24 hours.  If you had a scopolamine patch placed behind your ear for the management of post- operative nausea and/or vomiting:  1. The medication in the patch is effective for 72 hours, after which it should be removed.  Wrap patch in a tissue and discard in the trash. Wash hands thoroughly with soap and water. 2. You may remove the patch earlier than 72 hours if you experience unpleasant side effects which may include dry mouth, dizziness or visual disturbances. 3. Avoid touching the patch. Wash your hands with soap and water after contact with the patch.     Information for Discharge Teaching: EXPAREL  (bupivacaine  liposome injectable suspension)   Pain relief is important to your recovery. The goal is to control your pain so you can move easier and return to your normal activities as soon as possible after your procedure. Your physician may use several types of medicines to manage pain, swelling, and more.  Your surgeon or anesthesiologist gave you EXPAREL (bupivacaine ) to help control your pain after surgery.  EXPAREL  is a local anesthetic designed to release slowly over an extended period of time to provide pain relief by numbing the tissue around the surgical site. EXPAREL  is designed to release pain medication over time and can control pain for up to 72 hours. Depending on how you respond to EXPAREL , you may require less pain medication during your recovery. EXPAREL  can help reduce or eliminate the need for opioids during the first few days after surgery when pain relief  is needed the most. EXPAREL  is not an opioid and is not addictive. It does not cause sleepiness or sedation.   Important! A teal colored band has been placed on your arm with the date, time and amount of EXPAREL  you have received. Please leave this armband in place for the full 96 hours following administration, and then  you may remove the band. If you return to the hospital for any reason within 96 hours following the administration of EXPAREL , the armband provides important information that your health care providers to know, and alerts them that you have received this anesthetic.    Possible side effects of EXPAREL : Temporary loss of sensation or ability to move in the area where medication was injected. Nausea, vomiting, constipation Rarely, numbness and tingling in your mouth or lips, lightheadedness, or anxiety may occur. Call your doctor right away if you think you may be experiencing any of these sensations, or if you have other questions regarding possible side effects.  Follow all other discharge instructions given to you by your surgeon or nurse. Eat a healthy diet and drink plenty of water or other fluids.  About my Jackson-Pratt Bulb Drain  What is a Jackson-Pratt bulb? A Jackson-Pratt is a soft, round device used to collect drainage. It is connected to a long, thin drainage catheter, which is held in place by one or two small stiches near your surgical incision site. When the bulb is squeezed, it forms a vacuum, forcing the drainage to empty into the bulb.  Emptying the Jackson-Pratt bulb- To empty the bulb: 1. Release the plug on the top of the bulb. 2. Pour the bulb's contents into a measuring container which your nurse will provide. 3. Record the time emptied and amount of drainage. Empty the drain(s) as often as your     doctor or nurse recommends.  Date                  Time                    Amount (Drain 1)                 Amount (Drain  2)  _____________________________________________________________________  _____________________________________________________________________  _____________________________________________________________________  _____________________________________________________________________  _____________________________________________________________________  _____________________________________________________________________  _____________________________________________________________________  _____________________________________________________________________  Squeezing the Jackson-Pratt Bulb- To squeeze the bulb: 1. Make sure the plug at the top of the bulb is open. 2. Squeeze the bulb tightly in your fist. You will hear air squeezing from the bulb. 3. Replace the plug while the bulb is squeezed. 4. Use a safety pin to attach the bulb to your clothing. This will keep the catheter from     pulling at the bulb insertion site.  When to call your doctor- Call your doctor if: Drain site becomes red, swollen or hot. You have a fever greater than 101 degrees F. There is oozing at the drain site. Drain falls out (apply a guaze bandage over the drain hole and secure it with tape). Drainage increases daily not related to activity patterns. (You will usually have more drainage when you are active than when you are resting.) Drainage has a bad odor.

## 2024-10-18 NOTE — Transfer of Care (Signed)
 Immediate Anesthesia Transfer of Care Note  Patient: Melissa Santiago  Procedure(s) Performed: MAMMOPLASTY, REDUCTION (Bilateral: Breast)  Patient Location: PACU  Anesthesia Type:General  Level of Consciousness: awake, alert , and oriented  Airway & Oxygen Therapy: Patient Spontanous Breathing and Patient connected to face mask oxygen  Post-op Assessment: Report given to RN and Post -op Vital signs reviewed and stable  Post vital signs: Reviewed and stable  Last Vitals:  Vitals Value Taken Time  BP    Temp    Pulse 82 10/18/24 13:08  Resp 9 10/18/24 13:08  SpO2 99 % 10/18/24 13:08  Vitals shown include unfiled device data.  Last Pain:  Vitals:   10/18/24 0757  TempSrc: Tympanic         Complications: No notable events documented.

## 2024-10-18 NOTE — Anesthesia Postprocedure Evaluation (Signed)
"   Anesthesia Post Note  Patient: Loann Chahal Vandekamp  Procedure(s) Performed: MAMMOPLASTY, REDUCTION (Bilateral: Breast)     Patient location during evaluation: PACU Anesthesia Type: General Level of consciousness: awake and alert Pain management: pain level controlled Vital Signs Assessment: post-procedure vital signs reviewed and stable Respiratory status: spontaneous breathing, nonlabored ventilation, respiratory function stable and patient connected to nasal cannula oxygen Cardiovascular status: blood pressure returned to baseline and stable Postop Assessment: no apparent nausea or vomiting Anesthetic complications: no   No notable events documented.  Last Vitals:  Vitals:   10/18/24 1330 10/18/24 1349  BP: (!) 94/57 97/65  Pulse: 83 80  Resp: 19 16  Temp:  36.6 C  SpO2: 97% 97%    Last Pain:  Vitals:   10/18/24 0757  TempSrc: Tympanic                 Thom JONELLE Peoples      "

## 2024-10-18 NOTE — Interval H&P Note (Signed)
 History and Physical Interval Note: No change in exam or indication for surgery All questions answered Marked for a bilateral breast reduction with her assistance Will proceed at her request  10/18/2024 8:56 AM  Melissa Santiago  has presented today for surgery, with the diagnosis of [id number].  The various methods of treatment have been discussed with the patient and family. After consideration of risks, benefits and other options for treatment, the patient has consented to  Procedures: MAMMOPLASTY, REDUCTION (Bilateral) as a surgical intervention.  The patient's history has been reviewed, patient examined, no change in status, stable for surgery.  I have reviewed the patient's chart and labs.  Questions were answered to the patient's satisfaction.     Leonce KATHEE [place]

## 2024-10-18 NOTE — Anesthesia Preprocedure Evaluation (Addendum)
"                                    Anesthesia Evaluation  Patient identified by MRN, date of birth, ID band Patient awake    Reviewed: Allergy & Precautions, NPO status , Patient's Chart, lab work & pertinent test results  Airway Mallampati: II  TM Distance: >3 FB Neck ROM: Full    Dental no notable dental hx.    Pulmonary sleep apnea and Continuous Positive Airway Pressure Ventilation , former smoker   Pulmonary exam normal        Cardiovascular negative cardio ROS Normal cardiovascular exam     Neuro/Psych  PSYCHIATRIC DISORDERS Anxiety Depression     Neuromuscular disease    GI/Hepatic Neg liver ROS,,,S/P bariatric surgery   Endo/Other  negative endocrine ROS    Renal/GU negative Renal ROS     Musculoskeletal negative musculoskeletal ROS (+)    Abdominal  (+) + obese  Peds  Hematology Protein S deficiency   Anesthesia Other Findings Macromastia   Reproductive/Obstetrics                              Anesthesia Physical Anesthesia Plan  ASA: 2  Anesthesia Plan: General   Post-op Pain Management:    Induction: Intravenous  PONV Risk Score and Plan: 3 and Ondansetron , Dexamethasone , Midazolam  and Treatment may vary due to age or medical condition  Airway Management Planned: Oral ETT  Additional Equipment:   Intra-op Plan:   Post-operative Plan: Extubation in OR  Informed Consent: I have reviewed the patients History and Physical, chart, labs and discussed the procedure including the risks, benefits and alternatives for the proposed anesthesia with the patient or authorized representative who has indicated his/her understanding and acceptance.     Dental advisory given  Plan Discussed with: CRNA  Anesthesia Plan Comments:          Anesthesia Quick Evaluation  "

## 2024-10-18 NOTE — Anesthesia Procedure Notes (Signed)
 Procedure Name: Intubation Date/Time: 10/18/2024 9:27 AM  Performed by: Kiron Osmun D, CRNAPre-anesthesia Checklist: Patient identified, Emergency Drugs available, Suction available and Patient being monitored Patient Re-evaluated:Patient Re-evaluated prior to induction Oxygen Delivery Method: Circle system utilized Preoxygenation: Pre-oxygenation with 100% oxygen Induction Type: IV induction Ventilation: Mask ventilation without difficulty Laryngoscope Size: Mac and 3 Tube type: Oral Tube size: 7.0 mm Number of attempts: 1 Airway Equipment and Method: Stylet and Oral airway Placement Confirmation: ETT inserted through vocal cords under direct vision, positive ETCO2 and breath sounds checked- equal and bilateral Secured at: 21 cm Tube secured with: Tape Dental Injury: Teeth and Oropharynx as per pre-operative assessment

## 2024-10-18 NOTE — Op Note (Signed)
 DATE OF OPERATION: 10/18/2024   LOCATION: [facility] operating Room   PREOPERATIVE DIAGNOSIS: Symptomatic macromastia   POSTOPERATIVE DIAGNOSIS: Same   PROCEDURE: Bilateral breast reduction   SURGEON: Marinell Birmingham, MD   ASSISTANT: Honora Seip   EBL: 100 cc   CONDITION: Stable   COMPLICATIONS: None   INDICATION: The patient, Melissa Santiago, is a 58 y.o. female born on Apr 14, 1966, is here for treatment upper back and neck pain secondary to large breast size.     PROCEDURE DETAILS:  The patient was seen prior to surgery and marked.  IV antibiotics were given. The patient was taken to the operating room,SCDs were placed, and given a general anesthetic. A standard time out was performed and all information was confirmed by those in the room.    The chest was prepped and draped in usual sterile manner.  A 42 mm cookie cutter was used to outline the proposed nipple areolar complexes bilaterally and an 8 cm based inferior pedicle was outlined on each breast.  The right breast was addressed first.  Laparotomy tape was placed at the base of breast as a tourniquet and the pedicle was de-epithelialized sharply.  Electrocautery was used to dissect the borders of the pedicle to the chest wall.  Electrocautery was then used to resect the medial lateral and superior triangles of breast tissue and to develop within the superior skin flap.  The breast tissue removed constituted the bulk of the reduction and weighed 502 gms.  The subfascial space over the pectoralis muscle and the subcutaneous tissues were infiltrated with 50 mL of a mixture of Exparel , quarter percent plain Marcaine , and saline.  The surgical wound was irrigated and hemostasis ensured with electrocautery.  A 19 French round drain was placed behind the pedicle and brought out through a separate stab incision.  The T point was approximated with a single 2-0 Monocryl suture and the skin edges were tailor tacked in place with skin  clips.  Dermis was approximated with interrupted and running 3-0 Monocryl sutures and the skin was closed with a running 4-0 Monocryl subcuticular stitch.  Attention was turned to the left breast where similar procedure was performed.  After placing a laparotomy tape at the base of the breast as a tourniquet the pedicle was de-epithelialized sharply and dissected to the chest wall with electrocautery.  Medial lateral and superior triangles of breast tissue were resected and the superior skin flap was developed and thinned.  The breast tissue removed constituted the bulk of the reduction and weighed 546 gms.  All breast tissue was sent to pathology for routine examination.  Again the subfascial space was infiltrated with the Exparel  mixture and the surgical bed irrigated.  After ensuring hemostasis a 19 French round drain was placed behind the pedicle and brought out through a separate stab incision. The T point was approximated with a 2-0 Monocryl suture and the skin edges were tailor tacked in place with skin clips.  Dermis was closed with interrupted and running 3-0 Monocryl sutures and skin was closed with running 4-0 Monocryl subcuticular stitch.  Prior to closing each breast the surfaces were covered with a laparotomy tape soaked with TXA and allowed to dwell for 5 minutes to ensure hemostasis due to her need for anticoagulation later this week.  All incisions were then sealed with Dermabond the patient was placed in a supportive, compressive garment.  She was awakened from anesthesia without incident and transferred to the recovery room in good  condition.  All instrument needle and sponge counts were reported as correct and there were no complications appreciated during the procedure. The patient was allowed to wake up and taken to recovery room in stable condition at the end of the case. The family was notified at the end of the case.    The advanced practice practitioner (APP) assisted throughout the  case.  The APP was essential in retraction and counter traction when needed to make the case progress smoothly.  This retraction and assistance made it possible to see the tissue plans for the procedure.  The assistance was needed for blood control, tissue re-approximation and assisted with closure of the incision site.

## 2024-10-18 NOTE — Progress Notes (Signed)
 Last received oxycodone  time written on dc paperwork.

## 2024-10-19 ENCOUNTER — Ambulatory Visit: Admitting: Physician Assistant

## 2024-10-19 ENCOUNTER — Encounter (HOSPITAL_BASED_OUTPATIENT_CLINIC_OR_DEPARTMENT_OTHER): Payer: Self-pay | Admitting: Plastic Surgery

## 2024-10-19 VITALS — BP 97/68 | HR 70

## 2024-10-19 DIAGNOSIS — Z9889 Other specified postprocedural states: Secondary | ICD-10-CM

## 2024-10-19 LAB — SURGICAL PATHOLOGY

## 2024-10-19 NOTE — Progress Notes (Signed)
 Patient is a pleasant 58 year old female s/p bilateral breast reduction performed 10/18/2024 by Dr. Waddell who presents to clinic for postoperative follow-up.  Reviewed operative report and 502 g removed from the right breast, 546 g removed from the left breast.  Today, she is doing well from postoperative standpoint.  She is accompanied by her mother-in-law at bedside.  She denies any pain.  She has been ambulatory at home.  Tolerating p.o. intake without difficulty.  Voiding urine and BM.  Denies any fevers, chest pain, difficulty breathing, or leg swelling.  Overall quite pleased.  Drain output has been minimal.  Approximately 40 cc over 24-hour period from left side and 20 cc over 24-hour period from the right side.    On exam, breasts have excellent shape and symmetry.  NAC's are warm, perfused.  Assessment to light touch and with appropriate capillary refill.  Breasts are throughout and without any palpable underlying fluid collections.  Ecchymoses over right pedicle, but not unusual after reduction surgery.  No asymmetric swelling.  Incisions are CDI throughout with Dermabond in place.  Drains removed without complication or difficulty, well-tolerated by patient.  Replaced ABD pads.  Continue with activity modifications and compressive garments.  Follow-up as scheduled.  Patient understands to call the office should she have any questions or concerns in interim.  Pictures at next visit.

## 2024-10-21 ENCOUNTER — Other Ambulatory Visit (HOSPITAL_COMMUNITY): Payer: Self-pay

## 2024-10-24 ENCOUNTER — Ambulatory Visit: Admitting: Plastic Surgery

## 2024-10-24 DIAGNOSIS — Z9889 Other specified postprocedural states: Secondary | ICD-10-CM

## 2024-10-24 NOTE — Progress Notes (Signed)
 Melissa Santiago returns today approximately 6 days postop from bilateral breast reduction.  She is doing exceedingly well.  She states that she never took any pain medicine and is never had any significant discomfort.  She is very happy with the results.  On examination she has good shape and symmetry but has a significant amount of ecchymoses bilaterally.  Nipples are warm and well-perfused and all incisions are clean dry and intact.  Status post bilateral breast reduction: Doing very well.  May slowly increase activity as tolerated.  Continue supportive compressive garments for full 6 weeks.  Keep scheduled follow-up appointment.
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
## 2024-10-28 ENCOUNTER — Ambulatory Visit: Admitting: Primary Care

## 2024-10-28 ENCOUNTER — Telehealth: Payer: Self-pay

## 2024-10-28 NOTE — Telephone Encounter (Signed)
 ATC pt X2. Unable to lvm as mb is full. Pt has a scheduled mychart video visit to review CPAP compliance with Almarie Ferrari, NP at 8:30 am.

## 2024-11-01 ENCOUNTER — Other Ambulatory Visit (HOSPITAL_COMMUNITY): Payer: Self-pay

## 2024-11-02 ENCOUNTER — Encounter: Admitting: Physician Assistant

## 2024-11-03 ENCOUNTER — Encounter: Admitting: Student

## 2024-11-07 ENCOUNTER — Other Ambulatory Visit (HOSPITAL_COMMUNITY): Payer: Self-pay

## 2024-11-08 ENCOUNTER — Ambulatory Visit: Admitting: Internal Medicine

## 2024-11-08 ENCOUNTER — Encounter: Payer: Self-pay | Admitting: Internal Medicine

## 2024-11-08 VITALS — BP 100/62 | HR 97 | Temp 97.5°F | Resp 17 | Ht 64.5 in | Wt 180.1 lb

## 2024-11-08 DIAGNOSIS — F1011 Alcohol abuse, in remission: Secondary | ICD-10-CM

## 2024-11-08 DIAGNOSIS — L299 Pruritus, unspecified: Secondary | ICD-10-CM

## 2024-11-08 DIAGNOSIS — L281 Prurigo nodularis: Secondary | ICD-10-CM

## 2024-11-08 DIAGNOSIS — D6859 Other primary thrombophilia: Secondary | ICD-10-CM

## 2024-11-08 MED ORDER — FAMOTIDINE 40 MG PO TABS: 40.0000 mg | ORAL_TABLET | Freq: Every day | ORAL | 1 refills | Status: AC

## 2024-11-08 NOTE — Patient Instructions (Addendum)
 Generalized pruritus with Prurigo nodularis Start Allegra (fexofenadine) 180 mg: 2 tabs in a.m. and 2 tabs in p.m. Start Pepcid  (famotidine ) 40 mg daily  Pruritus lab work up to date except for TSH will repeat this Continue use of hypoallergenic products Continue gentle skin care as below Information given on Nemluvio  Follow up: Allergy test Wednesday 11/17/23 (1-68)  Skin Care Recommendations  Daily Care For Maintenance  - Use hypoallergenic hydrating ointment at least twice daily.  This must be done daily for control of flares. (Great options include Vaseline, CeraVe, Aquaphor, Aveeno, Cetaphil, VaniCream, etc) - Avoid detergents, soaps or lotions with fragrances/dyes - Limit showers/baths to 5 minutes and use luke warm water instead of hot, pat dry following baths, and apply moisturizer

## 2024-11-08 NOTE — Progress Notes (Unsigned)
 "  NEW PATIENT Date of Service/Encounter:  11/08/2024 Referring provider: Daryl Setter, NP Primary care provider: Daryl Setter, NP  Subjective:  Melissa Santiago is a 59 y.o. female  presenting today for evaluation of chronic itch  History obtained from: chart review and patient.   Discussed the use of AI scribe software for clinical note transcription with the patient, who gave verbal consent to proceed.  History of Present Illness Melissa Santiago is a 59 year old female who presents with chronic itch without rash.  Chronic pruritus without rash - Persistent pruritus for over four years, described as deep and unsatisfying - Occurs daily, most noticeable at night when settling in bed - Pruritus can be localized to certain areas but is sometimes generalized - No associated rash, but scratching leads to skin tearing and redness, and hyperpigmented papules  - Severity of itching leads to excoriations - No relief with frequent linen changes or use of hypoallergenic fabric softeners - Trialed Zyrtec during the day and Benadryl at night; unclear if these provide pruritus relief or only aid sleep - Used high-end skin calming lotions (e.g., CeraVe) without improvement  Postoperative incision site pruritus - Recent breast reduction and panniculectomy - Significant pruritus at incision sites, more bothersome than pain - Avoids opioids due to intolerable pruritus as a side effect  Family history of pruritus - Mother and sisters experience similar chronic pruritus  Hepatic and alcohol use history - History of heavy wine consumption, 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 - Developed digestive issues and a liver lesion during period of heavy alcohol use - Previously abnormal liver function tests, now normalized - No concerns for hepatitis B or C  Protein s deficiency - Protein S deficiency requiring anticoagulation management around surgical procedures  Travel history - Travel to [place],  typically staying at resorts  Functional impact and medication considerations - Works from home on a laptop - Concerned about medications that may cause drowsiness due to need for focus at work      Other allergy screening: Asthma: no Rhino conjunctivitis: no Food allergy: no Medication allergy: no Hymenoptera allergy: no Urticaria: chronic itch with PN  Eczema:no History of recurrent infections suggestive of immunodeficency: no Vaccinations are up to date.   Past Medical History: Past Medical History:  Diagnosis Date   Acute lumbar radiculopathy 06/29/2013   Chest pain in adult 05/24/2018   Chronic constipation 05/24/2018   Gastroesophageal reflux disease 05/24/2018   GERD (gastroesophageal reflux disease)    Left sided sciatica 05/10/2013   Lumbar disc herniation 06/29/2013   Obesity    Osteopenia 03/23/2015   Protein S deficiency    S/P bariatric surgery 06/30/2013   Sleep apnea    Medication List:  Current Outpatient Medications  Medication Sig Dispense Refill   buPROPion  (WELLBUTRIN  XL) 150 MG 24 hr tablet TAKE 1 TABLET BY MOUTH EVERY DAY 90 tablet 1   cyanocobalamin  (VITAMIN B12) 1000 MCG/ML injection INJECT 1ML INTO MUSCLE ONCE WEEKLY FOR 4 WEEKS, THEN 1 ML ONCE MONTHLY 6 mL 1   DULoxetine  (CYMBALTA ) 60 MG capsule Take 1 capsule (60 mg total) by mouth daily. 90 capsule 0   folic acid  (FOLVITE ) 1 MG tablet Take 1 tablet (1 mg total) by mouth daily. 30 tablet 11   hydrOXYzine  (VISTARIL ) 25 MG capsule TAKE 1 CAPSULE (25 MG TOTAL) BY MOUTH EVERY 8 (EIGHT) HOURS AS NEEDED. 30 capsule 2   Menthol, Topical Analgesic, 4 % GEL Apply 1 Application topically as needed.  NEEDLE, DISP, 25 G (B-D DISP NEEDLE 25GX1) 25G X 1 MISC For B12 inj 50 each 1   nystatin  powder Apply twice daily as needed to skin fold 60 g 1   rivaroxaban  (XARELTO ) 10 MG TABS tablet Take 1 tablet (10 mg total) by mouth daily. Start taking 1 week prior to surgery. Stop 2 days before surgery and  restart the day after for 1 month. 30 tablet 1   valACYclovir  (VALTREX ) 500 MG tablet Take 1 tablet (500 mg total) by mouth daily. 7 tablet 0   No current facility-administered medications for this visit.   Known Allergies:  Allergies[1] Past Surgical History: Past Surgical History:  Procedure Laterality Date   BREAST BIOPSY Left 2014   BREAST REDUCTION SURGERY Bilateral 10/18/2024   Procedure: MAMMOPLASTY, REDUCTION;  Surgeon: Waddell Leonce NOVAK, MD;  Location: [facility];  Service: Plastics;  Laterality: Bilateral;   CHOLECYSTECTOMY  2022   LAPAROSCOPIC GASTRIC SLEEVE RESECTION  2014   LUMBAR DISC SURGERY  2014/2016   PANNICULECTOMY N/A 07/04/2024   Procedure: PANNICULECTOMY;  Surgeon: Waddell Leonce NOVAK, MD;  Location: [facility];  Service: Plastics;  Laterality: N/A;   Family History: Family History  Problem Relation Age of Onset   Arthritis Mother    Early death Mother    Mental illness Mother    Alcohol abuse Father    Arthritis Father    Thyroid  cancer Sister        papillary thyroid  cancer   Hypertension Sister    Uterine cancer Sister    Hypertension Sister    Cervical cancer Sister    Depression Sister    Hypertension Sister    Heart attack Maternal Grandfather    Colon cancer Neg Hx    Esophageal cancer Neg Hx    Breast cancer Neg Hx    Rectal cancer Neg Hx    Stomach cancer Neg Hx    Social History: [place] lives single-family home built in [place].  No water damage.  Within family room carpet in bedroom.  Gas heating central cooling.  3 dogs with access to bedroom.  No roaches in bed is 2 feet off floor.  No dust mite precautions.  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   ROS:  All other systems negative except as noted per HPI.  Objective:  Last menstrual period 01/12/2019. There is no height or weight on file to calculate BMI. Physical Exam:  General Appearance:  Alert, cooperative, no distress, appears stated age,  scratching   Head:  Normocephalic, without obvious abnormality, atraumatic  Eyes:  Conjunctiva clear, EOM's intact  Ears EACs normal bilaterally  Nose: Nares normal, no rhinorrhea  Throat: Lips, tongue normal; teeth and gums normal, moist mucous membranes  Neck: Supple, symmetrical  Lungs:    , Respirations unlabored, no coughing  Heart:  regular rate and rhythm and no murmur, Appears well perfused  Extremities: No edema  Skin: Hyperpigmented nodules on arm and back, evidence of prior excoriation mark, healing incision marks on breasts and Skin color, texture, turgor normal  Neurologic: No gross deficits   Diagnostics: None done    Labs:  Lab Orders  No laboratory test(s) ordered today     Assessment and Plan  Assessment and Plan Assessment & Plan Chronic prurigo nodularis with generalized pruritus Chronic pruritus for over four years, primarily affecting feet, knees, and chest. No improvement with current antihistamines. Family history of chronic itch. Differential includes histaminergic and non-histaminergic itch.  Recent liver function normalized (elevated from prior alcohol use, now sober). Discussed potential use of Nimluvio if antihistamines fail, noting its targeted action on IL-31 and minimal side effects. Emphasized avoiding opioids and NSAIDs. Discussed itch-scratch cycle and gentle skin care. - Ordered TSH test to evaluate thyroid  function. - Discontinued Benadryl due to cognitive effects. Generalized pruritus with Prurigo nodularis Start Allegra (fexofenadine) 180 mg: 2 tabs in a.m. and 2 tabs in p.m. Start Pepcid  (famotidine ) 40mg  daily  Pruritus lab work up to date except for TSH will repeat this Continue use of hypoallergenic products Continue gentle skin care as below Information given on Nemluvio  Follow up: Allergy test Wednesday 11/17/23 (1-68)  Skin Care Recommendations  Daily Care For Maintenance  - Use hypoallergenic hydrating ointment at least twice  daily.  This must be done daily for control of flares. (Great options include Vaseline, CeraVe, Aquaphor, Aveeno, Cetaphil, VaniCream, etc) - Avoid detergents, soaps or lotions with fragrances/dyes - Limit showers/baths to 5 minutes and use luke warm water instead of hot, pat dry following baths, and apply moisturizer      Follow up: Allergy testing Wednesday 1:30  (1-68) Follow up: in clinic 4 weeks after that   This note in its entirety was forwarded to the Provider who requested this consultation.  Other:    Thank you for your kind referral. I appreciate the opportunity to take part in [facility] care. Please do not hesitate to contact me with questions.  Sincerely,  Thank you so much for letting me partake in your care today.  Don't hesitate to reach out if you have any additional concerns!  Hargis Springer, MD  [facility] [place] [place]          [1]  Allergies Allergen Reactions   Tolmetin Hives, Itching and Rash   "

## 2024-11-15 ENCOUNTER — Ambulatory Visit: Admitting: Primary Care

## 2024-11-16 ENCOUNTER — Ambulatory Visit: Admitting: Internal Medicine

## 2024-11-16 ENCOUNTER — Encounter: Admitting: Physician Assistant

## 2024-11-16 DIAGNOSIS — L281 Prurigo nodularis: Secondary | ICD-10-CM

## 2024-11-16 DIAGNOSIS — L308 Other specified dermatitis: Secondary | ICD-10-CM

## 2024-11-16 MED ORDER — NEMOLIZUMAB-ILTO 30 MG ~~LOC~~ AUIJ
30.0000 mg | AUTO-INJECTOR | Freq: Once | SUBCUTANEOUS | Status: AC
  Administered 2024-11-16: 30 mg via SUBCUTANEOUS

## 2024-11-16 NOTE — Progress Notes (Deleted)
 Subjective:    Patient ID: Melissa Santiago is a 59 y.o. female.  Chief Complaint: {YEP:80695} {Additional complaints:19314} Environmental History: {environmental [phone]} {Common ambulatory SmartLinks:19316:: }  Review of Systems  Objective:  Physical Exam  Laboratory:  {Allergy  studies:[phone]}  Assessment:   {Assessment:19306}  Plan:   {Plan:19305}

## 2024-11-16 NOTE — Progress Notes (Signed)
 " Date of Service/Encounter:  11/16/24  Allergy  testing appointment   Initial visit on 11/08/24, seen for pruritic dermatitis .  Please see that note for additional details.  Today reports for allergy  diagnostic testing:    DIAGNOSTICS:  Skin Testing: Environmental allergy  panel and select foods. Adequate positive and negative controls Results discussed with patient/family.  Airborne Adult Perc - 11/16/24 1332     Time Antigen Placed 0135    Allergen Manufacturer Jestine    Location Back    Number of Test 55    1. Control-Buffer 50% Glycerol Negative    2. Control-Histamine 3+    3. Bahia Negative    4. Bermuda Negative    5. Johnson Negative    6. [place]  Blue Negative    7. Meadow Fescue Negative    8. Perennial Rye Negative    9. Timothy Negative    10. Ragweed Mix Negative    11. Cocklebur Negative    12. Plantain,  English Negative    13. Baccharis Negative    14. Dog Fennel Negative    15. Russian Thistle Negative    16. Lamb's Quarters Negative    17. Sheep Sorrell Negative    18. Rough Pigweed Negative    19. Marsh Elder, Rough Negative    20. Mugwort, Common Negative    21. Box, Elder Negative    22. Cedar, red Negative    23. Sweet Gum Negative    24. Pecan Pollen Negative    25. Pine Mix Negative    26. Walnut, Black Pollen Negative    27. Red Mulberry Negative    28. Ash Mix Negative    29. Birch Mix Negative    30. Beech American Negative    31. Cottonwood, Eastern Negative    32. Hickory, White Negative    33. Maple Mix Negative    34. Oak, Eastern Mix Negative    35. Sycamore Eastern Negative    36. Alternaria Alternata Negative    37. Cladosporium Herbarum Negative    38. Aspergillus Mix Negative    39. Penicillium Mix Negative    40. Bipolaris Sorokiniana (Helminthosporium) Negative    41. Drechslera Spicifera (Curvularia) Negative    42. Mucor Plumbeus Negative    43. Fusarium Moniliforme Negative    44. Aureobasidium Pullulans  (pullulara) Negative    45. Rhizopus Oryzae Negative    46. Botrytis Cinera Negative    47. Epicoccum Nigrum Negative    48. Phoma Betae Negative    49. Dust Mite Mix Negative    50. Cat Hair 10,000 BAU/ml Negative    51.  Dog Epithelia Negative    52. Mixed Feathers Negative    53. Horse Epithelia Negative    54. Cockroach, German Negative    55. Tobacco Leaf Negative          13 Food Perc - 11/16/24 1333       Test Information   Time Antigen Placed 1335    Allergen Manufacturer Jestine    Location Back    Number of allergen test 13      Food   1. Peanut Negative    2. Soybean Negative    3. Wheat Negative    4. Sesame Negative    5. Milk, Cow Negative    6. Casein Negative    7. Egg White, Chicken Negative    8. Shellfish Mix Negative    9. Fish Mix Negative    10. Cashew Negative  11. Walnut Food Negative    12. Almond Negative    13. Hazelnut Negative          Allergy  testing results were read and interpreted by myself, documented by clinical staff.  Patient has failed topical steroids and antihistamines for pruirgo nodularis.  Plan to start nemluvio  30mg  every 4 weeks  Sample given today.   Patient provided with copy of allergy  testing along with avoidance measures when indicated.   Hargis Springer, MD  [facility]        "

## 2024-11-16 NOTE — Progress Notes (Deleted)
 Subjective:    Patient ID: Melissa Santiago is a 59 y.o. female.  Chief Complaint: {YEP:80695} {Additional complaints:19314} Environmental History: {environmental history:[phone]} {Common ambulatory SmartLinks:19316:: }  Review of Systems  Objective:  Physical Exam  Laboratory:  {Allergy  studies:[phone]}  Assessment:   {Assessment:19306}  Plan:   {Plan:19305}

## 2024-11-16 NOTE — Progress Notes (Signed)
 Sample nemluvio  30mg  given in clinic per Dr Lorin.  NDC 9700-3779-89 Lot number [id number] Exp 10/25/2026 Signed consent she wants to administer at home every 4 weeks

## 2024-11-21 ENCOUNTER — Encounter: Admitting: Plastic Surgery

## 2024-11-22 ENCOUNTER — Other Ambulatory Visit (HOSPITAL_COMMUNITY): Payer: Self-pay

## 2024-11-22 NOTE — Telephone Encounter (Signed)
 Drug not covered under plan

## 2024-11-23 ENCOUNTER — Other Ambulatory Visit (HOSPITAL_COMMUNITY): Payer: Self-pay

## 2024-11-23 ENCOUNTER — Encounter: Admitting: Student

## 2024-11-24 ENCOUNTER — Encounter: Admitting: Student

## 2024-11-24 NOTE — Progress Notes (Unsigned)
 Patient is a 58 year old female who underwent bilateral breast reduction with Dr. Waddell on 10/18/2024.  Patient is a little over 5 weeks postop.  She presents to the clinic today for postoperative follow-up.  Patient was last seen in the clinic on 10/24/2024.  At this visit, patient was doing well.  On exam, her breast had good shape and symmetry.  She did have significant ecchymosis bilaterally.  Nipples were warm and well-perfused.  Today,

## 2024-11-28 ENCOUNTER — Encounter: Admitting: Plastic Surgery

## 2024-12-01 ENCOUNTER — Encounter: Admitting: Student

## 2025-05-22 ENCOUNTER — Ambulatory Visit: Admitting: Family
